# Patient Record
Sex: Female | Born: 1965 | Race: White | Hispanic: No | Marital: Married | Smoking: Former smoker
Health system: Southern US, Community
[De-identification: ages and names within clinical notes are randomized; demographics above are authoritative.]

## PROBLEM LIST (undated history)

## (undated) DIAGNOSIS — E0500 Thyrotoxicosis with diffuse goiter without thyrotoxic crisis or storm: Secondary | ICD-10-CM

## (undated) DIAGNOSIS — D649 Anemia, unspecified: Secondary | ICD-10-CM

## (undated) DIAGNOSIS — E039 Hypothyroidism, unspecified: Secondary | ICD-10-CM

## (undated) HISTORY — PX: OTHER SURGICAL HISTORY: SHX169

---

## 2002-07-07 ENCOUNTER — Emergency Department (HOSPITAL_COMMUNITY): Admission: EM | Admit: 2002-07-07 | Discharge: 2002-07-08 | Payer: Self-pay

## 2011-05-03 ENCOUNTER — Emergency Department (HOSPITAL_COMMUNITY)
Admission: EM | Admit: 2011-05-03 | Discharge: 2011-05-03 | Disposition: A | Discharge status: Home / Self Care | Payer: BC Managed Care – PPO | Attending: Emergency Medicine | Admitting: Emergency Medicine

## 2011-05-03 ENCOUNTER — Other Ambulatory Visit: Payer: Self-pay

## 2011-05-03 DIAGNOSIS — M25519 Pain in unspecified shoulder: Secondary | ICD-10-CM | POA: Insufficient documentation

## 2011-05-03 DIAGNOSIS — R51 Headache: Secondary | ICD-10-CM

## 2011-05-03 DIAGNOSIS — R001 Bradycardia, unspecified: Secondary | ICD-10-CM

## 2011-05-03 DIAGNOSIS — R0789 Other chest pain: Secondary | ICD-10-CM

## 2011-05-03 DIAGNOSIS — R071 Chest pain on breathing: Secondary | ICD-10-CM | POA: Insufficient documentation

## 2011-05-03 DIAGNOSIS — I498 Other specified cardiac arrhythmias: Secondary | ICD-10-CM | POA: Insufficient documentation

## 2011-05-03 LAB — CK TOTAL AND CKMB (NOT AT ARMC): Total CK: 27 U/L (ref 7–177)

## 2011-05-03 LAB — TROPONIN I: Troponin I: <0.3 ng/mL (ref <0.30)

## 2011-05-03 MED ORDER — ONDANSETRON HCL 4 MG/2ML IJ SOLN
4.0000 mg | Freq: Once | INTRAMUSCULAR | Status: AC
  Administered 2011-05-03: 4 mg via INTRAVENOUS
  Filled 2011-05-03: qty 2

## 2011-05-03 MED ORDER — DIPHENHYDRAMINE HCL 25 MG PO CAPS
25.0000 mg | ORAL_CAPSULE | Freq: Once | ORAL | Status: AC
  Administered 2011-05-03: 25 mg via ORAL
  Filled 2011-05-03: qty 1

## 2011-05-03 MED ORDER — SODIUM CHLORIDE 0.9 % IV SOLN
Freq: Once | INTRAVENOUS | Status: AC
  Administered 2011-05-03: 16:00:00 via INTRAVENOUS

## 2011-05-03 MED ORDER — HYDROMORPHONE HCL 2 MG/ML IJ SOLN
1.0000 mg | Freq: Once | INTRAMUSCULAR | Status: AC
  Administered 2011-05-03: 2 mg via INTRAVENOUS
  Filled 2011-05-03: qty 1

## 2011-05-03 NOTE — ED Provider Notes (Signed)
History     Chief Complaint  Patient presents with  . Headache  . Chest Pain  pt also has a frontal headache.  Onset after taking 2 days of prednisone taper.  Saw her PCP 2 days ago and was also prescribed diclofenac for "tendonitis" in her L shoulder.  Chest pain is worse with palpation, deep inspiration and movement. Patient is a 45 y.o. female presenting with headaches and chest pain. The history is provided by the patient. No language interpreter was used.  Headache  This is a new problem. The current episode started 2 days ago. The problem occurs constantly. The problem has not changed since onset.The headache is associated with nothing. The pain is located in the left unilateral region. The quality of the pain is described as dull. The pain is at a severity of 7/10. The pain radiates to the left arm.  Chest Pain     History reviewed. No pertinent past medical history.  Past Surgical History  Procedure Date  . Cesarean section     History reviewed. No pertinent family history.  History  Substance Use Topics  . Smoking status: Never Smoker   . Smokeless tobacco: Not on file  . Alcohol Use: No    OB History    Grav Para Term Preterm Abortions TAB SAB Ect Mult Living                  Review of Systems  Cardiovascular: Positive for chest pain.  Neurological: Positive for headaches.    Physical Exam  BP 186/75  Pulse 67  Temp(Src) 97.8 F (36.6 C) (Oral)  Resp 18  Ht 5\' 2"  (1.575 m)  Wt 136 lb (61.689 kg)  BMI 24.87 kg/m2  SpO2 100%  LMP 04/26/2011  Physical Exam  Constitutional: She is oriented to person, place, and time. She appears well-nourished. No distress.  HENT:  Head: Normocephalic and atraumatic.  Eyes: EOM are normal. Pupils are equal, round, and reactive to light. Right eye exhibits no discharge. Left eye exhibits no discharge.  Cardiovascular: Normal rate, regular rhythm, normal heart sounds and intact distal pulses.  Exam reveals no friction  rub.   No murmur heard.      Sinus bradycardia  Pulmonary/Chest: Effort normal and breath sounds normal. No respiratory distress. She has no wheezes. She has no rales. She exhibits no tenderness.       Pain L of sternum with deep inspiration, movement and palpation.  Neurological: She is alert and oriented to person, place, and time. She has normal reflexes. No cranial nerve deficit. Coordination normal.  Skin: She is not diaphoretic.    ED Course  Procedures  MDM   ED ECG REPORT   Date: 05/03/2011 1456  EKG Time Rate 54  Rhythm: sinus bradycardia   Axis:nl  Intervals:none  ST&T Change  none  Narrative Interpretation:   sinus bradycardia       Worthy Rancher, PA 05/03/11 1606  Worthy Rancher, PA 05/03/11 1626  Worthy Rancher, [place] 05/03/11 1845

## 2011-05-03 NOTE — ED Notes (Signed)
Pt presents with headache, chest pain, numbness in left side of face and arm. Pt recently stopped taking medication prescribed by PMD. No facial droop or weakness noted at this time.

## 2011-05-10 NOTE — ED Provider Notes (Signed)
Medical screening examination/treatment/procedure(s) were performed by non-physician practitioner and as supervising physician I was immediately available for consultation/collaboration. Jahziel Sinn, MD, FACEP   Aariya Ferrick L Charbel Los, MD 05/10/11 1712 

## 2011-12-05 ENCOUNTER — Other Ambulatory Visit (HOSPITAL_COMMUNITY): Payer: Self-pay | Admitting: Endocrinology

## 2011-12-05 DIAGNOSIS — E0500 Thyrotoxicosis with diffuse goiter without thyrotoxic crisis or storm: Secondary | ICD-10-CM

## 2011-12-12 ENCOUNTER — Ambulatory Visit (HOSPITAL_COMMUNITY)
Admission: RE | Admit: 2011-12-12 | Discharge: 2011-12-12 | Disposition: A | Discharge status: Home / Self Care | Payer: BC Managed Care – PPO | Source: Ambulatory Visit | Attending: Endocrinology | Admitting: Endocrinology

## 2011-12-12 DIAGNOSIS — E0500 Thyrotoxicosis with diffuse goiter without thyrotoxic crisis or storm: Secondary | ICD-10-CM

## 2011-12-12 DIAGNOSIS — E0590 Thyrotoxicosis, unspecified without thyrotoxic crisis or storm: Secondary | ICD-10-CM | POA: Insufficient documentation

## 2011-12-12 MED ORDER — SODIUM IODIDE I 131 CAPSULE
20.4000 | Freq: Once | INTRAVENOUS | Status: AC | PRN
  Administered 2011-12-12: 20.4 via ORAL

## 2013-06-28 ENCOUNTER — Emergency Department (HOSPITAL_COMMUNITY): Payer: Worker's Compensation

## 2013-06-28 ENCOUNTER — Encounter (HOSPITAL_COMMUNITY): Payer: Self-pay | Admitting: *Deleted

## 2013-06-28 ENCOUNTER — Emergency Department (HOSPITAL_COMMUNITY)
Admission: EM | Admit: 2013-06-28 | Discharge: 2013-06-28 | Disposition: A | Discharge status: Home / Self Care | Payer: Worker's Compensation | Attending: Emergency Medicine | Admitting: Emergency Medicine

## 2013-06-28 DIAGNOSIS — X58XXXA Exposure to other specified factors, initial encounter: Secondary | ICD-10-CM | POA: Insufficient documentation

## 2013-06-28 DIAGNOSIS — S9780XA Crushing injury of unspecified foot, initial encounter: Secondary | ICD-10-CM | POA: Insufficient documentation

## 2013-06-28 DIAGNOSIS — Z23 Encounter for immunization: Secondary | ICD-10-CM | POA: Insufficient documentation

## 2013-06-28 DIAGNOSIS — S91109A Unspecified open wound of unspecified toe(s) without damage to nail, initial encounter: Secondary | ICD-10-CM | POA: Insufficient documentation

## 2013-06-28 DIAGNOSIS — Y9289 Other specified places as the place of occurrence of the external cause: Secondary | ICD-10-CM | POA: Insufficient documentation

## 2013-06-28 DIAGNOSIS — Z79899 Other long term (current) drug therapy: Secondary | ICD-10-CM | POA: Insufficient documentation

## 2013-06-28 DIAGNOSIS — Y990 Civilian activity done for income or pay: Secondary | ICD-10-CM | POA: Insufficient documentation

## 2013-06-28 DIAGNOSIS — S91209A Unspecified open wound of unspecified toe(s) with damage to nail, initial encounter: Secondary | ICD-10-CM

## 2013-06-28 DIAGNOSIS — E0500 Thyrotoxicosis with diffuse goiter without thyrotoxic crisis or storm: Secondary | ICD-10-CM | POA: Insufficient documentation

## 2013-06-28 DIAGNOSIS — Y939 Activity, unspecified: Secondary | ICD-10-CM | POA: Insufficient documentation

## 2013-06-28 DIAGNOSIS — D649 Anemia, unspecified: Secondary | ICD-10-CM | POA: Insufficient documentation

## 2013-06-28 DIAGNOSIS — S9781XA Crushing injury of right foot, initial encounter: Secondary | ICD-10-CM

## 2013-06-28 HISTORY — DX: Thyrotoxicosis with diffuse goiter without thyrotoxic crisis or storm: E05.00

## 2013-06-28 HISTORY — DX: Anemia, unspecified: D64.9

## 2013-06-28 MED ORDER — HYDROCODONE-ACETAMINOPHEN 5-325 MG PO TABS: 1.0000 | ORAL_TABLET | ORAL | Status: DC | PRN

## 2013-06-28 MED ORDER — IBUPROFEN 600 MG PO TABS: 600.0000 mg | ORAL_TABLET | Freq: Four times a day (QID) | ORAL | Status: DC | PRN

## 2013-06-28 MED ORDER — TETANUS-DIPHTH-ACELL PERTUSSIS 5-2.5-18.5 LF-MCG/0.5 IM SUSP
0.5000 mL | Freq: Once | INTRAMUSCULAR | Status: AC
  Administered 2013-06-28: 0.5 mL via INTRAMUSCULAR
  Filled 2013-06-28: qty 0.5

## 2013-06-28 NOTE — ED Notes (Signed)
Pt had her right foot ran over by a pallet jack while at work this am, pt c/o pain to last three toes on right foot and top of right foot, good cap refill noted.

## 2013-07-03 NOTE — ED Provider Notes (Signed)
CSN: [id number]     Arrival date & time 06/28/13  0919 History   First MD Initiated Contact with Patient 06/28/13 1014     Chief Complaint  Patient presents with  . Foot Injury   (Consider location/radiation/quality/duration/timing/severity/associated sxs/prior Treatment) HPI Comments: Kristy Abbott is a 47 y.o. Female presenting with right foot injury and pain after a pallet jack ran over her foot at work this morning just prior to arrival.  The jack ran over the very tip of her foot, causing pain to her 3rd, 4th and 5th toes.  She also has injury to the nail plate of her 3rd toe.  She can wiggle her toes with worsening pain.  She denies other injury.  She has had no treatments prior to arrival.      The history is provided by the patient.    Past Medical History  Diagnosis Date  . Graves disease   . Anemia    Past Surgical History  Procedure Laterality Date  . Cesarean section     No family history on file. History  Substance Use Topics  . Smoking status: Never Smoker   . Smokeless tobacco: Not on file  . Alcohol Use: No   OB History   Grav Para Term Preterm Abortions TAB SAB Ect Mult Living                 Review of Systems  Constitutional: Negative for fever.  Musculoskeletal: Positive for arthralgias. Negative for myalgias and joint swelling.  Neurological: Negative for weakness and numbness.    Allergies  Review of patient's allergies indicates no known allergies.  Home Medications   Current Outpatient Rx  Name  Route  Sig  Dispense  Refill  . ferrous sulfate 325 (65 FE) MG tablet   Oral   Take 325 mg by mouth daily with breakfast.         . levothyroxine (SYNTHROID, LEVOTHROID) 100 MCG tablet   Oral   Take 100 mcg by mouth daily before breakfast.         . HYDROcodone-acetaminophen (NORCO/VICODIN) 5-325 MG per tablet   Oral   Take 1 tablet by mouth every 4 (four) hours as needed for pain.   15 tablet   0   . ibuprofen (ADVIL,MOTRIN) 600 MG  tablet   Oral   Take 1 tablet (600 mg total) by mouth every 6 (six) hours as needed for pain.   20 tablet   0    BP 151/81  Pulse 72  Temp(Src) 99.2 F (37.3 C) (Oral)  Resp 20  Ht 5\' 2"  (1.575 m)  Wt 145 lb (65.772 kg)  BMI 26.51 kg/m2  SpO2 98%  LMP 06/28/2013 Physical Exam  Constitutional: She appears well-developed and well-nourished.  HENT:  Head: Atraumatic.  Neck: Normal range of motion.  Cardiovascular:  Pulses:      Dorsalis pedis pulses are 2+ on the right side, and 2+ on the left side.  Pulses equal bilaterally  Musculoskeletal: She exhibits tenderness. She exhibits no edema.       Feet:  Neurological: She is alert. She has normal strength. She displays normal reflexes. No sensory deficit.  Equal strength  Skin: Skin is warm and dry.  3rd right toe nail plate completely avulsed,  Hanging by a small flap of cuticle.  Hemostatic. No laceration to nail bed. Less than 3 sec cap refill.  Psychiatric: She has a normal mood and affect.    ED Course  Procedures (  including critical care time)  Nail plate was removed without difficulty.   Nail bed was covered with xeroform after cleansing with wound spray,  Saline.  Dressing applied.   Labs Review Labs Reviewed - No data to display Imaging Review No results found.  MDM   1. Crush injury to foot, right, initial encounter   2. Nail avulsion of toe, initial encounter    Post op shoe,  Bulky dressing applied.  Hydrocodone, ibuprofen, ice, elevation encouraged for next few days.  Pt's tetanus was also updated.  Prn f/u with pcp recommended.  Patients labs and/or radiological studies were viewed and considered during the medical decision making and disposition process.     Burgess Amor, PA-C 07/03/13 [phone]

## 2013-07-04 NOTE — ED Provider Notes (Signed)
Medical screening examination/treatment/procedure(s) were performed by non-physician practitioner and as supervising physician I was immediately available for consultation/collaboration.   Benny Lennert, MD 07/04/13 1325

## 2014-01-19 ENCOUNTER — Emergency Department (HOSPITAL_COMMUNITY)
Admission: EM | Admit: 2014-01-19 | Discharge: 2014-01-19 | Disposition: A | Discharge status: Home / Self Care | Payer: BC Managed Care – PPO | Attending: Emergency Medicine | Admitting: Emergency Medicine

## 2014-01-19 ENCOUNTER — Emergency Department (HOSPITAL_COMMUNITY): Payer: BC Managed Care – PPO

## 2014-01-19 ENCOUNTER — Encounter (HOSPITAL_COMMUNITY): Payer: Self-pay | Admitting: Emergency Medicine

## 2014-01-19 DIAGNOSIS — R0602 Shortness of breath: Secondary | ICD-10-CM | POA: Insufficient documentation

## 2014-01-19 DIAGNOSIS — Z862 Personal history of diseases of the blood and blood-forming organs and certain disorders involving the immune mechanism: Secondary | ICD-10-CM | POA: Insufficient documentation

## 2014-01-19 DIAGNOSIS — Z792 Long term (current) use of antibiotics: Secondary | ICD-10-CM | POA: Insufficient documentation

## 2014-01-19 DIAGNOSIS — Z8619 Personal history of other infectious and parasitic diseases: Secondary | ICD-10-CM | POA: Insufficient documentation

## 2014-01-19 DIAGNOSIS — R51 Headache: Secondary | ICD-10-CM | POA: Insufficient documentation

## 2014-01-19 DIAGNOSIS — F419 Anxiety disorder, unspecified: Secondary | ICD-10-CM

## 2014-01-19 DIAGNOSIS — F411 Generalized anxiety disorder: Secondary | ICD-10-CM | POA: Insufficient documentation

## 2014-01-19 LAB — CBC WITH DIFFERENTIAL/PLATELET
BASOS PCT: 0 % (ref 0–1)
Basophils Absolute: 0 10*3/uL (ref 0.0–0.1)
EOS ABS: 0.1 10*3/uL (ref 0.0–0.7)
Eosinophils Relative: 1 % (ref 0–5)
HEMATOCRIT: 32.6 % — AB (ref 36.0–46.0)
HEMOGLOBIN: 10.3 g/dL — AB (ref 12.0–15.0)
LYMPHS ABS: 2.2 10*3/uL (ref 0.7–4.0)
Lymphocytes Relative: 32 % (ref 12–46)
MCH: 25.1 pg — AB (ref 26.0–34.0)
MCHC: 31.6 g/dL (ref 30.0–36.0)
MCV: 79.5 fL (ref 78.0–100.0)
MONO ABS: 0.4 10*3/uL (ref 0.1–1.0)
MONOS PCT: 5 % (ref 3–12)
Neutro Abs: 4.2 10*3/uL (ref 1.7–7.7)
Neutrophils Relative %: 62 % (ref 43–77)
Platelets: 315 10*3/uL (ref 150–400)
RBC: 4.1 MIL/uL (ref 3.87–5.11)
RDW: 15 % (ref 11.5–15.5)
WBC: 6.8 10*3/uL (ref 4.0–10.5)

## 2014-01-19 LAB — COMPREHENSIVE METABOLIC PANEL
ALBUMIN: 4.8 g/dL (ref 3.5–5.2)
ALK PHOS: 69 U/L (ref 39–117)
ALT: 10 U/L (ref 0–35)
AST: 16 U/L (ref 0–37)
BILIRUBIN TOTAL: 0.4 mg/dL (ref 0.3–1.2)
BUN: 9 mg/dL (ref 6–23)
CO2: 24 mEq/L (ref 19–32)
CREATININE: 0.56 mg/dL (ref 0.50–1.10)
Calcium: 10.1 mg/dL (ref 8.4–10.5)
Chloride: 104 mEq/L (ref 96–112)
GFR calc non Af Amer: >90 mL/min (ref >90)
GLUCOSE: 95 mg/dL (ref 70–99)
Potassium: 4.4 mEq/L (ref 3.7–5.3)
Sodium: 141 mEq/L (ref 137–147)
TOTAL PROTEIN: 9 g/dL — AB (ref 6.0–8.3)

## 2014-01-19 LAB — TROPONIN I

## 2014-01-19 MED ORDER — LORAZEPAM 0.5 MG PO TABS: 1.0000 mg | ORAL_TABLET | Freq: Three times a day (TID) | ORAL | Status: DC | PRN

## 2014-01-19 MED ORDER — LORAZEPAM 2 MG/ML IJ SOLN
0.5000 mg | Freq: Once | INTRAMUSCULAR | Status: AC
  Administered 2014-01-19: 0.5 mg via INTRAVENOUS
  Filled 2014-01-19: qty 1

## 2014-01-19 MED ORDER — SODIUM CHLORIDE 0.9 % IV BOLUS (SEPSIS)
1000.0000 mL | Freq: Once | INTRAVENOUS | Status: AC
  Administered 2014-01-19: 1000 mL via INTRAVENOUS

## 2014-01-19 NOTE — ED Notes (Signed)
Stills c/o mild dizziness

## 2014-01-19 NOTE — ED Notes (Signed)
Pt c/o ha/dizziness and sob since 10 am.

## 2014-01-19 NOTE — ED Provider Notes (Signed)
CSN: [id number]     Arrival date & time 01/19/14  1746 History  This chart was scribed for Benny Lennert, MD by Ardelia Mems, ED Scribe. This patient was seen in room [facility] and the patient's care was started at 7:11 PM.   Chief Complaint  Patient presents with  . Dizziness    Patient is a 48 y.o. female presenting with dizziness. The history is provided by the patient. No language interpreter was used.  Dizziness Quality:  Lightheadedness Severity:  Moderate Onset quality:  Gradual Duration:  9 hours Timing:  Intermittent Progression:  Waxing and waning Chronicity:  New Relieved by:  None tried Worsened by:  Nothing tried Ineffective treatments:  None tried Associated symptoms: headaches and shortness of breath   Associated symptoms: no chest pain and no diarrhea   Risk factors: anemia     HPI Comments: Kristy Abbott is a 48 y.o. Female with a history of anemia and Grave's disease who presents to the Emergency Department complaining of intermittent dizziness onset at 10 AM this morning. She reports associated lightheadedness, headache and SOB onset this morning. She denies abdominal pain or any other pain or symptoms.   PCP- Dr. [name]   Past Medical History  Diagnosis Date  . Graves disease   . Anemia    Past Surgical History  Procedure Laterality Date  . Cesarean section     No family history on file. History  Substance Use Topics  . Smoking status: Never Smoker   . Smokeless tobacco: Not on file  . Alcohol Use: No   OB History   Grav Para Term Preterm Abortions TAB SAB Ect Mult Living                 Review of Systems  Constitutional: Negative for appetite change and fatigue.  HENT: Negative for congestion, ear discharge and sinus pressure.   Eyes: Negative for discharge.  Respiratory: Positive for shortness of breath. Negative for cough.   Cardiovascular: Negative for chest pain.  Gastrointestinal: Negative for abdominal pain and diarrhea.   Genitourinary: Negative for frequency and hematuria.  Musculoskeletal: Negative for back pain.  Skin: Negative for rash.  Neurological: Positive for dizziness, light-headedness and headaches. Negative for seizures.  Psychiatric/Behavioral: Negative for hallucinations.    Allergies  Review of patient's allergies indicates no known allergies.  Home Medications   Current Outpatient Rx  Name  Route  Sig  Dispense  Refill  . levothyroxine (SYNTHROID, LEVOTHROID) 75 MCG tablet   Oral   Take 75 mcg by mouth daily before breakfast.          Triage Vitals: BP 149/63  Pulse 72  Temp(Src) 98.5 F (36.9 C)  Resp 24  Ht 5\' 2"  (1.575 m)  Wt 135 lb (61.236 kg)  BMI 24.69 kg/m2  SpO2 100%  LMP 01/01/2014  Physical Exam  Constitutional: She is oriented to person, place, and time. She appears well-developed.  HENT:  Head: Normocephalic.  Eyes: Conjunctivae and EOM are normal. No scleral icterus.  Neck: Neck supple. No thyromegaly present.  Cardiovascular: Normal rate and regular rhythm.  Exam reveals no gallop and no friction rub.   No murmur heard. Pulmonary/Chest: No stridor. She has no wheezes. She has no rales. She exhibits no tenderness.  Abdominal: She exhibits no distension. There is no tenderness. There is no rebound.  Musculoskeletal: Normal range of motion. She exhibits no edema.  Lymphadenopathy:    She has no cervical adenopathy.  Neurological: She  is oriented to person, place, and time. She exhibits normal muscle tone. Coordination normal.  Skin: No rash noted. No erythema.  Psychiatric:  Slightly anxious.    ED Course  Procedures (including critical care time)  DIAGNOSTIC STUDIES: Oxygen Saturation is 100% on RA, normal by my interpretation.    COORDINATION OF CARE: 7:14 PM- Discussed plan to obtain diagnostic lab work and radiology. Will also order IV fluids and Ativan. Pt advised of plan for treatment and pt agrees.  Medications  sodium chloride 0.9 %  bolus 1,000 mL (1,000 mLs Intravenous New Bag/Given 01/19/14 1925)  LORazepam (ATIVAN) injection 0.5 mg (0.5 mg Intravenous Given 01/19/14 1925)   Labs Review Labs Reviewed  CBC WITH DIFFERENTIAL - Abnormal; Notable for the following:    Hemoglobin 10.3 (*)    HCT 32.6 (*)    MCH 25.1 (*)    All other components within normal limits  COMPREHENSIVE METABOLIC PANEL - Abnormal; Notable for the following:    Total Protein 9.0 (*)    All other components within normal limits  TROPONIN I   Imaging Review Dg Chest 2 View  01/19/2014   CLINICAL DATA:  Chest pain and weakness  EXAM: CHEST  2 VIEW  COMPARISON:  None.  FINDINGS: The lungs are clear and negative for focal airspace consolidation, pulmonary edema or suspicious pulmonary nodule. No pleural effusion or pneumothorax. Cardiac and mediastinal contours are within normal limits. No acute fracture or lytic or blastic osseous lesions. The visualized upper abdominal bowel gas pattern is unremarkable.  IMPRESSION: No active cardiopulmonary disease.   Electronically Signed   By: Malachy MoanHeath  McCullough M.D.   On: 01/19/2014 20:03     EKG Interpretation   Date/Time:  Thursday January 19 2014 18:02:03 EDT Ventricular Rate:  52 PR Interval:  146 QRS Duration: 84 QT Interval:  414 QTC Calculation: 385 R Axis:   74 Text Interpretation:  6 Sinus bradycardia Otherwise normal ECG No previous  ECGs available Confirmed by Crosby Oriordan  MD, Tyanna Hach [phone]) on 01/19/2014 9:03:27  PM      MDM   Final diagnoses:  None      Benny LennertJoseph L Nariah Morgano, MD 01/19/14 2103

## 2014-01-19 NOTE — Discharge Instructions (Signed)
Follow up with your md next week for recheck °

## 2018-07-15 DIAGNOSIS — E663 Overweight: Secondary | ICD-10-CM | POA: Diagnosis not present

## 2018-08-31 DIAGNOSIS — Z6827 Body mass index (BMI) 27.0-27.9, adult: Secondary | ICD-10-CM | POA: Diagnosis not present

## 2018-08-31 DIAGNOSIS — Z0000 Encounter for general adult medical examination without abnormal findings: Secondary | ICD-10-CM | POA: Diagnosis not present

## 2018-08-31 DIAGNOSIS — E039 Hypothyroidism, unspecified: Secondary | ICD-10-CM | POA: Diagnosis not present

## 2018-08-31 DIAGNOSIS — J069 Acute upper respiratory infection, unspecified: Secondary | ICD-10-CM | POA: Diagnosis not present

## 2018-08-31 DIAGNOSIS — D649 Anemia, unspecified: Secondary | ICD-10-CM | POA: Diagnosis not present

## 2018-08-31 DIAGNOSIS — Z79899 Other long term (current) drug therapy: Secondary | ICD-10-CM | POA: Diagnosis not present

## 2018-08-31 DIAGNOSIS — Z299 Encounter for prophylactic measures, unspecified: Secondary | ICD-10-CM | POA: Diagnosis not present

## 2018-09-20 ENCOUNTER — Ambulatory Visit (INDEPENDENT_AMBULATORY_CARE_PROVIDER_SITE_OTHER): Payer: Self-pay

## 2018-09-20 DIAGNOSIS — Z1211 Encounter for screening for malignant neoplasm of colon: Secondary | ICD-10-CM

## 2018-09-20 MED ORDER — NA SULFATE-K SULFATE-MG SULF 17.5-3.13-1.6 GM/177ML PO SOLN: 1.0000 | ORAL | 0 refills | Status: DC

## 2018-09-20 NOTE — Progress Notes (Signed)
Gastroenterology Pre-Procedure Review  Request Date:09/20/18 Requesting Physician: Salley SlaughterAngela Boone NP- Dr.Shah- no previous tcs  PATIENT REVIEW QUESTIONS: The patient responded to the following health history questions as indicated:    Pt is taking iron every other day.   1. Diabetes Melitis: no 2. Joint replacements in the past 12 months: no 3. Major health problems in the past 3 months: no 4. Has an artificial valve or MVP: no 5. Has a defibrillator: no 6. Has been advised in past to take antibiotics in advance of a procedure like teeth cleaning: no 7. Family history of colon cancer: no  8. Alcohol Use: no 9. History of sleep apnea: no  10. History of coronary artery or other vascular stents placed within the last 12 months: no 11. History of any prior anesthesia complications: no    MEDICATIONS & ALLERGIES:    Patient reports the following regarding taking any blood thinners:   Plavix? no Aspirin? no Coumadin? no Brilinta? no Xarelto? no Eliquis? no Pradaxa? no Savaysa? no Effient? no  Patient confirms/reports the following medications:  Current Outpatient Medications  Medication Sig Dispense Refill  . Ferrous Sulfate (SLOW FE PO) Take by mouth every other day.    . levothyroxine (SYNTHROID, LEVOTHROID) 75 MCG tablet Take 88 mcg by mouth daily before breakfast.     . omeprazole (PRILOSEC) 20 MG capsule TK ONE C PO  BID  3   No current facility-administered medications for this visit.     Patient confirms/reports the following allergies:  No Known Allergies  No orders of the defined types were placed in this encounter.   AUTHORIZATION INFORMATION Primary Insurance: [employer],  ID #: [id number] Pre-Cert / Berkley HarveyAuth required:  Pre-Cert / Auth #:    SCHEDULE INFORMATION: Procedure has been scheduled as follows:  Date: 11/10/18, Time: 1:45 Location: [facility]  This Gastroenterology Pre-Precedure Review Form is being routed to the following provider(s): Wynne DustEric Gill NP

## 2018-09-20 NOTE — Patient Instructions (Signed)
Kristy Abbott  September 28, 1966 MRN: [id number]     Procedure Date: 11/10/18 Time to register: 12:30pm Place to register: [facility] Procedure Time: 1:30pm Scheduled provider: Barney Drain, MD    PREPARATION FOR COLONOSCOPY WITH SUPREP BOWEL PREP KIT  Note: Suprep Bowel Prep Kit is a split-dose (2day) regimen. Consumption of BOTH 6-ounce bottles is required for a complete prep.  Please notify us immediately if you are diabetic, take iron supplements, or if you are on Coumadin or any other blood thinners.  Please hold the following medications: I will mail you a letter with this information                                                                                                                                                   1 DAY BEFORE PROCEDURE:  DATE: 11/09/18   DAY: Tuesday  clear liquids the entire day - NO SOLID FOOD.   At 6:00pm: Complete steps 1 through 4 below, using ONE (1) 6-ounce bottle, before going to bed. Step 1:  Pour ONE (1) 6-ounce bottle of SUPREP liquid into the mixing container.  Step 2:  Add cool drinking water to the 16 ounce line on the container and mix.  Note: Dilute the solution concentrate as directed prior to use. Step 3:  DRINK ALL the liquid in the container. Step 4:  You MUST drink an additional two (2) or more 16 ounce containers of water over the next one (1) hour.   Continue clear liquids.  DAY OF PROCEDURE:   DATE: 11/10/18  DAY: Wednesday If you take medications for your heart, blood pressure, or breathing, you may take these medications.   5 hours before your procedure at :8:30am Step 1:  Pour ONE (1) 6-ounce bottle of SUPREP liquid into the mixing container.  Step 2:  Add cool drinking water to the 16 ounce line on the container and mix.  Note: Dilute the solution concentrate as directed prior to use. Step 3:  DRINK ALL the liquid in the container. Step 4:  You MUST drink an additional two (2) or more 16 ounce containers  of water over the next one (1) hour. You MUST complete the final glass of water at least 3 hours before your colonoscopy.   Nothing by mouth past:10:30am  You may take your morning medications with sip of water unless we have instructed otherwise.    Please see below for Dietary Information.  CLEAR LIQUIDS INCLUDE:  Water Jello (NOT red in color)   Ice Popsicles (NOT red in color)   Tea (sugar ok, no milk/cream) Powdered fruit flavored drinks  Coffee (sugar ok, no milk/cream) Gatorade/ Lemonade/ Kool-Aid  (NOT red in color)   Juice: apple, white grape, white cranberry Soft drinks  Clear bullion, consomme, broth (fat free beef/chicken/vegetable)  Carbonated beverages (any kind)  Strained chicken noodle soup Hard Candy   Remember: Clear liquids are liquids that will allow you to see your fingers on the other side of a clear glass. Be sure liquids are NOT red in color, and not cloudy, but CLEAR.  DO NOT EAT OR DRINK ANY OF THE FOLLOWING:  Dairy products of any kind   Cranberry juice Tomato juice / V8 juice   Grapefruit juice Orange juice     Red grape juice  Do not eat any solid foods, including such foods as: cereal, oatmeal, yogurt, fruits, vegetables, creamed soups, eggs, bread, crackers, pureed foods in a blender, etc.   HELPFUL HINTS FOR DRINKING PREP SOLUTION:   Make sure prep is extremely cold. Mix and refrigerate the the morning of the prep. You may also put in the freezer.   You may try mixing some Crystal Light or Country Time Lemonade if you prefer. Mix in small amounts; add more if necessary.  Try drinking through a straw  Rinse mouth with water or a mouthwash between glasses, to remove after-taste.  Try sipping on a cold beverage /ice/ popsicles between glasses of prep.  Place a piece of sugar-free hard candy in mouth between glasses.  If you become nauseated, try consuming smaller amounts, or stretch out the time between glasses. Stop for 30-60 minutes, then  slowly start back drinking.     OTHER INSTRUCTIONS  You will need a responsible adult at least 50 years of age to accompany you and drive you home. This person must remain in the waiting room during your procedure. The hospital will cancel your procedure if you do not have a responsible adult with you.   1. Wear loose fitting clothing that is easily removed. 2. Leave jewelry and other valuables at home.  3. Remove all body piercing jewelry and leave at home. 4. Total time from sign-in until discharge is approximately 2-3 hours. 5. You should go home directly after your procedure and rest. You can resume normal activities the day after your procedure. 6. The day of your procedure you should not:  Drive  Make legal decisions  Operate machinery  Drink alcohol  Return to work   You may call the office (Dept: [phone]) before 5:00pm, or page the doctor on call [phone]) after 5:00pm, for further instructions, if necessary.   Insurance Information YOU WILL NEED TO CHECK WITH YOUR INSURANCE COMPANY FOR THE BENEFITS OF COVERAGE YOU HAVE FOR THIS PROCEDURE.  UNFORTUNATELY, NOT ALL INSURANCE COMPANIES HAVE BENEFITS TO COVER ALL OR PART OF THESE TYPES OF PROCEDURES.  IT IS YOUR RESPONSIBILITY TO CHECK YOUR BENEFITS, HOWEVER, WE WILL BE GLAD TO ASSIST YOU WITH ANY CODES YOUR INSURANCE COMPANY MAY NEED.    PLEASE NOTE THAT MOST INSURANCE COMPANIES WILL NOT COVER A SCREENING COLONOSCOPY FOR PEOPLE UNDER THE AGE OF 50  IF YOU HAVE BCBS INSURANCE, YOU MAY HAVE BENEFITS FOR A SCREENING COLONOSCOPY BUT IF POLYPS ARE FOUND THE DIAGNOSIS WILL CHANGE AND THEN YOU MAY HAVE A DEDUCTIBLE THAT WILL NEED TO BE MET. SO PLEASE MAKE SURE YOU CHECK YOUR BENEFITS FOR A SCREENING COLONOSCOPY AS WELL AS A DIAGNOSTIC COLONOSCOPY.

## 2018-09-21 NOTE — Progress Notes (Signed)
Ok to schedule. Hold iron for 7 days prior to TCS

## 2018-09-22 NOTE — Progress Notes (Signed)
Letter mailed to the pt to hold iron.

## 2018-09-28 DIAGNOSIS — Z1231 Encounter for screening mammogram for malignant neoplasm of breast: Secondary | ICD-10-CM | POA: Diagnosis not present

## 2018-10-19 DIAGNOSIS — E663 Overweight: Secondary | ICD-10-CM | POA: Diagnosis not present

## 2018-11-09 ENCOUNTER — Telehealth: Payer: Self-pay | Admitting: Gastroenterology

## 2018-11-09 NOTE — Telephone Encounter (Signed)
I spoke with Marylu Lund. We are working with [facility] to gets pts tcs approved.

## 2018-11-09 NOTE — Telephone Encounter (Signed)
Please call Marylu Lund at [facility] about patient and her procedure tomorrow. [phone]

## 2018-11-09 NOTE — Telephone Encounter (Signed)
[facility] and speaking with Dorothea GlassmanCarrie, Karen, [place] and finally Toney ReilDaisy and speaking with Marylu LundJanet at [facility] several times, the patients tcs has been approved. Approval number per Toney Reilaisy- [id number]- [id number]. I called and informed the pt.

## 2018-11-10 ENCOUNTER — Encounter (HOSPITAL_COMMUNITY): Payer: Self-pay | Admitting: *Deleted

## 2018-11-10 ENCOUNTER — Encounter (HOSPITAL_COMMUNITY)
Admission: RE | Disposition: A | Discharge status: Home / Self Care | Payer: Self-pay | Source: Home / Self Care | Attending: Gastroenterology

## 2018-11-10 ENCOUNTER — Other Ambulatory Visit: Payer: Self-pay

## 2018-11-10 ENCOUNTER — Ambulatory Visit (HOSPITAL_COMMUNITY)
Admission: RE | Admit: 2018-11-10 | Discharge: 2018-11-10 | Disposition: A | Discharge status: Home / Self Care | Payer: 59 | Attending: Gastroenterology | Admitting: Gastroenterology

## 2018-11-10 DIAGNOSIS — Z791 Long term (current) use of non-steroidal anti-inflammatories (NSAID): Secondary | ICD-10-CM | POA: Diagnosis not present

## 2018-11-10 DIAGNOSIS — K648 Other hemorrhoids: Secondary | ICD-10-CM | POA: Insufficient documentation

## 2018-11-10 DIAGNOSIS — D649 Anemia, unspecified: Secondary | ICD-10-CM | POA: Insufficient documentation

## 2018-11-10 DIAGNOSIS — Z1211 Encounter for screening for malignant neoplasm of colon: Secondary | ICD-10-CM | POA: Diagnosis not present

## 2018-11-10 DIAGNOSIS — K644 Residual hemorrhoidal skin tags: Secondary | ICD-10-CM | POA: Diagnosis not present

## 2018-11-10 DIAGNOSIS — Z79890 Hormone replacement therapy (postmenopausal): Secondary | ICD-10-CM | POA: Diagnosis not present

## 2018-11-10 DIAGNOSIS — Q438 Other specified congenital malformations of intestine: Secondary | ICD-10-CM | POA: Diagnosis not present

## 2018-11-10 DIAGNOSIS — Z79899 Other long term (current) drug therapy: Secondary | ICD-10-CM | POA: Diagnosis not present

## 2018-11-10 DIAGNOSIS — E039 Hypothyroidism, unspecified: Secondary | ICD-10-CM | POA: Insufficient documentation

## 2018-11-10 HISTORY — PX: COLONOSCOPY: SHX5424

## 2018-11-10 HISTORY — DX: Hypothyroidism, unspecified: E03.9

## 2018-11-10 SURGERY — COLONOSCOPY
Anesthesia: Moderate Sedation

## 2018-11-10 MED ORDER — STERILE WATER FOR IRRIGATION IR SOLN
Status: DC | PRN
  Administered 2018-11-10: 1.5 mL

## 2018-11-10 MED ORDER — MEPERIDINE HCL 100 MG/ML IJ SOLN
INTRAMUSCULAR | Status: DC | PRN
  Administered 2018-11-10 (×3): 25 mg via INTRAVENOUS

## 2018-11-10 MED ORDER — MIDAZOLAM HCL 5 MG/5ML IJ SOLN
INTRAMUSCULAR | Status: DC | PRN
  Administered 2018-11-10 (×3): 2 mg via INTRAVENOUS

## 2018-11-10 MED ORDER — SODIUM CHLORIDE 0.9 % IV SOLN
INTRAVENOUS | Status: DC
  Administered 2018-11-10: 13:00:00 via INTRAVENOUS

## 2018-11-10 MED ORDER — MIDAZOLAM HCL 5 MG/5ML IJ SOLN
INTRAMUSCULAR | Status: AC
  Filled 2018-11-10: qty 10

## 2018-11-10 MED ORDER — MEPERIDINE HCL 100 MG/ML IJ SOLN
INTRAMUSCULAR | Status: AC
  Filled 2018-11-10: qty 2

## 2018-11-10 NOTE — H&P (Signed)
Primary Care Physician:  Monico Blitz, MD Primary Gastroenterologist:  Dr. Oneida Alar  Pre-Procedure History & Physical: HPI:  Kristy Abbott is a 53 y.o. female here for  [place].   Past Medical History:  Diagnosis Date  . Anemia   . Graves disease   . Hypothyroidism     Past Surgical History:  Procedure Laterality Date  . CESAREAN SECTION    . Cyst removed from neck      Prior to Admission medications   Medication Sig Start Date End Date Taking? Authorizing Provider  levothyroxine (SYNTHROID, LEVOTHROID) 88 MCG tablet Take 88 mcg by mouth daily before breakfast.   Yes [provider]  Na Sulfate-K Sulfate-Mg Sulf (SUPREP BOWEL PREP KIT) 17.5-3.13-1.6 GM/177ML SOLN Take 1 kit by mouth as directed. 09/20/18  Yes Carlis Stable, NP  naproxen sodium (ALEVE) 220 MG tablet Take 440 mg by mouth daily as needed (pain).   Yes [provider]  omeprazole (PRILOSEC) 20 MG capsule Take 20 mg by mouth daily.  08/31/18  Yes [provider]  Ferrous Sulfate (IRON SLOW RELEASE) 142 (45 Fe) MG TBCR Take 45 mg by mouth every other day.    [provider]    Allergies as of 09/20/2018  . (No Known Allergies)    Family History  Problem Relation Age of Onset  . Colon cancer Neg Hx     Social History   Socioeconomic History  . Marital status: Married    Spouse name: Not on file  . Number of children: Not on file  . Years of education: Not on file  . Highest education level: Not on file  Occupational History  . Not on file  Social Needs  . Financial resource strain: Not on file  . Food insecurity:    Worry: Not on file    Inability: Not on file  . Transportation needs:    Medical: Not on file    Non-medical: Not on file  Tobacco Use  . Smoking status: Never Smoker  . Smokeless tobacco: Never Used  Substance and Sexual Activity  . Alcohol use: No  . Drug use: No  . Sexual activity: Yes  Lifestyle  . Physical activity:    Days  per week: Not on file    Minutes per session: Not on file  . Stress: Not on file  Relationships  . Social connections:    Talks on phone: Not on file    Gets together: Not on file    Attends religious service: Not on file    Active member of club or organization: Not on file    Attends meetings of clubs or organizations: Not on file    Relationship status: Not on file  . Intimate partner violence:    Fear of current or ex partner: Not on file    Emotionally abused: Not on file    Physically abused: Not on file    Forced sexual activity: Not on file  Other Topics Concern  . Not on file  Social History Narrative  . Not on file    Review of Systems: See HPI, otherwise negative ROS   Physical Exam: BP 136/83   Pulse 64   Temp 98.7 F (37.1 C) (Oral)   Resp 12   Ht 5' 4"  (1.626 m)   Wt 70.3 kg   LMP 11/04/2018 (Exact Date)   SpO2 100%   BMI 26.61 kg/m  General:   Alert,  pleasant and cooperative  in NAD Head:  Normocephalic and atraumatic. Neck:  Supple; Lungs:  Clear throughout to auscultation.    Heart:  Regular rate and rhythm. Abdomen:  Soft, nontender and nondistended. Normal bowel sounds, without guarding, and without rebound.   Neurologic:  Alert and  oriented x4;  grossly normal neurologically.  Impression/Plan:    SCREENING  Plan:  1. TCS TODAY DISCUSSED PROCEDURE, BENEFITS, & RISKS: < 1% chance of medication reaction, bleeding, perforation, or rupture of spleen/liver.

## 2018-11-10 NOTE — Discharge Instructions (Signed)
You have SMAL internal hemorrhoids. YOU DID NOT HAVE ANY POLYPS.   DRINK WATER TO KEEP YOUR URINE LIGHT YELLOW.  FOLLOW A HIGH FIBER DIET. AVOID ITEMS THAT CAUSE BLOATING. SEE INFO BELOW.  USE PREPARATION H FOUR TIMES  A DAY IF NEEDED TO RELIEVE RECTAL PAIN/PRESSURE/BLEEDING.  Next colonoscopy in 10 years.  Colonoscopy Care After Read the instructions outlined below and refer to this sheet in the next week. These discharge instructions provide you with general information on caring for yourself after you leave the hospital. While your treatment has been planned according to the most current medical practices available, unavoidable complications occasionally occur. If you have any problems or questions after discharge, call DR. Asia Dusenbury, [phone].  ACTIVITY  You may resume your regular activity, but move at a slower pace for the next 24 hours.   Take frequent rest periods for the next 24 hours.   Walking will help get rid of the air and reduce the bloated feeling in your belly (abdomen).   No driving for 24 hours (because of the medicine (anesthesia) used during the test).   You may shower.   Do not sign any important legal documents or operate any machinery for 24 hours (because of the anesthesia used during the test).    NUTRITION  Drink plenty of fluids.   You may resume your normal diet as instructed by your doctor.   Begin with a light meal and progress to your normal diet. Heavy or fried foods are harder to digest and may make you feel sick to your stomach (nauseated).   Avoid alcoholic beverages for 24 hours or as instructed.    MEDICATIONS  You may resume your normal medications.   WHAT YOU CAN EXPECT TODAY  Some feelings of bloating in the abdomen.   Passage of more gas than usual.   Spotting of blood in your stool or on the toilet paper  .  IF YOU HAD POLYPS REMOVED DURING THE COLONOSCOPY:  Eat a soft diet IF YOU HAVE NAUSEA, BLOATING, ABDOMINAL  PAIN, OR VOMITING.    FINDING OUT THE RESULTS OF YOUR TEST Not all test results are available during your visit. DR. Darrick Penna WILL CALL YOU WITHIN 14 DAYS OF YOUR PROCEDUE WITH YOUR RESULTS. Do not assume everything is normal if you have not heard from DR. Jovanny Stephanie, CALL HER OFFICE AT [phone].  SEEK IMMEDIATE MEDICAL ATTENTION AND CALL THE OFFICE: [phone] IF:  You have more than a spotting of blood in your stool.   Your belly is swollen (abdominal distention).   You are nauseated or vomiting.   You have a temperature over 101F.   You have abdominal pain or discomfort that is severe or gets worse throughout the day.  High-Fiber Diet A high-fiber diet changes your normal diet to include more whole grains, legumes, fruits, and vegetables. Changes in the diet involve replacing refined carbohydrates with unrefined foods. The calorie level of the diet is essentially unchanged. The Dietary Reference Intake (recommended amount) for adult males is 38 grams per day. For adult females, it is 25 grams per day. Pregnant and lactating women should consume 28 grams of fiber per day. Fiber is the intact part of a plant that is not broken down during digestion. Functional fiber is fiber that has been isolated from the plant to provide a beneficial effect in the body.  PURPOSE  Increase stool bulk.   Ease and regulate bowel movements.   Lower cholesterol.   REDUCE RISK OF COLON  CANCER  INDICATIONS THAT YOU NEED MORE FIBER  Constipation and hemorrhoids.   Uncomplicated diverticulosis (intestine condition) and irritable bowel syndrome.   Weight management.   As a protective measure against hardening of the arteries (atherosclerosis), diabetes, and cancer.   GUIDELINES FOR INCREASING FIBER IN THE DIET  Start adding fiber to the diet slowly. A gradual increase of about 5 more grams (2 slices of whole-wheat bread, 2 servings of most fruits or vegetables, or 1 bowl of high-fiber cereal) per  day is best. Too rapid an increase in fiber may result in constipation, flatulence, and bloating.   Drink enough water and fluids to keep your urine clear or pale yellow. Water, juice, or caffeine-free drinks are recommended. Not drinking enough fluid may cause constipation.   Eat a variety of high-fiber foods rather than one type of fiber.   Try to increase your intake of fiber through using high-fiber foods rather than fiber pills or supplements that contain small amounts of fiber.   The goal is to change the types of food eaten. Do not supplement your present diet with high-fiber foods, but replace foods in your present diet.    INCLUDE A VARIETY OF FIBER SOURCES  Replace refined and processed grains with whole grains, canned fruits with fresh fruits, and incorporate other fiber sources. White rice, white breads, and most bakery goods contain little or no fiber.   Brown whole-grain rice, buckwheat oats, and many fruits and vegetables are all good sources of fiber. These include: broccoli, Brussels sprouts, cabbage, cauliflower, beets, sweet potatoes, white potatoes (skin on), carrots, tomatoes, eggplant, squash, berries, fresh fruits, and dried fruits.   Cereals appear to be the richest source of fiber. Cereal fiber is found in whole grains and bran. Bran is the fiber-rich outer coat of cereal grain, which is largely removed in refining. In whole-grain cereals, the bran remains. In breakfast cereals, the largest amount of fiber is found in those with "bran" in their names. The fiber content is sometimes indicated on the label.   You may need to include additional fruits and vegetables each day.   In baking, for 1 cup white flour, you may use the following substitutions:   1 cup whole-wheat flour minus 2 tablespoons.   1/2 cup white flour plus 1/2 cup whole-wheat flour.   Hemorrhoids Hemorrhoids are dilated (enlarged) veins around the rectum. Sometimes clots will form in the veins. This  makes them swollen and painful. These are called thrombosed hemorrhoids. Causes of hemorrhoids include:  Constipation.   Straining to have a bowel movement.   HEAVY LIFTING  HOME CARE INSTRUCTIONS  Eat a well balanced diet and drink 6 to 8 glasses of water every day to avoid constipation. You may also use a bulk laxative.   Avoid straining to have bowel movements.   Keep anal area dry and clean.   Do not use a donut shaped pillow or sit on the toilet for long periods. This increases blood pooling and pain.   Move your bowels when your body has the urge; this will require less straining and will decrease pain and pressure.

## 2018-11-10 NOTE — Op Note (Signed)
[facility] Patient Name: Kristy Abbott Procedure Date: 11/10/2018 1:31 PM MRN: [id number] Date of Birth: 1966/01/06 Attending MD: Jonette Eva MD, MD CSN: [id number] Age: 53 Admit Type: Outpatient Procedure:                Colonoscopy, SCREENING Indications:              Screening for colorectal malignant neoplasm Providers:                Jonette Eva MD, MD, Judee Clara, RN, Nena Polio,                            RN Referring MD:             Carmelina Peal Sherryll Burger MD, MD Medicines:                Meperidine 75 mg IV, Midazolam 6 mg IV Complications:            No immediate complications. Estimated Blood Loss:     Estimated blood loss: none. Procedure:                Pre-Anesthesia Assessment:                           - Prior to the procedure, a History and Physical                            was performed, and patient medications and                            allergies were reviewed. The patient's tolerance of                            previous anesthesia was also reviewed. The risks                            and benefits of the procedure and the sedation                            options and risks were discussed with the patient.                            All questions were answered, and informed consent                            was obtained. Prior Anticoagulants: The patient has                            taken no previous anticoagulant or antiplatelet                            agents. ASA Grade Assessment: II - A patient with                            mild systemic disease. After reviewing the risks  and benefits, the patient was deemed in                            satisfactory condition to undergo the procedure.                            After obtaining informed consent, the colonoscope                            was passed under direct vision. Throughout the                            procedure, the patient's blood pressure, pulse, and                           oxygen saturations were monitored continuously. The                            [id number]-[id number] ([id number]) scope was introduced through                            the anus and advanced to the the cecum, identified                            by appendiceal orifice and ileocecal valve. The                            colonoscopy was somewhat difficult due to a                            tortuous colon. Successful completion of the                            procedure was aided by straightening and shortening                            the scope to obtain bowel loop reduction and                            COLOWRAP. The patient tolerated the procedure well.                            The quality of the bowel preparation was excellent.                            The ileocecal valve, appendiceal orifice, and                            rectum were photographed. Scope In: 2:04:29 PM Scope Out: 2:16:32 PM Scope Withdrawal Time: 0 hours 9 minutes 6 seconds  Total Procedure Duration: 0 hours 12 minutes 3 seconds  Findings:      The recto-sigmoid colon and sigmoid colon were mildly redundant.      The exam was otherwise without abnormality.  External and internal hemorrhoids were found. Impression:               - Redundant colon.                           - The examination was otherwise normal.                           - External and internal hemorrhoids. Moderate Sedation:      Moderate (conscious) sedation was administered by the endoscopy nurse       and supervised by the endoscopist. The following parameters were       monitored: oxygen saturation, heart rate, blood pressure, and response       to care. Total physician intraservice time was 28 minutes. Recommendation:           - Patient has a contact number available for                            emergencies. The signs and symptoms of potential                            delayed complications were discussed with the                             patient. Return to normal activities tomorrow.                            Written discharge instructions were provided to the                            patient.                           - High fiber diet.                           - Continue present medications.                           - Repeat colonoscopy in 10 years for surveillance. Procedure Code(s):        --- Professional ---                           [phone], Colonoscopy, flexible; diagnostic, including                            collection of specimen(s) by brushing or washing,                            when performed (separate procedure)                           99153, Moderate sedation; each additional 15                            minutes intraservice time  G0500, Moderate sedation services provided by the                            same physician or other qualified health care                            professional performing a gastrointestinal                            endoscopic service that sedation supports,                            requiring the presence of an independent trained                            observer to assist in the monitoring of the                            patient's level of consciousness and physiological                            status; initial 15 minutes of intra-service time;                            patient age 40 years or older (additional time may                            be reported with 83094, as appropriate) Diagnosis Code(s):        --- Professional ---                           Z12.11, Encounter for screening for malignant                            neoplasm of colon                           K64.8, Other hemorrhoids                           Q43.8, Other specified congenital malformations of                            intestine CPT copyright 2018 American Medical Association. All rights reserved. The codes documented in this  report are preliminary and upon coder review may  be revised to meet current compliance requirements. Jonette Eva, MD Jonette Eva MD, MD 11/10/2018 2:32:58 PM This report has been signed electronically. Number of Addenda: 0

## 2018-11-12 ENCOUNTER — Encounter (HOSPITAL_COMMUNITY): Payer: Self-pay | Admitting: Gastroenterology

## 2018-12-27 DIAGNOSIS — E039 Hypothyroidism, unspecified: Secondary | ICD-10-CM | POA: Diagnosis not present

## 2018-12-27 DIAGNOSIS — R05 Cough: Secondary | ICD-10-CM | POA: Diagnosis not present

## 2018-12-27 DIAGNOSIS — J069 Acute upper respiratory infection, unspecified: Secondary | ICD-10-CM | POA: Diagnosis not present

## 2018-12-27 DIAGNOSIS — Z299 Encounter for prophylactic measures, unspecified: Secondary | ICD-10-CM | POA: Diagnosis not present

## 2019-01-18 DIAGNOSIS — E663 Overweight: Secondary | ICD-10-CM | POA: Diagnosis not present

## 2019-02-14 DIAGNOSIS — Z299 Encounter for prophylactic measures, unspecified: Secondary | ICD-10-CM | POA: Diagnosis not present

## 2019-02-14 DIAGNOSIS — E039 Hypothyroidism, unspecified: Secondary | ICD-10-CM | POA: Diagnosis not present

## 2019-02-14 DIAGNOSIS — K5900 Constipation, unspecified: Secondary | ICD-10-CM | POA: Diagnosis not present

## 2019-02-14 DIAGNOSIS — Z6827 Body mass index (BMI) 27.0-27.9, adult: Secondary | ICD-10-CM | POA: Diagnosis not present

## 2019-03-01 DIAGNOSIS — R109 Unspecified abdominal pain: Secondary | ICD-10-CM | POA: Diagnosis not present

## 2019-03-01 DIAGNOSIS — E039 Hypothyroidism, unspecified: Secondary | ICD-10-CM | POA: Diagnosis not present

## 2019-03-01 DIAGNOSIS — Z6825 Body mass index (BMI) 25.0-25.9, adult: Secondary | ICD-10-CM | POA: Diagnosis not present

## 2019-03-01 DIAGNOSIS — Z299 Encounter for prophylactic measures, unspecified: Secondary | ICD-10-CM | POA: Diagnosis not present

## 2019-03-01 DIAGNOSIS — R634 Abnormal weight loss: Secondary | ICD-10-CM | POA: Diagnosis not present

## 2019-03-10 DIAGNOSIS — R109 Unspecified abdominal pain: Secondary | ICD-10-CM | POA: Diagnosis not present

## 2019-03-10 DIAGNOSIS — K8020 Calculus of gallbladder without cholecystitis without obstruction: Secondary | ICD-10-CM | POA: Diagnosis not present

## 2019-03-10 DIAGNOSIS — R634 Abnormal weight loss: Secondary | ICD-10-CM | POA: Diagnosis not present

## 2019-04-07 HISTORY — PX: TOTAL ABDOMINAL HYSTERECTOMY: SHX209

## 2019-04-14 ENCOUNTER — Other Ambulatory Visit (HOSPITAL_COMMUNITY): Payer: Self-pay | Admitting: Emergency Medicine

## 2019-04-14 ENCOUNTER — Ambulatory Visit (HOSPITAL_COMMUNITY)
Admission: RE | Admit: 2019-04-14 | Discharge: 2019-04-14 | Disposition: A | Discharge status: Home / Self Care | Payer: 59 | Source: Ambulatory Visit | Attending: Emergency Medicine | Admitting: Emergency Medicine

## 2019-04-14 ENCOUNTER — Other Ambulatory Visit: Payer: Self-pay | Admitting: Emergency Medicine

## 2019-04-14 ENCOUNTER — Other Ambulatory Visit: Payer: Self-pay

## 2019-04-14 DIAGNOSIS — R079 Chest pain, unspecified: Secondary | ICD-10-CM | POA: Insufficient documentation

## 2019-04-14 MED ORDER — IOHEXOL 350 MG/ML SOLN
100.0000 mL | Freq: Once | INTRAVENOUS | Status: AC | PRN
  Administered 2019-04-14: 21:00:00 100 mL via INTRAVENOUS

## 2019-04-27 ENCOUNTER — Encounter: Payer: Self-pay | Admitting: *Deleted

## 2019-04-28 ENCOUNTER — Encounter: Payer: Self-pay | Admitting: *Deleted

## 2019-04-28 ENCOUNTER — Ambulatory Visit: Payer: 59 | Admitting: Cardiovascular Disease

## 2019-04-28 ENCOUNTER — Encounter: Payer: Self-pay | Admitting: Cardiovascular Disease

## 2019-04-28 VITALS — BP 137/90 | HR 64 | Ht 64.0 in | Wt 136.2 lb

## 2019-04-28 DIAGNOSIS — R030 Elevated blood-pressure reading, without diagnosis of hypertension: Secondary | ICD-10-CM

## 2019-04-28 DIAGNOSIS — R079 Chest pain, unspecified: Secondary | ICD-10-CM

## 2019-04-28 DIAGNOSIS — K219 Gastro-esophageal reflux disease without esophagitis: Secondary | ICD-10-CM

## 2019-04-28 NOTE — Progress Notes (Signed)
CARDIOLOGY CONSULT NOTE  Patient ID: Kristy MaidensCarla D Abbott MRN: [id number] DOB/AGE: [birth year] 51 y.o.  Admit date: (Not on file) Primary Physician: Kirstie PeriShah, Ashish, MD Referring Physician: Kirstie PeriShah, Ashish, MD  Reason for Consultation: Chest pain  HPI: Kristy Abbott is a 53 y.o. female who is being seen today for the evaluation of chest pain at the request of Kirstie PeriShah, Ashish, MD.   I reviewed notes from her PCP.  She recently underwent a hysterectomy.  She went for a postoperative visit with her gynecologist 1 week later and told him that she had been having some chest discomfort and pressure.  [facility].  At that time she was found to have postoperative anemia with a hemoglobin as low as 6.7.  Troponins were normal.  Additional labs showed sodium 137, potassium 3.5, BUN 12, creatinine 0.42.  Chest x-ray showed no acute abnormalities.  I personally reviewed the ECG which demonstrated sinus rhythm with no ischemic abnormalities.  I reviewed the chest CT performed on 04/14/2019 which demonstrated small to moderate volume of postoperative hemorrhage within the patient's pelvis.  There was cholelithiasis.  There were pulmonary nodules.  There is no evidence of pulmonary embolism or aortic dissection.  I reviewed labs dated 04/26/2019: Hemoglobin 11.6, platelets 361.   She continues to experience retrosternal chest pains which she describes as a pressure-like sensation.  It radiates to the back.  It is occasionally associated with shortness of breath.  It can occur both with and without exertion.  She also complains of numbness in both hands and cold feet.  Chest pain can occur up to 3-4 times per day.  She has a history of anxiety and panic attacks but says the symptoms are entirely different.  She denies lightheadedness, dizziness, orthopnea, paroxysmal nocturnal dyspnea, syncope, and leg swelling.  Family history: Her mother underwent  three-vessel CABG at the age of 51.    No Known Allergies  Current Outpatient Medications  Medication Sig Dispense Refill  . Ferrous Sulfate (IRON SLOW RELEASE) 142 (45 Fe) MG TBCR Take 45 mg by mouth every other day.    . levothyroxine (SYNTHROID, LEVOTHROID) 88 MCG tablet Take 88 mcg by mouth daily before breakfast.    . naproxen sodium (ALEVE) 220 MG tablet Take 440 mg by mouth daily as needed (pain).    Marland Kitchen. omeprazole (PRILOSEC) 20 MG capsule Take 20 mg by mouth daily.   3   No current facility-administered medications for this visit.     Past Medical History:  Diagnosis Date  . Anemia   . Graves disease   . Hypothyroidism     Past Surgical History:  Procedure Laterality Date  . CESAREAN SECTION    . COLONOSCOPY N/A 11/10/2018   Procedure: COLONOSCOPY;  Surgeon: West BaliFields, Sandi L, MD;  Location: [facility];  Service: Endoscopy;  Laterality: N/A;  1:30  . Cyst removed from neck    . TOTAL ABDOMINAL HYSTERECTOMY  04/07/2019   Dr. Ernestina PennaNigel Buist    Social History   Socioeconomic History  . Marital status: Married    Spouse name: Not on file  . Number of children: Not on file  . Years of education: Not on file  . Highest education level: Not on file  Occupational History  . Not on file  Social Needs  . Financial resource strain: Not on file  . Food insecurity    Worry: Not on file  Inability: Not on file  . Transportation needs    Medical: Not on file    Non-medical: Not on file  Tobacco Use  . Smoking status: Former Smoker    Packs/day: 1.00    Types: Cigarettes    Start date: 06/07/1981    Quit date: 10/20/1998    Years since quitting: 20.5  . Smokeless tobacco: Never Used  Substance and Sexual Activity  . Alcohol use: No  . Drug use: No  . Sexual activity: Yes  Lifestyle  . Physical activity    Days per week: Not on file    Minutes per session: Not on file  . Stress: Not on file  Relationships  . Social Musicianconnections    Talks on phone: Not on file     Gets together: Not on file    Attends religious service: Not on file    Active member of club or organization: Not on file    Attends meetings of clubs or organizations: Not on file    Relationship status: Not on file  . Intimate partner violence    Fear of current or ex partner: Not on file    Emotionally abused: Not on file    Physically abused: Not on file    Forced sexual activity: Not on file  Other Topics Concern  . 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      Current Meds  Medication Sig  . Ferrous Sulfate (IRON SLOW RELEASE) 142 (45 Fe) MG TBCR Take 45 mg by mouth every other day.  . levothyroxine (SYNTHROID, LEVOTHROID) 88 MCG tablet Take 88 mcg by mouth daily before breakfast.  . naproxen sodium (ALEVE) 220 MG tablet Take 440 mg by mouth daily as needed (pain).  Marland Kitchen. omeprazole (PRILOSEC) 20 MG capsule Take 20 mg by mouth daily.       Review of systems complete and found to be negative unless listed above in HPI    Physical exam Blood pressure 132/90, pulse 64, height 5\' 4"  (1.626 m), weight 136 lb 3.2 oz (61.8 kg), last menstrual period 04/14/2019, SpO2 100 %. General: NAD Neck: No JVD, no thyromegaly or thyroid nodule.  Lungs: Clear to auscultation bilaterally with normal respiratory effort. CV: Nondisplaced PMI. Regular rate and rhythm, normal S1/S2, no S3/S4, no murmur.  No peripheral edema.  No carotid bruit.    Abdomen: Soft, nontender, no distention.  Skin: Intact without lesions or rashes.  Neurologic: Alert and oriented x 3.  Psych: Normal affect. Extremities: No clubbing or cyanosis.  HEENT: Normal.   ECG: Most recent ECG reviewed.   Labs: Lab Results  Component Value Date/Time   K 4.4 01/19/2014 07:21 PM   BUN 9 01/19/2014 07:21 PM   CREATININE 0.56 01/19/2014 07:21 PM   ALT 10 01/19/2014 07:21 PM   HGB 10.3 (L) 01/19/2014 07:21 PM     Lipids: No results  found for: LDLCALC, LDLDIRECT, CHOL, TRIG, HDL      ASSESSMENT AND PLAN:   1.  Chest pain: Symptoms represent mixed features, some of which are typical and some of which are atypical. I will proceed with a nuclear myocardial perfusion imaging study to evaluate for ischemic heart disease (Lexiscan Myoview).  2.  Elevated diastolic blood pressure: This will need further monitoring.  She does not carry a diagnosis of hypertension.  3.  GERD: On omeprazole 20 mg daily.    Disposition:  Follow up in 3 months  Signed: Kate Sable, M.D., F.A.C.C.  04/28/2019, 1:57 PM

## 2019-04-28 NOTE — Patient Instructions (Signed)
Medication Instructions:  Continue all current medications.  Labwork: none  Testing/Procedures:  Your physician has requested that you have a lexiscan myoview. For further information please visit [email or web address]. Please follow instruction sheet, as given.  Office will contact with results via phone or letter.    Follow-Up: 2 months   Any Other Special Instructions Will Be Listed Below (If Applicable).  If you need a refill on your cardiac medications before your next appointment, please call your pharmacy.  

## 2019-05-05 ENCOUNTER — Encounter (HOSPITAL_COMMUNITY): Payer: 59

## 2019-05-09 ENCOUNTER — Telehealth: Payer: Self-pay | Admitting: Cardiovascular Disease

## 2019-05-09 NOTE — Telephone Encounter (Signed)
Pt's ins denied paying for pt's lexiscan, called to inform pt. Left message

## 2019-05-10 ENCOUNTER — Encounter (HOSPITAL_COMMUNITY): Payer: 59

## 2019-05-10 ENCOUNTER — Ambulatory Visit (HOSPITAL_COMMUNITY): Payer: 59

## 2019-05-11 NOTE — Telephone Encounter (Signed)
I she able to run on a treadmill?   Kristy Abts MD

## 2019-05-11 NOTE — Telephone Encounter (Signed)
Called pt. No answer, no voicemail

## 2019-05-12 NOTE — Telephone Encounter (Signed)
Patient called [facility] wanting to discuss with Dr. Bronson Ing other options of a stress test due to her insurance denied the stress test.    [phone])

## 2019-05-12 NOTE — Telephone Encounter (Signed)
Returned pt call. Pt szates she is not able to walk/run on treadmill.

## 2019-05-13 NOTE — Telephone Encounter (Signed)
I will have to defer this to Dr Raliegh Ip who will be back Monday. I am not sure if he would challenge the denial or consider an alternative test.   Zandra Abts MD

## 2019-05-16 NOTE — Telephone Encounter (Signed)
Try a GXT.

## 2019-05-16 NOTE — Telephone Encounter (Signed)
Or rather than a GXT, a stress echo. If a stress echo is not approved, obtain a GXT.

## 2019-05-18 NOTE — Telephone Encounter (Signed)
My understanding is patient stated she cannot walk on a treadmill

## 2019-05-18 NOTE — Telephone Encounter (Signed)
Then the only other option would be coronary CT angiography.

## 2019-07-05 ENCOUNTER — Ambulatory Visit (INDEPENDENT_AMBULATORY_CARE_PROVIDER_SITE_OTHER): Payer: 59 | Admitting: Student

## 2019-07-05 ENCOUNTER — Encounter: Payer: Self-pay | Admitting: Student

## 2019-07-05 ENCOUNTER — Other Ambulatory Visit: Payer: Self-pay

## 2019-07-05 VITALS — BP 130/82 | HR 56 | Temp 97.1°F | Ht 64.0 in | Wt 136.0 lb

## 2019-07-05 DIAGNOSIS — E039 Hypothyroidism, unspecified: Secondary | ICD-10-CM | POA: Diagnosis not present

## 2019-07-05 DIAGNOSIS — R072 Precordial pain: Secondary | ICD-10-CM

## 2019-07-05 NOTE — Patient Instructions (Signed)
Medication Instructions:  Continue current medication regimen  Labwork: None  Testing/Procedures: Stress Echocardiogram  Your physician has requested that you have a stress echocardiogram. For further information please visit [email or web address]. Please follow instruction sheet as given.   Follow-Up: In 3 months unless stress echocardiogram abnormal.   Any Other Special Instructions Will Be Listed Below (If Applicable).     If you need a refill on your cardiac medications before your next appointment, please call your pharmacy.

## 2019-07-05 NOTE — Progress Notes (Signed)
Cardiology Office Note    Date:  07/05/2019   ID:  Kristy Abbott, DOB 12/01/1965, MRN [id number]  PCP:  Monico Blitz, MD  Cardiologist: Kate Sable, MD    Chief Complaint  Patient presents with  . Follow-up    2 month visit    History of Present Illness:    Kristy Abbott is a 53 y.o. female with past medical history of hypothyroidism and family history of CAD who presents to the office today for 90-month follow-up.  She was last examined by Dr. Bronson Ing in 04/2019 as a new patient referral for chest pain.  She had recently underwent a hysterectomy and reported chest discomfort at the time of her follow-up visit with her gynecologist. Was evaluated at [facility] and was anemic with Hgb at 6.7 and troponin values were negative. Received 2 units pRBC's with improvement in her symptoms and Chest CT showed a small to moderate volume of postoperative hemorrhage. She was still having episodes of retrosternal chest discomfort at the time of her visit which could occur 3-4 times per day and at rest or with activity. Given her mixed symptoms, a Lexiscan Myoview was recommended for initial assessment.  In talking with the patient today, she reports still having intermittent episodes of chest discomfort and she describes a shooting pain which starts in her left pectoral region and radiates into her back. Denies any associated nausea, vomiting, or diaphoresis when this occurs. Symptoms can occur at rest or with activity. Occurred yesterday while watching TV. Episodes last for 10 to 15 minutes then spontaneously resolve. She works at [employer] and has been able to perform her routine duties and carry boxes without symptoms but does note occasional episodes when walking. Denies any recent orthopnea, PND, or lower extremity edema. She does experience occasional paresthesias along her lower extremities bilaterally and reports her feet feel cold at night.  She has a known family history of  CAD with her mother requiring a coronary stent in her 44's and CABG in her 20's. The patient is a former smoker but quit 10+ years ago. Denies any known history of HTN, HLD, or Type 2 DM.    Past Medical History:  Diagnosis Date  . Anemia   . Graves disease   . Hypothyroidism     Past Surgical History:  Procedure Laterality Date  . CESAREAN SECTION    . COLONOSCOPY N/A 11/10/2018   Procedure: COLONOSCOPY;  Surgeon: Kristy Binder, MD;  [facility];  Service: Endoscopy;  Laterality: N/A;  1:30  . Cyst removed from neck    . TOTAL ABDOMINAL HYSTERECTOMY  04/07/2019   Dr. Gari Abbott    Current Medications: Outpatient Medications Prior to Visit  Medication Sig Dispense Refill  . Ferrous Sulfate (IRON SLOW RELEASE) 142 (45 Fe) MG TBCR Take 45 mg by mouth every other day.    . levothyroxine (SYNTHROID, LEVOTHROID) 88 MCG tablet Take 88 mcg by mouth daily before breakfast.    . naproxen sodium (ALEVE) 220 MG tablet Take 440 mg by mouth daily as needed (pain).    Marland Kitchen omeprazole (PRILOSEC) 40 MG capsule Take 40 mg by mouth daily.    Marland Kitchen omeprazole (PRILOSEC) 20 MG capsule Take 20 mg by mouth daily.   3   No facility-administered medications prior to visit.      Allergies:   Patient has no known allergies.   Social History   Socioeconomic History  . Marital status: Married    Spouse  name: Not on file  . Number of children: Not on file  . Years of education: Not on file  . Highest education level: Not on file  Occupational History  . Not on file  Social Needs  . Financial resource strain: Not on file  . Food insecurity    Worry: Not on file    Inability: Not on file  . Transportation needs    Medical: Not on file    Non-medical: Not on file  Tobacco Use  . Smoking status: Former Smoker    Packs/day: 1.00    Types: Cigarettes    Start date: 06/07/1981    Quit date: 10/20/1998    Years since quitting: 20.7  . Smokeless tobacco: Never Used  Substance and Sexual  Activity  . Alcohol use: No  . Drug use: No  . Sexual activity: Yes  Lifestyle  . Physical activity    Days per week: Not on file    Minutes per session: Not on file  . Stress: Not on file  Relationships  . Social Musician on phone: Not on file    Gets together: Not on file    Attends religious service: Not on file    Active member of club or organization: Not on file    Attends meetings of clubs or organizations: Not on file    Relationship status: Not on file  Other Topics Concern  . 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     Family History:  The patient's family history includes CAD in her mother; Diabetes in her mother.   Review of Systems:   Please see the history of present illness.     General:  No chills, fever, night sweats or weight changes.  Cardiovascular:  No dyspnea on exertion, edema, orthopnea, palpitations, paroxysmal nocturnal dyspnea. Positive for chest pain.  Dermatological: No rash, lesions/masses Respiratory: No cough, dyspnea Urologic: No hematuria, dysuria Abdominal:   No nausea, vomiting, diarrhea, bright red blood per rectum, melena, or hematemesis Neurologic:  No visual changes, wkns, changes in mental status. All other systems reviewed and are otherwise negative except as noted above.   Physical Exam:    VS:  BP 130/82   Pulse (!) 56   Temp (!) 97.1 F (36.2 C)   Ht 5\' 4"  (1.626 m)   Wt 136 lb (61.7 kg)   LMP 04/14/2019   SpO2 99%   BMI 23.34 kg/m    General: Well developed, well nourished,female appearing in no acute distress. Head: Normocephalic, atraumatic, sclera non-icteric, no xanthomas, nares are without discharge.  Neck: No carotid bruits. JVD not elevated.  Lungs: Respirations regular and unlabored, without wheezes or rales.  Heart: Regular rate and rhythm. No S3 or S4.  No murmur, no rubs, or gallops appreciated. Abdomen:  Soft, non-tender, non-distended with normoactive bowel sounds. No hepatomegaly. No rebound/guarding. No obvious abdominal masses. Msk:  Strength and tone appear normal for age. No joint deformities or effusions. Extremities: No clubbing or cyanosis. No lower extremity edema.  Distal pedal pulses are 2+ bilaterally. Neuro: Alert and oriented X 3. Moves all extremities spontaneously. No focal deficits noted. Psych:  Responds to questions appropriately with a normal affect. Skin: No rashes or lesions noted  Wt Readings from Last 3 Encounters:  07/05/19 136 lb (61.7 kg)  04/28/19 136 lb 3.2 oz (61.8 kg)  11/10/18 155 lb (70.3  kg)     Studies/Labs Reviewed:   EKG:  EKG is not ordered today.    Recent Labs: No results found for requested labs within last 8760 hours.   Lipid Panel No results found for: CHOL, TRIG, HDL, CHOLHDL, VLDL, LDLCALC, LDLDIRECT  Additional studies/ records that were reviewed today include:   CTA: 03/2019 IMPRESSION: 1. Status post hysterectomy. There is a small to moderate volume of postoperative hemorrhage within the patient's pelvis and along the bilateral pericolic gutters. There is an 8 x 2.7 cm probable hematoma within the patient's high midline pelvis. There is no CT evidence for active extravasation on this exam. 2. Cholelithiasis without CT evidence of acute cholecystitis. 3. Pulmonary nodules as detailed above measuring up to approximately 6 mm. Follow-up is recommended. Non-contrast chest CT at 3-6 months is recommended. If the nodules are stable at time of repeat CT, then future CT at 18-24 months (from today's scan) is considered optional for low-risk patients, but is recommended for high-risk patients. This recommendation follows the consensus statement: Guidelines for Management of Incidental Pulmonary Nodules Detected on CT Images: From the Fleischner Society 2017; Radiology 2017; 284:228-243. 4. No pulmonary embolus. 5. No aortic dissection.   Assessment:    1. Precordial chest pain   2. Hypothyroidism, unspecified type      Plan:   In order of problems listed above:  1. Precordial Chest Pain - She describes episodes of chest discomfort which can occur at rest or with activity and lasts for 10 to 15 minutes and spontaneously resolves. Has occurred a few times this week while watching television. She has been able to perform exertional activities at work without symptoms. - Cardiac risk factors include family history of CAD and prior tobacco use. No known history of HTN, HLD, or Type 2 DM. Recent EKG showed no acute findings.  - It was previously recommended she have a Lexiscan Myoview but this was denied by her insurance. Reviewed options with the patient and she does believe she can walk on a treadmill.  Will attempt to obtain a stress echocardiogram.  If she is unable to achieve target heart rate (baseline HR in the 50's during today's visit), would plan to pursue a Coronary CT which was reviewed with the patient today.  2. Hypothyroidism - Followed by PCP. TSH 3.690 in 04/2019. Continue Synthroid at current dosing.    Medication Adjustments/Labs and Tests Ordered: Current medicines are reviewed at length with the patient today.  Concerns regarding medicines are outlined above.  Medication changes, Labs and Tests ordered today are listed in the Patient Instructions below. Patient Instructions  Medication Instructions:  Continue current medication regimen  Labwork: None  Testing/Procedures: Stress Echocardiogram  Your physician has requested that you have a stress echocardiogram. For further information please visit [email or web address]. Please follow instruction sheet as given.  Follow-Up: In 3 months unless stress echocardiogram abnormal.   Any Other Special Instructions Will Be Listed Below (If Applicable).  If you need a refill on your cardiac medications before your next appointment, please call your  pharmacy.    Signed, Ellsworth LennoxBrittany M , PA-C  07/05/2019 5:01 PM    [facility] [address] [place], [place] [address] Phone: [phone] Fax: [phone]

## 2019-07-15 ENCOUNTER — Other Ambulatory Visit (HOSPITAL_COMMUNITY)
Admission: RE | Admit: 2019-07-15 | Discharge: 2019-07-15 | Disposition: A | Discharge status: Home / Self Care | Payer: 59 | Source: Ambulatory Visit | Attending: Student | Admitting: Student

## 2019-07-15 DIAGNOSIS — Z20828 Contact with and (suspected) exposure to other viral communicable diseases: Secondary | ICD-10-CM | POA: Insufficient documentation

## 2019-07-15 DIAGNOSIS — Z01812 Encounter for preprocedural laboratory examination: Secondary | ICD-10-CM | POA: Diagnosis present

## 2019-07-15 LAB — SARS CORONAVIRUS 2 (TAT 6-24 HRS): SARS Coronavirus 2: NEGATIVE

## 2019-07-19 ENCOUNTER — Telehealth: Payer: Self-pay | Admitting: *Deleted

## 2019-07-19 ENCOUNTER — Inpatient Hospital Stay (HOSPITAL_COMMUNITY): Admission: RE | Admit: 2019-07-19 | Payer: 59 | Source: Ambulatory Visit

## 2019-07-19 NOTE — Telephone Encounter (Signed)
-----   Message from Erma Heritage, [place] sent at 07/19/2019  7:47 AM EDT ----- Please let the patient know her COVID test was negative.  She was scheduled to undergo a stress echocardiogram but this was postponed given insurance authorization.  Will follow-up with the patient once her insurance approves the study.

## 2019-07-19 NOTE — Telephone Encounter (Signed)
Called patient with test results. No answer. Left message to call back.  

## 2019-07-26 ENCOUNTER — Telehealth: Payer: Self-pay | Admitting: Cardiovascular Disease

## 2019-07-26 NOTE — Telephone Encounter (Signed)
Spoke with pt. COVID test scheduled patient notified

## 2019-07-26 NOTE — Telephone Encounter (Signed)
Pt received auth for her Stress Echo, she needs to have her COVID test

## 2019-08-03 ENCOUNTER — Encounter (HOSPITAL_COMMUNITY): Payer: 59

## 2019-08-05 ENCOUNTER — Other Ambulatory Visit: Payer: Self-pay

## 2019-08-05 ENCOUNTER — Other Ambulatory Visit (HOSPITAL_COMMUNITY)
Admission: RE | Admit: 2019-08-05 | Discharge: 2019-08-05 | Disposition: A | Discharge status: Home / Self Care | Payer: 59 | Source: Ambulatory Visit | Attending: Student | Admitting: Student

## 2019-08-05 DIAGNOSIS — Z20828 Contact with and (suspected) exposure to other viral communicable diseases: Secondary | ICD-10-CM | POA: Insufficient documentation

## 2019-08-05 DIAGNOSIS — Z01812 Encounter for preprocedural laboratory examination: Secondary | ICD-10-CM | POA: Insufficient documentation

## 2019-08-05 LAB — SARS CORONAVIRUS 2 (TAT 6-24 HRS): SARS Coronavirus 2: NEGATIVE

## 2019-08-08 ENCOUNTER — Other Ambulatory Visit: Payer: Self-pay

## 2019-08-08 ENCOUNTER — Ambulatory Visit (HOSPITAL_COMMUNITY)
Admission: RE | Admit: 2019-08-08 | Discharge: 2019-08-08 | Disposition: A | Discharge status: Home / Self Care | Payer: 59 | Source: Ambulatory Visit | Attending: Student | Admitting: Student

## 2019-08-08 DIAGNOSIS — R072 Precordial pain: Secondary | ICD-10-CM | POA: Diagnosis not present

## 2019-08-08 NOTE — Progress Notes (Signed)
*  PRELIMINARY RESULTS* Echocardiogram Echocardiogram Stress Test has been performed.  Kristy Abbott 08/08/2019, 10:40 AM

## 2019-10-06 ENCOUNTER — Telehealth: Payer: Self-pay | Admitting: Cardiovascular Disease

## 2019-10-06 NOTE — Telephone Encounter (Signed)

## 2019-10-07 ENCOUNTER — Encounter: Payer: Self-pay | Admitting: Cardiovascular Disease

## 2019-10-07 ENCOUNTER — Telehealth (INDEPENDENT_AMBULATORY_CARE_PROVIDER_SITE_OTHER): Payer: 59 | Admitting: Cardiovascular Disease

## 2019-10-07 VITALS — BP 126/81 | HR 51 | Ht 62.0 in | Wt 132.0 lb

## 2019-10-07 DIAGNOSIS — R072 Precordial pain: Secondary | ICD-10-CM | POA: Diagnosis not present

## 2019-10-07 DIAGNOSIS — R001 Bradycardia, unspecified: Secondary | ICD-10-CM

## 2019-10-07 NOTE — Patient Instructions (Signed)
Medication Instructions:  Continue all current medications.  Labwork: none  Testing/Procedures: none  Follow-Up: As needed.    Any Other Special Instructions Will Be Listed Below (If Applicable).  If you need a refill on your cardiac medications before your next appointment, please call your pharmacy.  

## 2019-10-07 NOTE — Progress Notes (Signed)
Virtual Visit via Telephone Note   This visit type was conducted due to national recommendations for restrictions regarding the COVID-19 Pandemic (e.g. social distancing) in an effort to limit this patient's exposure and mitigate transmission in our community.  Due to her co-morbid illnesses, this patient is at least at moderate risk for complications without adequate follow up.  This format is felt to be most appropriate for this patient at this time.  The patient did not have access to video technology/had technical difficulties with video requiring transitioning to audio format only (telephone).  All issues noted in this document were discussed and addressed.  No physical exam could be performed with this format.  Please refer to the patient's chart for her  consent to telehealth for [facility].   Date:  10/07/2019   ID:  Kristy Abbott, DOB Nov 01, 1965, MRN [id number]  Patient Location: Home Provider Location: Office  PCP:  [name], MD  Cardiologist:  Prentice Docker, MD  Electrophysiologist:  None   Evaluation Performed:  Follow-Up Visit  Chief Complaint: Chest pain  History of Present Illness:    Kristy Abbott is a 53 y.o. female with chest pain.  She underwent a normal stress echocardiogram on 08/08/2019.  Baseline LV systolic function showed EF 55 to 60%.  She seldom has episodes of chest pain.  She denies leg swelling, orthopnea, palpitations, paroxysmal nocturnal dyspnea.  Hands and feet stay cold.  She denies claudication pain.  Past Medical History:  Diagnosis Date  . Anemia   . Graves disease   . Hypothyroidism    Past Surgical History:  Procedure Laterality Date  . CESAREAN SECTION    . COLONOSCOPY N/A 11/10/2018   Procedure: COLONOSCOPY;  Surgeon: West Bali, MD;  [facility];  Service: Endoscopy;  Laterality: N/A;  1:30  . Cyst removed from neck    . TOTAL ABDOMINAL HYSTERECTOMY  04/07/2019   Dr. Ernestina Penna     Current  Meds  Medication Sig  . Ferrous Sulfate (IRON SLOW RELEASE) 142 (45 Fe) MG TBCR Take 45 mg by mouth every other day.  . levothyroxine (SYNTHROID, LEVOTHROID) 88 MCG tablet Take 88 mcg by mouth daily before breakfast.  . naproxen sodium (ALEVE) 220 MG tablet Take 440 mg by mouth daily as needed (pain).  Marland Kitchen omeprazole (PRILOSEC) 40 MG capsule Take 40 mg by mouth daily.     Allergies:   Patient has no known allergies.   Social History   Tobacco Use  . Smoking status: Former Smoker    Packs/day: 1.00    Types: Cigarettes    Start date: 06/07/1981    Quit date: 10/20/1998    Years since quitting: 20.9  . Smokeless tobacco: Never Used  Substance Use Topics  . Alcohol use: No  . Drug use: No     Family Hx: The patient's family history includes CAD in her mother; Diabetes in her mother. There is no history of Colon cancer or Colon polyps.  ROS:   Please see the history of present illness.     All other systems reviewed and are negative.   Prior CV studies:   The following studies were reviewed today:  Stress echocardiogram 08/08/2019:   1. Left ventricular ejection fraction, by visual estimation, is 55 to 60%. The left ventricle has normal function. There is no left ventricular hypertrophy.  2. Negative stress echo for ischemia.  3. Post-stress: left ventricular systolic function was <65%.  4. Pre-stress: left ventricular  systolic function was 75-91%.  5. The Stress test was discontinued at stage 3 due to target heart rate achieved.  Labs/Other Tests and Data Reviewed:    EKG:  No ECG reviewed.  Recent Labs: No results found for requested labs within last 8760 hours.   Recent Lipid Panel No results found for: CHOL, TRIG, HDL, CHOLHDL, LDLCALC, LDLDIRECT  Wt Readings from Last 3 Encounters:  10/07/19 132 lb (59.9 kg)  07/05/19 136 lb (61.7 kg)  04/28/19 136 lb 3.2 oz (61.8 kg)     Objective:    Vital Signs:  BP 126/81   Pulse (!) 51   Ht 5\' 2"  (1.575 m)   Wt 132  lb (59.9 kg)   LMP 04/14/2019   BMI 24.14 kg/m    VITAL SIGNS:  reviewed  ASSESSMENT & PLAN:    1.  Chest pain: She underwent a normal stress echocardiogram on 08/08/2019.  No further cardiac testing is indicated at this time.  2.  Bradycardia: She has resting baseline bradycardia and is asymptomatic from the standpoint.    COVID-19 Education: The signs and symptoms of COVID-19 were discussed with the patient and how to seek care for testing (follow up with PCP or arrange E-visit).  The importance of social distancing was discussed today.  Time:   Today, I have spent 5 minutes with the patient with telehealth technology discussing the above problems.     Medication Adjustments/Labs and Tests Ordered: Current medicines are reviewed at length with the patient today.  Concerns regarding medicines are outlined above.   Tests Ordered: No orders of the defined types were placed in this encounter.   Medication Changes: No orders of the defined types were placed in this encounter.   Follow Up:  Virtual Visit  prn  Signed, Kate Sable, MD  10/07/2019 10:04 AM    [place]

## 2019-11-05 ENCOUNTER — Other Ambulatory Visit: Payer: Self-pay

## 2019-11-05 ENCOUNTER — Ambulatory Visit
Admission: EM | Admit: 2019-11-05 | Discharge: 2019-11-05 | Disposition: A | Discharge status: Home / Self Care | Payer: 59 | Attending: Emergency Medicine | Admitting: Emergency Medicine

## 2019-11-05 DIAGNOSIS — L239 Allergic contact dermatitis, unspecified cause: Secondary | ICD-10-CM

## 2019-11-05 MED ORDER — DEXAMETHASONE SODIUM PHOSPHATE 10 MG/ML IJ SOLN: 10.0000 mg | Freq: Once | INTRAMUSCULAR | Status: DC

## 2019-11-05 MED ORDER — CETIRIZINE HCL 10 MG PO TABS: 10.0000 mg | ORAL_TABLET | Freq: Every day | ORAL | 0 refills | Status: AC

## 2019-11-05 MED ORDER — PREDNISONE 10 MG PO TABS: 20.0000 mg | ORAL_TABLET | Freq: Every day | ORAL | 0 refills | Status: AC

## 2019-11-05 MED ORDER — DEXAMETHASONE SODIUM PHOSPHATE 10 MG/ML IJ SOLN
10.0000 mg | Freq: Once | INTRAMUSCULAR | Status: AC
  Administered 2019-11-05: 10 mg via INTRAMUSCULAR

## 2019-11-05 NOTE — Discharge Instructions (Addendum)
Prescribed short-term dose of prednisone Take as prescribed and to completion Limit hot shower and baths, or bathe with warm water.   Moisturize skin daily Follow up with PCP if symptoms persists Return or go to the ER if you have any new or worsening symptoms

## 2019-11-05 NOTE — ED Triage Notes (Signed)
Pt received covid vaccine on jan 5th and has large red area on left arm

## 2019-11-05 NOTE — ED Provider Notes (Signed)
[facility]    CSN: [id number] Arrival date & time: 11/05/19  1003      History   Chief Complaint Chief Complaint  Patient presents with   Rash    HPI Kristy Abbott is a 54 y.o. female.    Vonceil Lull.  54 years old female presented to the urgent care with a complain of rash that began on 11 days ago. Report the symptoms developed after receiving COVID-19 Moderna vaccine.  She denies changes in soaps, detergents, or anyone with similar symptoms.  She localizes the rash to her left upper arm.  She describes it as painful red spreading.  She has tried OTC med without  relief.  Nothing made her symptoms worse.  Marland Kitchen       Past Medical History:  Diagnosis Date   Anemia    Graves disease    Hypothyroidism     Patient Active Problem List   Diagnosis Date Noted   Special screening for malignant neoplasms, colon     Past Surgical History:  Procedure Laterality Date   CESAREAN SECTION     COLONOSCOPY N/A 11/10/2018   Procedure: COLONOSCOPY;  Surgeon: Danie Binder, MD;  [facility];  Service: Endoscopy;  Laterality: N/A;  1:30   Cyst removed from neck     TOTAL ABDOMINAL HYSTERECTOMY  04/07/2019   Dr. Gari Crown    OB History   No obstetric history on file.      Home Medications    Prior to Admission medications   Medication Sig Start Date End Date Taking? Authorizing Provider  Ferrous Sulfate (IRON SLOW RELEASE) 142 (45 Fe) MG TBCR Take 45 mg by mouth every other day.    [provider]  levothyroxine (SYNTHROID, LEVOTHROID) 88 MCG tablet Take 88 mcg by mouth daily before breakfast.    [provider]  naproxen sodium (ALEVE) 220 MG tablet Take 440 mg by mouth daily as needed (pain).    [provider]  omeprazole (PRILOSEC) 40 MG capsule Take 40 mg by mouth daily.    [provider]  predniSONE (DELTASONE) 10 MG tablet Take 2 tablets (20 mg total) by mouth daily for 5 days. 11/05/19 11/10/19   Emerson Monte, FNP    Family History Family History  Problem Relation Age of Onset   CAD Mother    Diabetes Mother    Colon cancer Neg Hx    Colon polyps Neg Hx     Social History Social History   Tobacco Use   Smoking status: Former Smoker    Packs/day: 1.00    Types: Cigarettes    Start date: 06/07/1981    Quit date: 10/20/1998    Years since quitting: 21.0   Smokeless tobacco: Never Used  Substance Use Topics   Alcohol use: No   Drug use: No     Allergies   Patient has no known allergies.   Review of Systems Review of Systems  Constitutional: Negative.   Respiratory: Negative.   Cardiovascular: Negative.   Skin: Positive for color change and rash.  All other systems reviewed and are negative.    Physical Exam Triage Vital Signs ED Triage Vitals  Enc Vitals Group     BP 11/05/19 1014 (!) 159/108     Pulse Rate 11/05/19 1014 71     Resp 11/05/19 1014 16     Temp 11/05/19 1014 98.5 F (36.9 C)     Temp Source 11/05/19 1014 Oral  SpO2 11/05/19 1014 99 %     Weight --      Height --      Head Circumference --      Peak Flow --      Pain Score 11/05/19 1019 6     Pain Loc --      Pain Edu? --      Excl. in GC? --    No data found.  Updated Vital Signs BP (!) 159/108 (BP Location: Right Arm)    Pulse 71    Temp 98.5 F (36.9 C) (Oral)    Resp 16    LMP 04/14/2019    SpO2 99%   Visual Acuity Right Eye Distance:   Left Eye Distance:   Bilateral Distance:    Right Eye Near:   Left Eye Near:    Bilateral Near:     Physical Exam Vitals and nursing note reviewed.  Constitutional:      General: She is not in acute distress.    Appearance: Normal appearance. She is normal weight. She is not ill-appearing or toxic-appearing.  Cardiovascular:     Rate and Rhythm: Normal rate and regular rhythm.     Pulses: Normal pulses.     Heart sounds: Normal heart sounds. No murmur.  Pulmonary:     Effort: Pulmonary effort is normal. No  respiratory distress.     Breath sounds: Normal breath sounds.  Skin:    General: Skin is warm.     Coloration: Skin is not jaundiced or pale.     Findings: Rash present. Rash is macular.  Neurological:     Mental Status: She is alert.      UC Treatments / Results  Labs (all labs ordered are listed, but only abnormal results are displayed) Labs Reviewed - No data to display  EKG   Radiology No results found.  Procedures Procedures (including critical care time)  Medications Ordered in UC Medications  dexamethasone (DECADRON) injection 10 mg (has no administration in time range)    Initial Impression / Assessment and Plan / UC Course  I have reviewed the triage vital signs and the nursing notes.  Pertinent labs & imaging results that were available during my care of the patient were reviewed by me and considered in my medical decision making (see chart for details).   Will treat patient for allergic dermatitis Decadron 10 mg injection was given in office Prednisone was prescribed Zyrtec was prescribed Advised patient to report symptoms to CDC To go to ED for worsening of symptoms Patient verbalized understanding of the plan of care  Final Clinical Impressions(s) / UC Diagnoses   Final diagnoses:  Allergic dermatitis     Discharge Instructions     Prescribed short-term dose of prednisone Take as prescribed and to completion Limit hot shower and baths, or bathe with warm water.   Moisturize skin daily Follow up with PCP if symptoms persists Return or go to the ER if you have any new or worsening symptoms     ED Prescriptions    Medication Sig Dispense Auth. Provider   predniSONE (DELTASONE) 10 MG tablet Take 2 tablets (20 mg total) by mouth daily for 5 days. 10 tablet Cyril Railey, Zachery Dakins, FNP     PDMP not reviewed this encounter.   Durward Parcel, FNP 11/05/19 1052

## 2021-01-23 ENCOUNTER — Encounter (HOSPITAL_COMMUNITY): Payer: Self-pay

## 2021-01-23 ENCOUNTER — Emergency Department (HOSPITAL_COMMUNITY): Payer: 59

## 2021-01-23 ENCOUNTER — Other Ambulatory Visit: Payer: Self-pay

## 2021-01-23 ENCOUNTER — Emergency Department (HOSPITAL_COMMUNITY)
Admission: EM | Admit: 2021-01-23 | Discharge: 2021-01-23 | Disposition: A | Discharge status: Home / Self Care | Payer: 59 | Attending: Emergency Medicine | Admitting: Emergency Medicine

## 2021-01-23 DIAGNOSIS — R42 Dizziness and giddiness: Secondary | ICD-10-CM

## 2021-01-23 DIAGNOSIS — R55 Syncope and collapse: Secondary | ICD-10-CM | POA: Insufficient documentation

## 2021-01-23 DIAGNOSIS — Z79899 Other long term (current) drug therapy: Secondary | ICD-10-CM | POA: Diagnosis not present

## 2021-01-23 DIAGNOSIS — Z20822 Contact with and (suspected) exposure to covid-19: Secondary | ICD-10-CM | POA: Insufficient documentation

## 2021-01-23 DIAGNOSIS — E039 Hypothyroidism, unspecified: Secondary | ICD-10-CM | POA: Insufficient documentation

## 2021-01-23 DIAGNOSIS — D649 Anemia, unspecified: Secondary | ICD-10-CM | POA: Insufficient documentation

## 2021-01-23 DIAGNOSIS — E0500 Thyrotoxicosis with diffuse goiter without thyrotoxic crisis or storm: Secondary | ICD-10-CM | POA: Diagnosis not present

## 2021-01-23 DIAGNOSIS — R519 Headache, unspecified: Secondary | ICD-10-CM | POA: Diagnosis not present

## 2021-01-23 LAB — RESP PANEL BY RT-PCR (FLU A&B, COVID) ARPGX2
Influenza A by PCR: NEGATIVE
Influenza B by PCR: NEGATIVE
SARS Coronavirus 2 by RT PCR: NEGATIVE

## 2021-01-23 LAB — LIPID PANEL
Cholesterol: 234 mg/dL — ABNORMAL HIGH (ref 0–200)
HDL: 60 mg/dL (ref >40)
LDL Cholesterol: 153 mg/dL — ABNORMAL HIGH (ref 0–99)
Total CHOL/HDL Ratio: 3.9 RATIO
Triglycerides: 105 mg/dL (ref <150)
VLDL: 21 mg/dL (ref 0–40)

## 2021-01-23 LAB — URINALYSIS, ROUTINE W REFLEX MICROSCOPIC
Bacteria, UA: NONE SEEN
Bilirubin Urine: NEGATIVE
Glucose, UA: NEGATIVE mg/dL
Ketones, ur: NEGATIVE mg/dL
Leukocytes,Ua: NEGATIVE
Nitrite: NEGATIVE
Protein, ur: NEGATIVE mg/dL
Specific Gravity, Urine: 1.017 (ref 1.005–1.030)
pH: 8 (ref 5.0–8.0)

## 2021-01-23 LAB — DIFFERENTIAL
Abs Immature Granulocytes: 0.01 10*3/uL (ref 0.00–0.07)
Basophils Absolute: 0 10*3/uL (ref 0.0–0.1)
Basophils Relative: 0 %
Eosinophils Absolute: 0 10*3/uL (ref 0.0–0.5)
Eosinophils Relative: 1 %
Immature Granulocytes: 0 %
Lymphocytes Relative: 43 %
Lymphs Abs: 2.2 10*3/uL (ref 0.7–4.0)
Monocytes Absolute: 0.2 10*3/uL (ref 0.1–1.0)
Monocytes Relative: 5 %
Neutro Abs: 2.5 10*3/uL (ref 1.7–7.7)
Neutrophils Relative %: 51 %

## 2021-01-23 LAB — PROTIME-INR
INR: 0.9 (ref 0.8–1.2)
Prothrombin Time: 12.1 seconds (ref 11.4–15.2)

## 2021-01-23 LAB — COMPREHENSIVE METABOLIC PANEL
ALT: 16 U/L (ref 0–44)
AST: 22 U/L (ref 15–41)
Albumin: 4.7 g/dL (ref 3.5–5.0)
Alkaline Phosphatase: 45 U/L (ref 38–126)
Anion gap: 12 (ref 5–15)
BUN: 12 mg/dL (ref 6–20)
CO2: 23 mmol/L (ref 22–32)
Calcium: 9.5 mg/dL (ref 8.9–10.3)
Chloride: 101 mmol/L (ref 98–111)
Creatinine, Ser: 0.63 mg/dL (ref 0.44–1.00)
GFR, Estimated: >60 mL/min (ref >60)
Glucose, Bld: 137 mg/dL — ABNORMAL HIGH (ref 70–99)
Potassium: 4.5 mmol/L (ref 3.5–5.1)
Sodium: 136 mmol/L (ref 135–145)
Total Bilirubin: 0.9 mg/dL (ref 0.3–1.2)
Total Protein: 8 g/dL (ref 6.5–8.1)

## 2021-01-23 LAB — RAPID URINE DRUG SCREEN, HOSP PERFORMED
Amphetamines: NOT DETECTED
Barbiturates: NOT DETECTED
Benzodiazepines: NOT DETECTED
Cocaine: NOT DETECTED
Opiates: NOT DETECTED
Tetrahydrocannabinol: NOT DETECTED

## 2021-01-23 LAB — CBC
HCT: 41.3 % (ref 36.0–46.0)
Hemoglobin: 13.8 g/dL (ref 12.0–15.0)
MCH: 31.7 pg (ref 26.0–34.0)
MCHC: 33.4 g/dL (ref 30.0–36.0)
MCV: 94.7 fL (ref 80.0–100.0)
Platelets: 297 10*3/uL (ref 150–400)
RBC: 4.36 MIL/uL (ref 3.87–5.11)
RDW: 12.4 % (ref 11.5–15.5)
WBC: 5 10*3/uL (ref 4.0–10.5)
nRBC: 0 % (ref 0.0–0.2)

## 2021-01-23 LAB — ETHANOL: Alcohol, Ethyl (B): <10 mg/dL (ref <10)

## 2021-01-23 LAB — POC URINE PREG, ED: Preg Test, Ur: NEGATIVE

## 2021-01-23 LAB — CBG MONITORING, ED: Glucose-Capillary: 140 mg/dL — ABNORMAL HIGH (ref 70–99)

## 2021-01-23 LAB — HEMOGLOBIN A1C
Hgb A1c MFr Bld: 5.2 % (ref 4.8–5.6)
Mean Plasma Glucose: 102.54 mg/dL

## 2021-01-23 LAB — APTT: aPTT: 27 seconds (ref 24–36)

## 2021-01-23 MED ORDER — MECLIZINE HCL 25 MG PO TABS: 25.0000 mg | ORAL_TABLET | Freq: Three times a day (TID) | ORAL | 0 refills | Status: AC | PRN

## 2021-01-23 MED ORDER — ASPIRIN 325 MG PO TABS
325.0000 mg | ORAL_TABLET | ORAL | Status: AC
  Administered 2021-01-23: 325 mg via ORAL
  Filled 2021-01-23: qty 1

## 2021-01-23 MED ORDER — IOHEXOL 350 MG/ML SOLN
75.0000 mL | Freq: Once | INTRAVENOUS | Status: AC | PRN
  Administered 2021-01-23: 75 mL via INTRAVENOUS

## 2021-01-23 MED ORDER — ONDANSETRON HCL 4 MG/2ML IJ SOLN
INTRAMUSCULAR | Status: AC
  Filled 2021-01-23: qty 2

## 2021-01-23 MED ORDER — SODIUM CHLORIDE 0.9 % IV BOLUS
1000.0000 mL | Freq: Once | INTRAVENOUS | Status: AC
  Administered 2021-01-23: 1000 mL via INTRAVENOUS

## 2021-01-23 MED ORDER — ASPIRIN 300 MG RE SUPP: 300.0000 mg | RECTAL | Status: AC

## 2021-01-23 MED ORDER — LORAZEPAM 2 MG/ML IJ SOLN
0.5000 mg | Freq: Once | INTRAMUSCULAR | Status: AC
  Administered 2021-01-23: 0.5 mg via INTRAVENOUS
  Filled 2021-01-23: qty 1

## 2021-01-23 MED ORDER — ONDANSETRON HCL 4 MG/2ML IJ SOLN
4.0000 mg | Freq: Once | INTRAMUSCULAR | Status: AC
  Administered 2021-01-23: 4 mg via INTRAVENOUS

## 2021-01-23 MED ORDER — MORPHINE SULFATE (PF) 4 MG/ML IV SOLN
4.0000 mg | Freq: Once | INTRAVENOUS | Status: AC
Start: 2021-01-23 — End: 2021-01-23
  Administered 2021-01-23: 4 mg via INTRAVENOUS
  Filled 2021-01-23: qty 1

## 2021-01-23 NOTE — Discharge Instructions (Signed)
You will need to follow-up with the neurologist Dr. Gerilyn Pilgrim.  Please call the office to schedule an appointment for follow-up.    You were given a medication called meclizine to help with your dizziness.  Please take with caution and do not drive, operate machinery or drink alcohol while taking this medication as it can make you sleepy.    Please return to the emergency department for any new or worsening symptoms in the meantime.

## 2021-01-23 NOTE — Progress Notes (Signed)
Pt images reviewed. MRI no infarct. CTA head and neck showed no LVO, but left VA origin severe stenosis, however, the VA distal filling post the stenosis showed no decreased flow. Basilar artery and right VA are patent. Overall, pt symptoms more likely complicated migraine. Recommend to further management of her HA. If her symptoms getting better and able to ambulate in ER independently, she can be discharged from neuro standpoint and follow up closely with Dr. Gerilyn Pilgrim as outpt. However, if she still has dizziness and not able to safely ambulate, would recommend overnight observation and have PT/OT evaluation. Discussed with ED PA. Please feel free to call neurology for any further questions. Thanks for the consult.  Marvel Plan, MD PhD Stroke Neurology 01/23/2021 4:40 PM

## 2021-01-23 NOTE — ED Provider Notes (Signed)
[facility] EMERGENCY DEPARTMENT Provider Note   CSN: [id number] Arrival date & time: 01/23/21  1322  An emergency department physician performed an initial assessment on this suspected stroke patient at 1403.  History Chief Complaint  Patient presents with  . Dizziness    Kristy Abbott is a 55 y.o. female.  HPI   55 year old female history of anemia, Graves' disease, hypothyroidism, who presents the emergency department today for evaluation of sudden onset dizziness that occurred while she was working prior to arrival.  Last known well was about 30 minutes prior to arrival.  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  States she had doubled onto things so that she could keep her balance.  States she still feels like the room is spinning.  She has a headache associated with this as well.  She denies any chest pain, shortness of breath, nausea, vomiting or recent illnesses.  She is never had similar symptoms before.  She denies any visual changes, unilateral numbness/weakness, difficulty with word finding or slurred speech.  Past Medical History:  Diagnosis Date  . Anemia   . Graves disease   . Hypothyroidism     Patient Active Problem List   Diagnosis Date Noted  . Special screening for malignant neoplasms, colon     Past Surgical History:  Procedure Laterality Date  . CESAREAN SECTION    . COLONOSCOPY N/A 11/10/2018   Procedure: COLONOSCOPY;  Surgeon: West BaliFields, Sandi L, MD;  Location: [facility];  Service: Endoscopy;  Laterality: N/A;  1:30  . Cyst removed from neck    . TOTAL ABDOMINAL HYSTERECTOMY  04/07/2019   Dr. Ernestina PennaNigel Buist     OB History   No obstetric history on file.     Family History  Problem Relation Age of Onset  . CAD Mother   . Diabetes Mother   . Colon cancer Neg Hx   . Colon polyps Neg Hx     Social History   Tobacco Use  . Smoking status: Former Smoker    Packs/day: 1.00     Types: Cigarettes    Start date: 06/07/1981    Quit date: 10/20/1998    Years since quitting: 22.2  . Smokeless tobacco: Never Used  Vaping Use  . Vaping Use: Never used  Substance Use Topics  . Alcohol use: No  . Drug use: No    Home Medications Prior to Admission medications   Medication Sig Start Date End Date Taking? Authorizing Provider  meclizine (ANTIVERT) 25 MG tablet Take 1 tablet (25 mg total) by mouth 3 (three) times daily as needed for dizziness. 01/23/21  Yes Erol Flanagin S, PA-C  cetirizine (ZYRTEC ALLERGY) 10 MG tablet Take 1 tablet (10 mg total) by mouth daily. 11/05/19   Avegno, Zachery DakinsKomlanvi S, FNP  Ferrous Sulfate (IRON SLOW RELEASE) 142 (45 Fe) MG TBCR Take 45 mg by mouth every other day.    [provider]  levothyroxine (SYNTHROID, LEVOTHROID) 88 MCG tablet Take 88 mcg by mouth daily before breakfast.    [provider]  naproxen sodium (ALEVE) 220 MG tablet Take 440 mg by mouth daily as needed (pain).    [provider]  omeprazole (PRILOSEC) 40 MG capsule Take 40 mg by mouth daily.    [provider]    Allergies    Patient has no known allergies.  Review of Systems   Review of Systems  Constitutional:  Negative for chills and fever.  HENT: Negative for ear pain and sore throat.   Eyes: Negative for visual disturbance.  Respiratory: Negative for cough and shortness of breath.   Cardiovascular: Negative for chest pain.  Gastrointestinal: Negative for abdominal pain, constipation, diarrhea, nausea and vomiting.  Genitourinary: Negative for dysuria and hematuria.  Musculoskeletal: Negative for back pain and neck pain.  Skin: Negative for rash.  Neurological: Positive for dizziness, light-headedness and headaches. Negative for seizures, syncope, weakness and numbness.  All other systems reviewed and are negative.   Physical Exam Updated Vital Signs BP (!) 154/80   Pulse (!) 54   Temp 98.3 F (36.8 C) (Oral)   Resp (!) 23    Ht 5\' 2"  (1.575 m)   Wt 63.5 kg   LMP 04/14/2019   SpO2 100%   BMI 25.61 kg/m   Physical Exam Vitals and nursing note reviewed.  Constitutional:      General: She is not in acute distress.    Appearance: She is well-developed.  HENT:     Head: Normocephalic and atraumatic.  Eyes:     Extraocular Movements: Extraocular movements intact.     Conjunctiva/sclera: Conjunctivae normal.     Pupils: Pupils are equal, round, and reactive to light.     Comments: bilat nystagmus  Cardiovascular:     Rate and Rhythm: Normal rate and regular rhythm.     Heart sounds: Normal heart sounds. No murmur heard.   Pulmonary:     Effort: Pulmonary effort is normal. No respiratory distress.     Breath sounds: Normal breath sounds. No wheezing, rhonchi or rales.  Abdominal:     Palpations: Abdomen is soft.     Tenderness: There is no abdominal tenderness.  Musculoskeletal:     Cervical back: Neck supple.  Skin:    General: Skin is warm and dry.  Neurological:     Mental Status: She is alert.     Comments: Mental Status:  Alert, thought content appropriate, able to give a coherent history. Speech fluent without evidence of aphasia. Able to follow 2 step commands without difficulty.  Cranial Nerves:  II: pupils equal, round, reactive to light III,IV, VI: ptosis not present, extra-ocular motions intact bilaterally  V,VII: smile symmetric, facial light touch sensation equal VIII: hearing grossly normal to voice  X: uvula elevates symmetrically  XI: bilateral shoulder shrug symmetric and strong XII: midline tongue extension without fassiculations Motor:  Normal tone. 5/5 strength of BUE and BLE major muscle groups including strong and equal grip strength and dorsiflexion/plantar flexion Sensory: light touch normal in all extremities. Cerebellar: normal finger-to-nose with bilateral upper extremities, normal heel to shin Gait: gait is slow but appears steady      ED Results / Procedures /  Treatments   Labs (all labs ordered are listed, but only abnormal results are displayed) Labs Reviewed  COMPREHENSIVE METABOLIC PANEL - Abnormal; Notable for the following components:      Result Value   Glucose, Bld 137 (*)    All other components within normal limits  URINALYSIS, ROUTINE W REFLEX MICROSCOPIC - Abnormal; Notable for the following components:   Color, Urine COLORLESS (*)    Hgb urine dipstick SMALL (*)    All other components within normal limits  LIPID PANEL - Abnormal; Notable for the following components:   Cholesterol 234 (*)    LDL Cholesterol 153 (*)    All other components within normal limits  CBG MONITORING, ED - Abnormal; Notable for  the following components:   Glucose-Capillary 140 (*)    All other components within normal limits  RESP PANEL BY RT-PCR (FLU A&B, COVID) ARPGX2  ETHANOL  PROTIME-INR  APTT  CBC  DIFFERENTIAL  RAPID URINE DRUG SCREEN, HOSP PERFORMED  HEMOGLOBIN A1C  I-STAT CHEM 8, ED  POC URINE PREG, ED    EKG None  Radiology CT ANGIO HEAD W OR WO CONTRAST  Result Date: 01/23/2021 CLINICAL DATA:  Stroke/TIA.  Headache and dizziness. EXAM: CT ANGIOGRAPHY HEAD AND NECK TECHNIQUE: Multidetector CT imaging of the head and neck was performed using the standard protocol during bolus administration of intravenous contrast. Multiplanar CT image reconstructions and MIPs were obtained to evaluate the vascular anatomy. Carotid stenosis measurements (when applicable) are obtained utilizing NASCET criteria, using the distal internal carotid diameter as the denominator. CONTRAST:  15mL OMNIPAQUE IOHEXOL 350 MG/ML SOLN COMPARISON:  Head CT and MRI January 23, 2021. FINDINGS: CTA NECK FINDINGS Aortic arch: Standard branching. Imaged portion shows no evidence of aneurysm or dissection. No significant stenosis of the major arch vessel origins. Right carotid system: Mild atherosclerotic changes of the right carotid bifurcation without hemodynamically significant  stenosis. Left carotid system: Minimal atherosclerotic changes the left carotid bifurcation without hemodynamically significant stenosis. Vertebral arteries: Codominant. Atherosclerotic changes at the origin of the left vertebral artery resulting in severe stenosis. Minimal atherosclerotic changes at the origin of the right vertebral artery without hemodynamically significant stenosis. Remainder of the cervical course of the bilateral vertebral arteries appear preserved. Skeleton: Negative. Other neck: No acute findings. Upper chest: No acute findings. Review of the MIP images confirms the above findings CTA HEAD FINDINGS Anterior circulation: No significant stenosis, proximal occlusion, aneurysm, or vascular malformation. Posterior circulation: Calcified plaque in the intracranial right vertebral artery. No significant stenosis, proximal occlusion, aneurysm, or vascular malformation. Venous sinuses: As permitted by contrast timing, patent. Review of the MIP images confirms the above findings IMPRESSION: 1. No large vessel occlusion or evidence of dissection. 2. 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 3. No hemodynamically significant stenosis in the head. Electronically Signed   By: Baldemar Lenis M.D.   On: 01/23/2021 16:09   CT ANGIO NECK W OR WO CONTRAST  Result Date: 01/23/2021 CLINICAL DATA:  Stroke/TIA.  Headache and dizziness. EXAM: CT ANGIOGRAPHY HEAD AND NECK TECHNIQUE: Multidetector CT imaging of the head and neck was performed using the standard protocol during bolus administration of intravenous contrast. Multiplanar CT image reconstructions and MIPs were obtained to evaluate the vascular anatomy. Carotid stenosis measurements (when applicable) are obtained utilizing NASCET criteria, using the distal internal carotid diameter as the denominator. CONTRAST:  102mL OMNIPAQUE IOHEXOL 350 MG/ML SOLN COMPARISON:  Head CT and MRI January 23, 2021. FINDINGS: CTA NECK FINDINGS  Aortic arch: Standard branching. Imaged portion shows no evidence of aneurysm or dissection. No significant stenosis of the major arch vessel origins. Right carotid system: Mild atherosclerotic changes of the right carotid bifurcation without hemodynamically significant stenosis. Left carotid system: Minimal atherosclerotic changes the left carotid bifurcation without hemodynamically significant stenosis. Vertebral arteries: Codominant. Atherosclerotic changes at the origin of the left vertebral artery resulting in severe stenosis. Minimal atherosclerotic changes at the origin of the right vertebral artery without hemodynamically significant stenosis. Remainder of the cervical course of the bilateral vertebral arteries appear preserved. Skeleton: Negative. Other neck: No acute findings. Upper chest: No acute findings. Review of the MIP images confirms the above findings CTA HEAD FINDINGS Anterior circulation: No significant stenosis,  proximal occlusion, aneurysm, or vascular malformation. Posterior circulation: Calcified plaque in the intracranial right vertebral artery. No significant stenosis, proximal occlusion, aneurysm, or vascular malformation. Venous sinuses: As permitted by contrast timing, patent. Review of the MIP images confirms the above findings IMPRESSION: 1. No large vessel occlusion or evidence of dissection. 2. 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 3. No hemodynamically significant stenosis in the head. Electronically Signed   By: Baldemar Lenis M.D.   On: 01/23/2021 16:09   MR BRAIN WO CONTRAST  Result Date: 01/23/2021 CLINICAL DATA:  Sudden onset of dizziness.  Stroke. EXAM: MRI HEAD WITHOUT CONTRAST TECHNIQUE: Multiplanar, multiecho pulse sequences of the brain and surrounding structures were obtained without intravenous contrast. COMPARISON:  CT head 01/23/2021 FINDINGS: Brain: No acute infarction, hemorrhage, hydrocephalus, extra-axial collection or mass  lesion. Normal white matter. Normal brainstem. Vascular: Normal arterial flow voids. Skull and upper cervical spine: Negative Sinuses/Orbits: Negative Other: None IMPRESSION: Normal MRI of the head. Electronically Signed   By: Marlan Palau M.D.   On: 01/23/2021 15:55   CT HEAD CODE STROKE WO CONTRAST  Result Date: 01/23/2021 CLINICAL DATA:  Code stroke.  Headache and dizziness EXAM: CT HEAD WITHOUT CONTRAST TECHNIQUE: Contiguous axial images were obtained from the base of the skull through the vertex without intravenous contrast. COMPARISON:  None. FINDINGS: Brain: No intracranial hemorrhage, mass effect, or edema. Gray-white differentiation is preserved. No extra-axial collection. Ventricles and sulci are normal in size and configuration. Vascular: No hyperdense vessel. Skull: Unremarkable. Sinuses/Orbits: Aerated. Other: Mastoid air cells are clear. ASPECTS (Alberta Stroke Program Early CT Score) - Ganglionic level infarction (caudate, lentiform nuclei, internal capsule, insula, M1-M3 cortex): 7 - Supraganglionic infarction (M4-M6 cortex): 3 Total score (0-10 with 10 being normal): 10 IMPRESSION: There is no acute intracranial hemorrhage or evidence of acute infarction. ASPECT score is 10. These results were called by telephone at the time of interpretation on 01/23/2021 at 2:19 pm to provider [facility] , who verbally acknowledged these results. Electronically Signed   By: Guadlupe Spanish M.D.   On: 01/23/2021 14:21    Procedures Procedures   Medications Ordered in ED Medications  LORazepam (ATIVAN) injection 0.5 mg (0.5 mg Intravenous Given 01/23/21 1407)  morphine 4 MG/ML injection 4 mg (4 mg Intravenous Given 01/23/21 1648)  sodium chloride 0.9 % bolus 1,000 mL (0 mLs Intravenous Stopped 01/23/21 1643)  ondansetron (ZOFRAN) injection 4 mg (4 mg Intravenous Given 01/23/21 1406)  aspirin tablet 325 mg (325 mg Oral Given 01/23/21 1648)    Or  aspirin suppository 300 mg ( Rectal See Alternative 01/23/21  1648)  iohexol (OMNIPAQUE) 350 MG/ML injection 75 mL (75 mLs Intravenous Contrast Given 01/23/21 1556)    ED Course  I have reviewed the triage vital signs and the nursing notes.  Pertinent labs & imaging results that were available during my care of the patient were reviewed by me and considered in my medical decision making (see chart for details).    MDM Rules/Calculators/A&P                         55 year old female presenting emergency department today for evaluation of sudden onset dizziness that occurred about 30 minutes prior to arrival.  Due to sudden onset of persistent dizziness and concern for possible posterior circulation stroke, code stroke was initiated after evaluation.  Neurology evaluated patient and ordered imaging studies and made recommendations.  Reviewed/interpreted labs CBC unremarkable CMP  with mildly elevated glucose Coags negative EtOH negative Hemoglobin A1c pending Lipid panel abnormal Covid negative UA without evidence of infection  CT head negative CTA of the head and neck shows 1. No large vessel occlusion or evidence of dissection. 2. 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 3. No hemodynamically significant stenosis in the head MRI brain - Normal MRI of the head  Work-up here is reassuring and does not show any evidence of a stroke.  I spoke with Dr. Roda Shutters with neurology who states that if the patient's symptoms have resolved then she can likely be discharged home with outpatient follow-up with neurology however if she is still significantly symptomatic and unable to walk independently then she will likely need to be admitted for PT OT eval.  Reassessed patient.  She states she is feeling better after medications.  Discussed option for admission for further management of symptoms versus discharge with outpatient neurology follow-up and Rx for meclizine.  She would prefer to be discharged at this time.  We discussed the results and plan  for follow-up.  She voices understanding of the plan and reasons to return.  All questions answered.  Patient stable for discharge.  Final Clinical Impression(s) / ED Diagnoses Final diagnoses:  Dizziness    Rx / DC Orders ED Discharge Orders         Ordered    meclizine (ANTIVERT) 25 MG tablet  3 times daily PRN        01/23/21 1706           Jamani Bearce S, PA-C 01/23/21 1707    Terrilee Files, MD 01/24/21 1213

## 2021-01-23 NOTE — ED Triage Notes (Signed)
Pt to er room number 7, pt states that about 30 minutes ago she had a sudden onset of dizziness, states that it feels like the room is spinning around her, denies unilateral weakness, states that she feels generally weak, states that she also has some sob, pt tearful.  Denies hx of vertigo. No slurred speech noted.

## 2021-01-23 NOTE — Progress Notes (Signed)
Code Stroke Time Documentation   1359 Call Time 1400 Beeper Time 1411 Exam Started  1416 Exam Finished 1416 Images sent to [facility] 1416 Exam completed in Epic  [address] [facility] radiology called

## 2021-01-23 NOTE — Consult Note (Signed)
[facility] Telemedicine Consult   Requesting Provider: Dr. Estell Harpin  Chief Complaint: dizziness  HPI: 55 year old female with history of Graves' disease now hypothyroidism on Synthroid presented to ED for acute onset dizziness.  Patient stated that she was working in pharmacy as a Associate Professor, and around 1 PM she developed acute onset lightheadedness, feeling of passing out and had to sit down.  Then she felt room spinning sensation, nauseous but no vomiting.  Gradually she felt better, she was able to go to eat, on the way back she felt again dizziness, imbalance walking, had to sit down.  In meantime she also had headache, bifrontal, throbbing headache as well as left hand and foot numbness.  She was sent to ER for evaluation.  In ER, BP 154/89, glucose 140.  She continues to have headache, felt dizziness and headache but better than it was.  Left hand and foot numbness resolved.  CT no acute abnormality.  She denies any arm leg weakness or speech changes.  However, she did complaining of blurry vision but not vision loss.  She denies any history of migraine, no such episode in the past.  She is not taking any antiplatelet or anticoagulation.  LKW: 1 PM tpa given?: No, nondiabling symptoms IR Thrombectomy? No Modified Rankin Scale: 0-Completely asymptomatic and back to baseline post- stroke   Exam: Vitals:   01/23/21 1417 01/23/21 1430  BP: (!) 177/95 (!) 162/95  Pulse: (!) 53 (!) 55  Resp: (!) 29 12  Temp:    SpO2: 91% 97%     Temp:  [98.3 F (36.8 C)] 98.3 F (36.8 C) (04/06 1337) Pulse Rate:  [53-78] 55 (04/06 1430) Resp:  [12-29] 12 (04/06 1430) BP: (154-177)/(89-97) 162/95 (04/06 1430) SpO2:  [91 %-100 %] 97 % (04/06 1430) Weight:  [63.5 kg] 63.5 kg (04/06 1338)  General - Well nourished, well developed, in acute distress with headache and dizziness.  Ophthalmologic - fundi not visualized due to noncooperation.  Cardiovascular - Regular rhythm and  rate.  Neuro - awake, alert, prefer eyes closed, orientated to age, place, time. No aphasia, fluent language, following all simple commands. Able to name and repeat and read. No gaze palsy, tracking bilaterally, visual field full, no nystagmus, no disconjugate eyes. No facial droop. Tongue midline. Bilateral UEs 5/5, no drift. Bilaterally LEs 5/5, no drift. Sensation symmetrical bilaterally except left LE mildly decreased light touch sensation, b/l finger-to-nose and heel-to-shin intact, gait not tested.  Marland Kitchen   NIH Stroke Scale  Level Of Consciousness 0=Alert; keenly responsive 1=Arouse to minor stimulation 2=Requires repeated stimulation to arouse or movements to pain 3=postures or unresponsive 0  LOC Questions to Month and Age 83=Answers both questions correctly 1=Answers one question correctly or dysarthria/intubated/trauma/language barrier 2=Answers neither question correctly or aphasia 0  LOC Commands      -Open/Close eyes     -Open/close grip     -Pantomime commands if communication barrier 0=Performs both tasks correctly 1=Performs one task correctly 2=Performs neighter task correctly 0  Best Gaze     -Only assess horizontal gaze 0=Normal 1=Partial gaze palsy 2=Forced deviation, or total gaze paresis 0  Visual 0=No visual loss 1=Partial hemianopia 2=Complete hemianopia 3=Bilateral hemianopia (blind including cortical blindness) 0  Facial Palsy     -Use grimace if obtunded 0=Normal symmetrical movement 1=Minor paralysis (asymmetry) 2=Partial paralysis (lower face) 3=Complete paralysis (upper and lower face) 0  Motor  0=No drift for 10/5 seconds 1=Drift, but does not hit bed 2=Some antigravity effort, hits  bed 3=No effort against gravity, limb falls 4=No movement 0=Amputation/joint fusion Right Arm 0     Leg 0    Left Arm 0     Leg 0  Limb Ataxia     - FNT/HTS 0=Absent or does not understand or paralyzed or amputation/joint fusion 1=Present in one limb 2=Present in  two limbs 0  Sensory 0=Normal 1=Mild to moderate sensory loss 2=Severe to total sensory loss or coma/unresponsive 1  Best Language 0=No aphasia, normal 1=Mild to moderate aphasia 2=Severe aphasia 3=Mute, global aphasia, or coma/unresponsive 0  Dysarthria 0=Normal 1=Mild to moderate 2=Severe, unintelligible or mute/anarthric 0=intubated/unable to test 0  Extinction/Neglect 0=No abnormality 1=visual/tactile/auditory/spatia/personal inattention/Extinction to bilateral simultaneous stimulation 2=Profound neglect/extinction more than 1 modality  0  Total   1      Imaging Reviewed:  CT HEAD CODE STROKE WO CONTRAST  Result Date: 01/23/2021 CLINICAL DATA:  Code stroke.  Headache and dizziness EXAM: CT HEAD WITHOUT CONTRAST TECHNIQUE: Contiguous axial images were obtained from the base of the skull through the vertex without intravenous contrast. COMPARISON:  None. FINDINGS: Brain: No intracranial hemorrhage, mass effect, or edema. Gray-white differentiation is preserved. No extra-axial collection. Ventricles and sulci are normal in size and configuration. Vascular: No hyperdense vessel. Skull: Unremarkable. Sinuses/Orbits: Aerated. Other: Mastoid air cells are clear. ASPECTS (Alberta Stroke Program Early CT Score) - Ganglionic level infarction (caudate, lentiform nuclei, internal capsule, insula, M1-M3 cortex): 7 - Supraganglionic infarction (M4-M6 cortex): 3 Total score (0-10 with 10 being normal): 10 IMPRESSION: There is no acute intracranial hemorrhage or evidence of acute infarction. ASPECT score is 10. These results were called by telephone at the time of interpretation on 01/23/2021 at 2:19 pm to provider [facility] , who verbally acknowledged these results. Electronically Signed   By: Guadlupe Spanish M.D.   On: 01/23/2021 14:21    Assessment:  55 year old female with history of Graves' disease now hypothyroidism on Synthroid presented to ED for acute onset dizziness, lightheadedness, room  spinning, HA, imbalance. Time onset 1pm, NIHSS = 1 for left LE mild light touch sensation decrease. CT no acute abnormality.  Pt not tPA candidate now given nondisabling symptoms and no significant focal deficit, likely non stroke. However, given her young age, will request stat MRI to rule out stroke. If stroke found and she still within the 4.5 hour tPA window, she can still be tPA candidate. Will also do CTA head and neck for vessel evaluation. Will give ASA 325 stat if passed swallow. Otherwise, ASA PR 300. Discussed with Dr. Estell Harpin.   Recommendations:  - Continue further work up  - Frequent neuro checks - Telemetry monitoring - MRI brain stat.  If stroke found and she still within the 4.5 hour TPA window, she can still be a TPA candidate. - CTA head and neck after MRI - Echocardiogram  - UDS, fasting lipid panel and HgbA1C - PT/OT/speech consult - Permissive hypertension (only treat if BP > 220/120 unless a lower blood pressure is clinically necessary) for 24-48 hours post stroke onset - Stat aspirin 325 mg if passed swallow, otherwise ASA PR 300 mg - Further evaluation as per inpatient neurology recommendations  - Discussed with Dr. Estell Harpin ED physician - We will follow   Consult Participants: me, tele RN Kimmie, local RN Location of the provider: [facility] Location of the patient: [facility] ED  This consult was provided via telemedicine with 2-way video and audio communication. The patient/family was informed that care would be provided in this way and agreed  to receive care in this manner.   This patient is receiving care for possible acute neurological changes. There was 50 minutes of care by this provider at the time of service, including time for direct evaluation via telemedicine, review of medical records, imaging studies and discussion of findings with providers, the patient and/or family.  Marvel Plan, MD PhD Stroke Neurology 01/23/2021 2:40 PM

## 2021-03-27 ENCOUNTER — Other Ambulatory Visit (HOSPITAL_COMMUNITY): Payer: Self-pay | Admitting: Neurology

## 2021-03-27 DIAGNOSIS — G959 Disease of spinal cord, unspecified: Secondary | ICD-10-CM

## 2021-04-10 ENCOUNTER — Ambulatory Visit (HOSPITAL_COMMUNITY)
Admission: RE | Admit: 2021-04-10 | Discharge: 2021-04-10 | Disposition: A | Discharge status: Home / Self Care | Payer: 59 | Source: Ambulatory Visit | Attending: Neurology | Admitting: Neurology

## 2021-04-10 DIAGNOSIS — G959 Disease of spinal cord, unspecified: Secondary | ICD-10-CM | POA: Insufficient documentation

## 2022-01-26 ENCOUNTER — Emergency Department (HOSPITAL_COMMUNITY): Payer: 59

## 2022-01-26 ENCOUNTER — Encounter (HOSPITAL_COMMUNITY): Payer: Self-pay

## 2022-01-26 ENCOUNTER — Inpatient Hospital Stay (HOSPITAL_COMMUNITY)
Admission: EM | Admit: 2022-01-26 | Discharge: 2022-01-30 | DRG: 064 | Disposition: A | Discharge status: Home / Self Care | Payer: 59 | Attending: Internal Medicine | Admitting: Internal Medicine

## 2022-01-26 ENCOUNTER — Other Ambulatory Visit: Payer: Self-pay

## 2022-01-26 DIAGNOSIS — Z20822 Contact with and (suspected) exposure to covid-19: Secondary | ICD-10-CM | POA: Diagnosis present

## 2022-01-26 DIAGNOSIS — I63212 Cerebral infarction due to unspecified occlusion or stenosis of left vertebral arteries: Secondary | ICD-10-CM | POA: Diagnosis present

## 2022-01-26 DIAGNOSIS — I63219 Cerebral infarction due to unspecified occlusion or stenosis of unspecified vertebral arteries: Secondary | ICD-10-CM | POA: Diagnosis not present

## 2022-01-26 DIAGNOSIS — Z79890 Hormone replacement therapy (postmenopausal): Secondary | ICD-10-CM

## 2022-01-26 DIAGNOSIS — I7774 Dissection of vertebral artery: Secondary | ICD-10-CM | POA: Diagnosis present

## 2022-01-26 DIAGNOSIS — R110 Nausea: Secondary | ICD-10-CM

## 2022-01-26 DIAGNOSIS — R29703 NIHSS score 3: Secondary | ICD-10-CM | POA: Diagnosis not present

## 2022-01-26 DIAGNOSIS — K219 Gastro-esophageal reflux disease without esophagitis: Secondary | ICD-10-CM | POA: Diagnosis not present

## 2022-01-26 DIAGNOSIS — R29702 NIHSS score 2: Secondary | ICD-10-CM | POA: Diagnosis not present

## 2022-01-26 DIAGNOSIS — I6389 Other cerebral infarction: Secondary | ICD-10-CM | POA: Diagnosis not present

## 2022-01-26 DIAGNOSIS — E0500 Thyrotoxicosis with diffuse goiter without thyrotoxic crisis or storm: Secondary | ICD-10-CM | POA: Diagnosis present

## 2022-01-26 DIAGNOSIS — E785 Hyperlipidemia, unspecified: Secondary | ICD-10-CM | POA: Diagnosis present

## 2022-01-26 DIAGNOSIS — Z87891 Personal history of nicotine dependence: Secondary | ICD-10-CM

## 2022-01-26 DIAGNOSIS — Z8673 Personal history of transient ischemic attack (TIA), and cerebral infarction without residual deficits: Secondary | ICD-10-CM | POA: Diagnosis not present

## 2022-01-26 DIAGNOSIS — R42 Dizziness and giddiness: Secondary | ICD-10-CM | POA: Diagnosis present

## 2022-01-26 DIAGNOSIS — D509 Iron deficiency anemia, unspecified: Secondary | ICD-10-CM | POA: Diagnosis present

## 2022-01-26 DIAGNOSIS — Z90710 Acquired absence of both cervix and uterus: Secondary | ICD-10-CM | POA: Diagnosis not present

## 2022-01-26 DIAGNOSIS — R531 Weakness: Secondary | ICD-10-CM | POA: Diagnosis not present

## 2022-01-26 DIAGNOSIS — Z79899 Other long term (current) drug therapy: Secondary | ICD-10-CM | POA: Diagnosis not present

## 2022-01-26 DIAGNOSIS — E039 Hypothyroidism, unspecified: Secondary | ICD-10-CM | POA: Diagnosis not present

## 2022-01-26 DIAGNOSIS — R2990 Unspecified symptoms and signs involving the nervous system: Secondary | ICD-10-CM | POA: Diagnosis not present

## 2022-01-26 DIAGNOSIS — R519 Headache, unspecified: Secondary | ICD-10-CM | POA: Diagnosis present

## 2022-01-26 DIAGNOSIS — I6509 Occlusion and stenosis of unspecified vertebral artery: Secondary | ICD-10-CM | POA: Diagnosis present

## 2022-01-26 DIAGNOSIS — M542 Cervicalgia: Secondary | ICD-10-CM | POA: Diagnosis present

## 2022-01-26 DIAGNOSIS — I639 Cerebral infarction, unspecified: Secondary | ICD-10-CM | POA: Diagnosis present

## 2022-01-26 LAB — URINALYSIS, ROUTINE W REFLEX MICROSCOPIC
Bilirubin Urine: NEGATIVE
Glucose, UA: NEGATIVE mg/dL
Ketones, ur: NEGATIVE mg/dL
Leukocytes,Ua: NEGATIVE
Nitrite: NEGATIVE
Protein, ur: NEGATIVE mg/dL
Specific Gravity, Urine: 1.004 — ABNORMAL LOW (ref 1.005–1.030)
pH: 7 (ref 5.0–8.0)

## 2022-01-26 LAB — CBC
HCT: 41.8 % (ref 36.0–46.0)
Hemoglobin: 13.9 g/dL (ref 12.0–15.0)
MCH: 30.3 pg (ref 26.0–34.0)
MCHC: 33.3 g/dL (ref 30.0–36.0)
MCV: 91.1 fL (ref 80.0–100.0)
Platelets: 224 10*3/uL (ref 150–400)
RBC: 4.59 MIL/uL (ref 3.87–5.11)
RDW: 12.3 % (ref 11.5–15.5)
WBC: 5.9 10*3/uL (ref 4.0–10.5)
nRBC: 0 % (ref 0.0–0.2)

## 2022-01-26 LAB — BASIC METABOLIC PANEL
Anion gap: 8 (ref 5–15)
BUN: 16 mg/dL (ref 6–20)
CO2: 24 mmol/L (ref 22–32)
Calcium: 9.7 mg/dL (ref 8.9–10.3)
Chloride: 107 mmol/L (ref 98–111)
Creatinine, Ser: 0.67 mg/dL (ref 0.44–1.00)
GFR, Estimated: >60 mL/min (ref >60)
Glucose, Bld: 113 mg/dL — ABNORMAL HIGH (ref 70–99)
Potassium: 3.8 mmol/L (ref 3.5–5.1)
Sodium: 139 mmol/L (ref 135–145)

## 2022-01-26 LAB — DIFFERENTIAL
Abs Immature Granulocytes: 0.02 10*3/uL (ref 0.00–0.07)
Basophils Absolute: 0 10*3/uL (ref 0.0–0.1)
Basophils Relative: 0 %
Eosinophils Absolute: 0.1 10*3/uL (ref 0.0–0.5)
Eosinophils Relative: 1 %
Immature Granulocytes: 0 %
Lymphocytes Relative: 38 %
Lymphs Abs: 1.7 10*3/uL (ref 0.7–4.0)
Monocytes Absolute: 0.3 10*3/uL (ref 0.1–1.0)
Monocytes Relative: 7 %
Neutro Abs: 2.4 10*3/uL (ref 1.7–7.7)
Neutrophils Relative %: 54 %

## 2022-01-26 LAB — PROTIME-INR
INR: 1 (ref 0.8–1.2)
Prothrombin Time: 13.2 seconds (ref 11.4–15.2)

## 2022-01-26 LAB — APTT: aPTT: 31 seconds (ref 24–36)

## 2022-01-26 LAB — RESP PANEL BY RT-PCR (FLU A&B, COVID) ARPGX2
Influenza A by PCR: NEGATIVE
Influenza B by PCR: NEGATIVE
SARS Coronavirus 2 by RT PCR: NEGATIVE

## 2022-01-26 LAB — ETHANOL: Alcohol, Ethyl (B): <10 mg/dL (ref <10)

## 2022-01-26 MED ORDER — MORPHINE SULFATE (PF) 4 MG/ML IV SOLN
4.0000 mg | Freq: Once | INTRAVENOUS | Status: AC
  Administered 2022-01-26: 4 mg via INTRAVENOUS
  Filled 2022-01-26: qty 1

## 2022-01-26 MED ORDER — ACETAMINOPHEN 325 MG PO TABS
650.0000 mg | ORAL_TABLET | Freq: Four times a day (QID) | ORAL | Status: DC | PRN
  Administered 2022-01-27 – 2022-01-30 (×4): 650 mg via ORAL
  Filled 2022-01-26 (×4): qty 2

## 2022-01-26 MED ORDER — DIPHENHYDRAMINE HCL 50 MG/ML IJ SOLN
12.5000 mg | Freq: Once | INTRAMUSCULAR | Status: AC
Start: 2022-01-26 — End: 2022-01-26
  Administered 2022-01-26: 12.5 mg via INTRAVENOUS
  Filled 2022-01-26: qty 1

## 2022-01-26 MED ORDER — HEPARIN (PORCINE) 25000 UT/250ML-% IV SOLN
1000.0000 [IU]/h | INTRAVENOUS | Status: DC
  Administered 2022-01-26: 850 [IU]/h via INTRAVENOUS
  Administered 2022-01-27: 1000 [IU]/h via INTRAVENOUS
  Filled 2022-01-26 (×2): qty 250

## 2022-01-26 MED ORDER — ONDANSETRON HCL 4 MG/2ML IJ SOLN
4.0000 mg | Freq: Once | INTRAMUSCULAR | Status: AC
  Administered 2022-01-26: 4 mg via INTRAVENOUS
  Filled 2022-01-26: qty 2

## 2022-01-26 MED ORDER — ACETAMINOPHEN 650 MG RE SUPP: 650.0000 mg | Freq: Four times a day (QID) | RECTAL | Status: DC | PRN

## 2022-01-26 MED ORDER — IOHEXOL 350 MG/ML SOLN
75.0000 mL | Freq: Once | INTRAVENOUS | Status: AC | PRN
Start: 2022-01-26 — End: 2022-01-26
  Administered 2022-01-26: 75 mL via INTRAVENOUS

## 2022-01-26 NOTE — H&P (Signed)
?History and Physical  ? ? ?Patient: Kristy Abbott [id number] DOB: 1966-06-27 ?DOA: 01/26/2022 ?DOS: the patient was seen and examined on 01/27/2022 ?PCP: Monico Blitz, MD  ?Patient coming from: Home ? ?Chief Complaint:  ?Chief Complaint  ?Patient presents with  ? Dizziness  ? ?HPI: Kristy Abbott is a 56 y.o. female with medical history significant for hypothyroidism, GERD, iron deficiency anemia who presents to the emergency department due to dizziness which she sustained earlier today at home.  Patient states that she bent over to put her dog on the floor, on standing up when she became dizzy, though she did not fall.  She continued to have dizziness which became associated with nausea without vomiting and she also complained of headache at the back of the head as well as blurry vision and tingling sensation in the same area.   ?Patient was noted to be seen in the ED on 01/19/2021 due to dizziness, lightheadedness and headaches, she was treated with IV Ativan and morphine, IV hydration was provided and aspirin 325 mg x 1 was given.  Code stroke was initiated at that time in the ED, teleneurology was consulted, CTA of head and neck showed 1.  No large vessel occlusion or evidence of dissection. 2. 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 3. No hemodynamically significant stenosis in the head MRI brain - Normal MRI of the head. ?Neurology (Dr. Erlinda Hong) was consulted at that time and states that patient's symptoms have resolved, she can be discharged home with outpatient follow-up with neurology, however patient will need to be admitted for PT/OT eval if she continues to be symptomatic and unable to walk independently.  Patient was discharged home with meclizine from the ED. ? ?ED Course:  ?In the emergency department, she was bradycardic, BP was 174/98.  Work-up in the ED showed normal CBC and BMP, urine drug screen was negative, alcohol level was less than 10, urinalysis was unimpressive for UTI.   Influenza A, B, SARS coronavirus 2 was negative. ?CT angiography head and neck showed Long segment narrowing and multifocal complete loss of opacification of the left vertebral artery, new since 01/23/2021, consistent with dissection. ?She was treated with IV morphine, Benadryl, Zofran ?Neurologist (Dr. Rory Percy) was consulted and recommended transferring patient to [facility] and also recommended MRI of the brain without contrast as well as heparin drip ? ?Review of Systems: ?Review of systems as noted in the HPI. All other systems reviewed and are negative. ? ? ?Past Medical History:  ?Diagnosis Date  ? Anemia   ? Graves disease   ? Hypothyroidism   ? ?Past Surgical History:  ?Procedure Laterality Date  ? CESAREAN SECTION    ? COLONOSCOPY N/A 11/10/2018  ? Procedure: COLONOSCOPY;  Surgeon: Danie Binder, MD;  Location: [facility];  Service: Endoscopy;  Laterality: N/A;  1:30  ? Cyst removed from neck    ? TOTAL ABDOMINAL HYSTERECTOMY  04/07/2019  ? Dr. Gari Crown  ? ? ?Social History:  reports that she quit smoking about 23 years ago. Her smoking use included cigarettes. She started smoking about 40 years ago. She smoked an average of 1 pack per day. She has never used smokeless tobacco. She reports that she does not drink alcohol and does not use drugs. ? ? ?No Known Allergies ? ?Family History  ?Problem Relation Age of Onset  ? CAD Mother   ? Diabetes Mother   ? Colon cancer Neg Hx   ? Colon  polyps Neg Hx   ?  ? ?Prior to Admission medications   ?Medication Sig Start Date End Date Taking? Authorizing Provider  ?cetirizine (ZYRTEC ALLERGY) 10 MG tablet Take 1 tablet (10 mg total) by mouth daily. 11/05/19   Emerson Monte, FNP  ?Ferrous Sulfate (IRON SLOW RELEASE) 142 (45 Fe) MG TBCR Take 45 mg by mouth every other day.    [provider]  ?levothyroxine (SYNTHROID, LEVOTHROID) 88 MCG tablet Take 88 mcg by mouth daily before breakfast.    [provider]  ?meclizine (ANTIVERT) 25 MG tablet  Take 1 tablet (25 mg total) by mouth 3 (three) times daily as needed for dizziness. 01/23/21   Couture, Cortni S, PA-C  ?naproxen sodium (ALEVE) 220 MG tablet Take 440 mg by mouth daily as needed (pain).    [provider]  ?omeprazole (PRILOSEC) 40 MG capsule Take 40 mg by mouth daily.    [provider]  ? ? ?Physical Exam: ?BP 139/90   Pulse (!) 53   Temp 97.7 ?F (36.5 ?C) (Oral)   Resp 12   Ht 5\' 2"  (1.575 m)   Wt 69.9 kg   LMP 04/14/2019   SpO2 99%   BMI 28.17 kg/m?  ? ?General: 56 y.o. year-old female ill appearing, but in no acute distress.  Alert and oriented x3. ?HEENT: NCAT, EOMI ?Neck: Supple, trachea medial ?Cardiovascular: Bradycardia.  Regular rate and rhythm with no rubs or gallops.  No thyromegaly or JVD noted.  No lower extremity edema. 2/4 pulses in all 4 extremities. ?Respiratory: Clear to auscultation with no wheezes or rales. Good inspiratory effort. ?Abdomen: Soft, nontender nondistended with normal bowel sounds x4 quadrants. ?Muskuloskeletal: No cyanosis, clubbing or edema noted bilaterally ?Neuro: CN II-XII intact,  sensation, reflexes intact ?Skin: No ulcerative lesions noted or rashes ?Psychiatry: Judgement and insight appear normal. Mood is appropriate for condition and setting ?   ?   ?   ?Labs on Admission:  ?Basic Metabolic Panel: ?Recent Labs  ?Lab 01/26/22 ?[id number]  ?NA 139  ?K 3.8  ?CL 107  ?CO2 24  ?GLUCOSE 113*  ?BUN 16  ?CREATININE 0.67  ?CALCIUM 9.7  ? ?Liver Function Tests: ?No results for input(s): AST, ALT, ALKPHOS, BILITOT, PROT, ALBUMIN in the last 168 hours. ?No results for input(s): LIPASE, AMYLASE in the last 168 hours. ?No results for input(s): AMMONIA in the last 168 hours. ?CBC: ?Recent Labs  ?Lab 01/26/22 ?[id number] 01/26/22 ?2305  ?WBC 5.9  --   ?NEUTROABS  --  2.4  ?HGB 13.9  --   ?HCT 41.8  --   ?MCV 91.1  --   ?PLT 224  --   ? ?Cardiac Enzymes: ?No results for input(s): CKTOTAL, CKMB, CKMBINDEX, TROPONINI in the last 168 hours. ? ?BNP (last 3  results) ?No results for input(s): BNP in the last 8760 hours. ? ?ProBNP (last 3 results) ?No results for input(s): PROBNP in the last 8760 hours. ? ?CBG: ?No results for input(s): GLUCAP in the last 168 hours. ? ?Radiological Exams on Admission: ?CT Angio Head W or Wo Contrast ? ?Result Date: 01/26/2022 ?CLINICAL DATA:  Dizziness and nausea EXAM: CT ANGIOGRAPHY HEAD AND NECK TECHNIQUE: Multidetector CT imaging of the head and neck was performed using the standard protocol during bolus administration of intravenous contrast. Multiplanar CT image reconstructions and MIPs were obtained to evaluate the vascular anatomy. Carotid stenosis measurements (when applicable) are obtained utilizing NASCET criteria, using the distal internal carotid diameter as the denominator. RADIATION DOSE REDUCTION:  This exam was performed according to the departmental dose-optimization program which includes automated exposure control, adjustment of the mA and/or kV according to patient size and/or use of iterative reconstruction technique. CONTRAST:  55mL OMNIPAQUE IOHEXOL 350 MG/ML SOLN COMPARISON:  01/23/2021 FINDINGS: CTA NECK FINDINGS SKELETON: There is no bony spinal canal stenosis. No lytic or blastic lesion. OTHER NECK: Normal pharynx, larynx and major salivary glands. No cervical lymphadenopathy. Unremarkable thyroid gland. UPPER CHEST: No pneumothorax or pleural effusion. No nodules or masses. AORTIC ARCH: There is no calcific atherosclerosis of the aortic arch. There is no aneurysm, dissection or hemodynamically significant stenosis of the visualized portion of the aorta. Conventional 3 vessel aortic branching pattern. The visualized proximal subclavian arteries are widely patent. RIGHT CAROTID SYSTEM: Normal without aneurysm, dissection or stenosis. LEFT CAROTID SYSTEM: Normal without aneurysm, dissection or stenosis. VERTEBRAL ARTERIES: Codominant configuration. 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 Right vertebral artery is normal. CTA HEAD FINDINGS POSTERIOR CIRCULATION: --Vertebral arteries: Limited opacification o

## 2022-01-26 NOTE — ED Provider Notes (Signed)
?[facility] EMERGENCY DEPARTMENT ?Provider Note ? ? ?CSN: [id number] ?Arrival date & time: 01/26/22  1502 ? ?  ? ?History ? ?Chief Complaint  ?Patient presents with  ? Dizziness  ? ? ?Kristy MaidensCarla D Barbier is a 56 y.o. female. With past medical history of hypothyroidism who presents to the emergency department with dizziness.  ? ?States that around 1:30 PM she was carrying her dog when she went to put him down on the floor.  She states that she bent over, stood up and became extremely dizzy.  She states that she did not fall but had to catch herself on the windowsill.  She states that since doing this she has had constant dizziness associated with nausea without vomiting.  She also has headache.  She endorses somewhat blurred vision as well as "tingling sensation" to the back of her head.  She states that she had an episode of dizziness at work about 6 months ago but did not persist like this.   ? ? ?Dizziness ?Associated symptoms: headaches and nausea   ?Associated symptoms: no vomiting and no weakness   ? ?  ? ?Home Medications ?Prior to Admission medications   ?Medication Sig Start Date End Date Taking? Authorizing Provider  ?cetirizine (ZYRTEC ALLERGY) 10 MG tablet Take 1 tablet (10 mg total) by mouth daily. 11/05/19   Durward ParcelAvegno, Komlanvi S, FNP  ?Ferrous Sulfate (IRON SLOW RELEASE) 142 (45 Fe) MG TBCR Take 45 mg by mouth every other day.    [provider]  ?levothyroxine (SYNTHROID, LEVOTHROID) 88 MCG tablet Take 88 mcg by mouth daily before breakfast.    [provider]  ?meclizine (ANTIVERT) 25 MG tablet Take 1 tablet (25 mg total) by mouth 3 (three) times daily as needed for dizziness. 01/23/21   Couture, Cortni S, PA-C  ?naproxen sodium (ALEVE) 220 MG tablet Take 440 mg by mouth daily as needed (pain).    [provider]  ?omeprazole (PRILOSEC) 40 MG capsule Take 40 mg by mouth daily.    [provider]  ?   ? ?Allergies    ?Patient has no known allergies.   ? ?Review of Systems    ?Review of Systems  ?Constitutional:  Negative for fever.  ?Gastrointestinal:  Positive for nausea. Negative for vomiting.  ?Neurological:  Positive for dizziness, light-headedness and headaches. Negative for tremors, seizures, syncope, facial asymmetry, speech difficulty and weakness.  ?All other systems reviewed and are negative. ? ?Physical Exam ?Updated Vital Signs ?BP (!) 187/91   Pulse (!) 56   Temp 97.6 ?F (36.4 ?C) (Oral)   Resp 15   Ht 5\' 2"  (1.575 m)   Wt 69.9 kg   LMP 04/14/2019   SpO2 100%   BMI 28.17 kg/m?  ?Physical Exam ?Vitals and nursing note reviewed.  ?Constitutional:   ?   General: She is not in acute distress. ?   Appearance: Normal appearance. She is normal weight. She is ill-appearing. She is not toxic-appearing.  ?HENT:  ?   Head: Normocephalic and atraumatic.  ?   Nose: Nose normal.  ?   Mouth/Throat:  ?   Mouth: Mucous membranes are moist.  ?   Pharynx: Oropharynx is clear.  ?Eyes:  ?   General: No visual field deficit or scleral icterus. ?   Extraocular Movements: Extraocular movements intact.  ?   Conjunctiva/sclera: Conjunctivae normal.  ?   Pupils: Pupils are equal, round, and reactive to light.  ?Cardiovascular:  ?   Rate and Rhythm: Regular rhythm.  Bradycardia present.  ?   Pulses: Normal pulses.  ?   Heart sounds: No murmur heard. ?Pulmonary:  ?   Effort: Pulmonary effort is normal. No respiratory distress.  ?   Breath sounds: Normal breath sounds.  ?Abdominal:  ?   General: Bowel sounds are normal.  ?   Palpations: Abdomen is soft.  ?   Tenderness: There is no abdominal tenderness.  ?Musculoskeletal:     ?   General: Normal range of motion.  ?   Cervical back: Neck supple. No rigidity or tenderness.  ?Skin: ?   General: Skin is warm and dry.  ?   Capillary Refill: Capillary refill takes less than 2 seconds.  ?Neurological:  ?   General: No focal deficit present.  ?   Mental Status: She is alert and oriented to person, place, and time.  ?   GCS: GCS eye subscore is 4. GCS  verbal subscore is 5. GCS motor subscore is 6.  ?   Cranial Nerves: Cranial nerves 2-12 are intact. No cranial nerve deficit, dysarthria or facial asymmetry.  ?   Sensory: Sensation is intact. No sensory deficit.  ?   Motor: Motor function is intact. No tremor or pronator drift.  ?   Coordination: Coordination is intact.  ?   Gait: Gait is intact.  ?   Comments: Globally weak ?Slowed gait ?Horizontal, fatigable nystagmus.  No vertical or bidirectional nystagmus.  ? ? ?ED Results / Procedures / Treatments   ?Labs ?(all labs ordered are listed, but only abnormal results are displayed) ?Labs Reviewed  ?BASIC METABOLIC PANEL - Abnormal; Notable for the following components:  ?    Result Value  ? Glucose, Bld 113 (*)   ? All other components within normal limits  ?URINALYSIS, ROUTINE W REFLEX MICROSCOPIC - Abnormal; Notable for the following components:  ? Color, Urine STRAW (*)   ? Specific Gravity, Urine 1.004 (*)   ? Hgb urine dipstick SMALL (*)   ? Bacteria, UA RARE (*)   ? All other components within normal limits  ?RESP PANEL BY RT-PCR (FLU A&B, COVID) [id number]  ?CBC  ?ETHANOL  ?PROTIME-INR  ?APTT  ?DIFFERENTIAL  ?RAPID URINE DRUG SCREEN, HOSP PERFORMED  ?I-STAT CHEM 8, ED  ? ?EKG ?None ? ?Radiology ?CT Angio Head W or Wo Contrast ? ?Result Date: 01/26/2022 ?CLINICAL DATA:  Dizziness and nausea EXAM: CT ANGIOGRAPHY HEAD AND NECK TECHNIQUE: Multidetector CT imaging of the head and neck was performed using the standard protocol during bolus administration of intravenous contrast. Multiplanar CT image reconstructions and MIPs were obtained to evaluate the vascular anatomy. Carotid stenosis measurements (when applicable) are obtained utilizing NASCET criteria, using the distal internal carotid diameter as the denominator. RADIATION DOSE REDUCTION: This exam was performed according to the departmental dose-optimization program which includes automated exposure control, adjustment of the mA and/or kV according to patient  size and/or use of iterative reconstruction technique. CONTRAST:  81mL OMNIPAQUE IOHEXOL 350 MG/ML SOLN COMPARISON:  01/23/2021 FINDINGS: CTA NECK FINDINGS SKELETON: There is no bony spinal canal stenosis. No lytic or blastic lesion. OTHER NECK: Normal pharynx, larynx and major salivary glands. No cervical lymphadenopathy. Unremarkable thyroid gland. UPPER CHEST: No pneumothorax or pleural effusion. No nodules or masses. AORTIC ARCH: There is no calcific atherosclerosis of the aortic arch. There is no aneurysm, dissection or hemodynamically significant stenosis of the visualized portion of the aorta. Conventional 3 vessel aortic branching pattern. The visualized proximal subclavian arteries are widely patent. RIGHT CAROTID SYSTEM:  Normal without aneurysm, dissection or stenosis. LEFT CAROTID SYSTEM: Normal without aneurysm, dissection or stenosis. VERTEBRAL ARTERIES: Codominant configuration. Long segment narrowing and multifocal complete loss of opacification of the left vertebral artery which is new since 01/25/2021 and likely indicates dissection. Right vertebral artery is normal. CTA HEAD FINDINGS POSTERIOR CIRCULATION: --Vertebral arteries: Limited opacification of the left V4 segment. Right is normal. --Inferior cerebellar arteries: Both PICA are opacified. --Basilar artery: Normal. --Superior cerebellar arteries: Normal. --Posterior cerebral arteries (PCA): Normal. ANTERIOR CIRCULATION: --Intracranial internal carotid arteries: Normal. --Anterior cerebral arteries (ACA): Normal. Both A1 segments are present. Patent anterior communicating artery (a-comm). --Middle cerebral arteries (MCA): Normal. VENOUS SINUSES: As permitted by contrast timing, patent. ANATOMIC VARIANTS: None Review of the MIP images confirms the above findings. IMPRESSION: 1. Long segment narrowing and multifocal complete loss of opacification of the left vertebral artery, new since 01/23/2021, consistent with dissection. Consultation for  possible intervention is recommended. 2. Intracranial and cervical arteries otherwise normal. Critical Value/emergent results were called by telephone at the time of interpretation on 01/26/2022 at 9:32 pm to provider LAU

## 2022-01-26 NOTE — ED Triage Notes (Signed)
Patient complaining of dizziness and nausea that started 20 min PTA.  ?

## 2022-01-27 ENCOUNTER — Inpatient Hospital Stay (HOSPITAL_COMMUNITY): Payer: 59

## 2022-01-27 DIAGNOSIS — I639 Cerebral infarction, unspecified: Secondary | ICD-10-CM | POA: Diagnosis present

## 2022-01-27 DIAGNOSIS — R110 Nausea: Secondary | ICD-10-CM

## 2022-01-27 DIAGNOSIS — E039 Hypothyroidism, unspecified: Secondary | ICD-10-CM | POA: Diagnosis present

## 2022-01-27 DIAGNOSIS — D509 Iron deficiency anemia, unspecified: Secondary | ICD-10-CM

## 2022-01-27 DIAGNOSIS — R42 Dizziness and giddiness: Secondary | ICD-10-CM

## 2022-01-27 DIAGNOSIS — I7774 Dissection of vertebral artery: Secondary | ICD-10-CM | POA: Diagnosis not present

## 2022-01-27 DIAGNOSIS — K219 Gastro-esophageal reflux disease without esophagitis: Secondary | ICD-10-CM | POA: Diagnosis present

## 2022-01-27 DIAGNOSIS — R519 Headache, unspecified: Secondary | ICD-10-CM | POA: Diagnosis present

## 2022-01-27 LAB — RAPID URINE DRUG SCREEN, HOSP PERFORMED
Amphetamines: NOT DETECTED
Barbiturates: NOT DETECTED
Benzodiazepines: NOT DETECTED
Cocaine: NOT DETECTED
Opiates: NOT DETECTED
Tetrahydrocannabinol: NOT DETECTED

## 2022-01-27 LAB — COMPREHENSIVE METABOLIC PANEL
ALT: 22 U/L (ref 0–44)
AST: 22 U/L (ref 15–41)
Albumin: 4.2 g/dL (ref 3.5–5.0)
Alkaline Phosphatase: 57 U/L (ref 38–126)
Anion gap: 9 (ref 5–15)
BUN: 13 mg/dL (ref 6–20)
CO2: 23 mmol/L (ref 22–32)
Calcium: 9.2 mg/dL (ref 8.9–10.3)
Chloride: 109 mmol/L (ref 98–111)
Creatinine, Ser: 0.6 mg/dL (ref 0.44–1.00)
GFR, Estimated: >60 mL/min (ref >60)
Glucose, Bld: 88 mg/dL (ref 70–99)
Potassium: 3.7 mmol/L (ref 3.5–5.1)
Sodium: 141 mmol/L (ref 135–145)
Total Bilirubin: 0.9 mg/dL (ref 0.3–1.2)
Total Protein: 7.1 g/dL (ref 6.5–8.1)

## 2022-01-27 LAB — HEPARIN LEVEL (UNFRACTIONATED)
Heparin Unfractionated: 0.25 IU/mL — ABNORMAL LOW (ref 0.30–0.70)
Heparin Unfractionated: 0.27 IU/mL — ABNORMAL LOW (ref 0.30–0.70)
Heparin Unfractionated: 0.46 IU/mL (ref 0.30–0.70)

## 2022-01-27 LAB — LIPID PANEL
Cholesterol: 155 mg/dL (ref 0–200)
HDL: 56 mg/dL (ref >40)
LDL Cholesterol: 88 mg/dL (ref 0–99)
Total CHOL/HDL Ratio: 2.8 RATIO
Triglycerides: 55 mg/dL (ref <150)
VLDL: 11 mg/dL (ref 0–40)

## 2022-01-27 LAB — HEMOGLOBIN A1C
Hgb A1c MFr Bld: 4.9 % (ref 4.8–5.6)
Mean Plasma Glucose: 93.93 mg/dL

## 2022-01-27 LAB — CBC
HCT: 40.2 % (ref 36.0–46.0)
Hemoglobin: 13.9 g/dL (ref 12.0–15.0)
MCH: 32 pg (ref 26.0–34.0)
MCHC: 34.6 g/dL (ref 30.0–36.0)
MCV: 92.4 fL (ref 80.0–100.0)
Platelets: 182 10*3/uL (ref 150–400)
RBC: 4.35 MIL/uL (ref 3.87–5.11)
RDW: 12.6 % (ref 11.5–15.5)
WBC: 4.7 10*3/uL (ref 4.0–10.5)
nRBC: 0 % (ref 0.0–0.2)

## 2022-01-27 LAB — PHOSPHORUS: Phosphorus: 3.1 mg/dL (ref 2.5–4.6)

## 2022-01-27 LAB — MAGNESIUM: Magnesium: 2.1 mg/dL (ref 1.7–2.4)

## 2022-01-27 MED ORDER — LEVOTHYROXINE SODIUM 88 MCG PO TABS
88.0000 ug | ORAL_TABLET | Freq: Every day | ORAL | Status: DC
  Administered 2022-01-27 – 2022-01-30 (×4): 88 ug via ORAL
  Filled 2022-01-27 (×4): qty 1

## 2022-01-27 MED ORDER — METHOCARBAMOL 500 MG PO TABS
500.0000 mg | ORAL_TABLET | Freq: Three times a day (TID) | ORAL | Status: DC
  Administered 2022-01-27 – 2022-01-28 (×4): 500 mg via ORAL
  Filled 2022-01-27 (×4): qty 1

## 2022-01-27 MED ORDER — FERROUS SULFATE 325 (65 FE) MG PO TABS
325.0000 mg | ORAL_TABLET | ORAL | Status: DC
Start: 2022-01-27 — End: 2022-01-30
  Administered 2022-01-27 – 2022-01-29 (×2): 325 mg via ORAL
  Filled 2022-01-27 (×3): qty 1

## 2022-01-27 MED ORDER — LORAZEPAM 2 MG/ML IJ SOLN
INTRAMUSCULAR | Status: AC
  Administered 2022-01-27: 1 mg via INTRAVENOUS
  Filled 2022-01-27: qty 1

## 2022-01-27 MED ORDER — ROSUVASTATIN CALCIUM 20 MG PO TABS
20.0000 mg | ORAL_TABLET | Freq: Every day | ORAL | Status: DC
  Administered 2022-01-27 – 2022-01-30 (×4): 20 mg via ORAL
  Filled 2022-01-27 (×5): qty 1

## 2022-01-27 MED ORDER — ONDANSETRON HCL 4 MG/2ML IJ SOLN: 4.0000 mg | Freq: Four times a day (QID) | INTRAMUSCULAR | Status: DC | PRN

## 2022-01-27 MED ORDER — LORAZEPAM 2 MG/ML IJ SOLN: 1.0000 mg | Freq: Once | INTRAMUSCULAR | Status: AC

## 2022-01-27 MED ORDER — MECLIZINE HCL 25 MG PO TABS
25.0000 mg | ORAL_TABLET | Freq: Three times a day (TID) | ORAL | Status: DC | PRN
  Filled 2022-01-27: qty 1

## 2022-01-27 MED ORDER — PANTOPRAZOLE SODIUM 40 MG PO TBEC
40.0000 mg | DELAYED_RELEASE_TABLET | Freq: Every day | ORAL | Status: DC
Start: 2022-01-27 — End: 2022-01-30
  Administered 2022-01-27 – 2022-01-30 (×4): 40 mg via ORAL
  Filled 2022-01-27 (×4): qty 1

## 2022-01-27 NOTE — Progress Notes (Signed)
?  Progress Note ?Patient: Kristy Abbott DOB: Mar 30, 1966 DOA: 01/26/2022  ?DOS: the patient was seen and examined on 01/27/2022 ? ?Brief hospital course: ? IVER FEHRENBACH is a 56 y.o. female past history of anemia, Graves' disease, presented to the emergency room at [facility] complaining of dizziness that started when she was getting her dog and went to put the dog down on the floor. ? ?Assessment and Plan: ?Left vertebral artery dissection ?CT angiogram of head and neck showed left vertebral artery dissection ?Patient will be admitted to progressive unit  ?MRI of brain without contrast in the morning ?Continue IV heparin drip per neurology recommendation ?Continue fall precautions and neuro checks ?Continue PT/ OT eval and treat ?Appreciate urology assistance. ?Patient will require angiography.  Neuro interventional radiology consulted. ?  ?Headache ?Continue Tylenol and Robaxin. ? ?Nausea ?Continue IV Zofran as needed ? ?Hypothyroidism ?Continue Synthroid ? ?GERD ?Continue Protonix ? ?Iron deficiency anemia ?Continue ferrous sulfate ? ?Subjective: No nausea no vomiting no fever no chills.  Continues to have headache as well as left nap of the neck pain.  Tingling and numbness in the left side. ? ?Physical Exam: ?Vitals:  ? 01/27/22 0730 01/27/22 1121 01/27/22 1619 01/27/22 1946  ?BP: 118/67 116/78 122/85 96/64  ?Pulse: 62 79  (!) 47  ?Resp: 17 17 16 18   ?Temp: 97.8 ?F (36.6 ?C) 97.7 ?F (36.5 ?C) 97.6 ?F (36.4 ?C) 97.9 ?F (36.6 ?C)  ?TempSrc: Oral Oral Oral Oral  ?SpO2: 96% 96% 98% 97%  ?Weight:      ?Height:      ? ?General: Appear in mild distress; no visible Abnormal Neck Mass Or lumps, Conjunctiva normal ?Cardiovascular: S1 and S2 Present, no Murmur, ?Respiratory: good respiratory effort, Bilateral Air entry present and CTA, no Crackles, no wheezes ?Abdomen: Bowel Sound present, Non tender ?Extremities: no Pedal edema ?Neurology: alert and oriented to time, place, and person, left-sided  weakness and numbness in both upper and lower extremity ?Gait not checked due to patient safety concerns  ? ?Data Reviewed: ?I have Reviewed nursing notes, Vitals, and Lab results since pt's last encounter. Pertinent lab results CBC and CMP ?I have ordered test including CBC and BMP ?I have discussed pt's care plan and test results with neurology.  ? ?Family Communication: Husband at bedside ? ?Disposition: ?Status is: Inpatient ?Remains inpatient appropriate because: Requires further work-up, angiographic. ? ?Author: ? , MD ?01/27/2022 8:58 PM ? ?For on call review [email or web address]. ?

## 2022-01-27 NOTE — Consult Note (Signed)
Neurology Consultation ? ?Reason for Consult: dizziness, neck pain ?Referring Physician: L. Erskine Speed, PA, [facility] ?CC: Dizziness, neck pain ? ?History is obtained from: patient and chart ? ?HPI: Kristy Abbott is a 56 y.o. female past history of anemia, Graves' disease, presented to the emergency room at [facility] complaining of dizziness that started when she was getting her dog and went to put the dog down on the floor.  She said she bent over and became very dizzy.  Does not remember straining or cracking her neck.  She was so dizzy-with room spinning and lightheadedness that she had to catch herself on the windowsill.  Her symptoms have not improved.  Also had some nausea which was somewhat relieved with Zofran, but continues to feel dizzy which she describes as lightheadedness as well as spinning.  Also endorses some blurred vision and pain around the neck and the back more so on the left.  Also describes an episode similarly of dizziness 6 months ago but that was very transient. ?Does not report any chest pain shortness of breath. ?Reports some weakness and numbness on the left hemibody. ? ?LKW: 1:30 PM 01/26/2022.  Evaluated by neurology around 1:45 AM 01/27/2022 upon arrival to [facility]  Initial contact to neurology by the PA at [facility], ER at 2200 hrs. on 01/26/2022. ?tpa given?: no, at the time of neurology evaluation, outside the window ?Premorbid modified Rankin scale (mRS): 0 ? ? ?ROS: Full ROS was performed and is negative except as noted in the HPI.  ? ?Past Medical History:  ?Diagnosis Date  ? Anemia   ? Graves disease   ? Hypothyroidism   ? ?Family History  ?Problem Relation Age of Onset  ? CAD Mother   ? Diabetes Mother   ? Colon cancer Neg Hx   ? Colon polyps Neg Hx   ? ?Social History:  ? reports that she quit smoking about 23 years ago. Her smoking use included cigarettes. She started smoking about 40 years ago. She smoked an average of 1 pack per day. She has never used  smokeless tobacco. She reports that she does not drink alcohol and does not use drugs. ? ?Medications ? ?Current Facility-Administered Medications:  ?  acetaminophen (TYLENOL) tablet 650 mg, 650 mg, Oral, Q6H PRN **OR** acetaminophen (TYLENOL) suppository 650 mg, 650 mg, Rectal, Q6H PRN, Adefeso, Oladapo, DO ?  heparin ADULT infusion 100 units/mL (25000 units/274mL), 850 Units/hr, Intravenous, Continuous, Adefeso, Oladapo, DO, Last Rate: 8.5 mL/hr at 01/26/22 2230, 850 Units/hr at 01/26/22 2230 ?  ondansetron (ZOFRAN) injection 4 mg, 4 mg, Intravenous, Q6H PRN, Adefeso, Oladapo, DO ? ?Exam: ?Current vital signs: ?BP 139/90   Pulse (!) 53   Temp 97.7 ?F (36.5 ?C) (Oral)   Resp 12   Ht 5\' 2"  (1.575 m)   Wt 69.9 kg   LMP 04/14/2019   SpO2 99%   BMI 28.17 kg/m?  ?Vital signs in last 24 hours: ?Temp:  [97.6 ?F (36.4 ?C)-97.7 ?F (36.5 ?C)] 97.7 ?F (36.5 ?C) (04/10 0030) ?Pulse Rate:  [47-74] 53 (04/10 0030) ?Resp:  [12-23] 12 (04/10 0030) ?BP: (121-187)/(77-97) 139/90 (04/10 0030) ?SpO2:  [96 %-100 %] 99 % (04/10 0030) ?Weight:  [69.9 kg] 69.9 kg (04/09 1553) ?General: Awake alert in no distress ?HEENT: Normocephalic, atraumatic, multiple missing teeth. ?Lungs: Clear ?Cardiovascular: Regular rate rhythm ?Abdomen nondistended nontender ?Extremities warm well perfused ?Neurological exam ?Awake alert oriented x3 ?No dysarthria ?No aphasia ?Cranial nerves II through XII: Pupils equal  round react light, extra movements intact with couple beats of end gaze nystagmus bilaterally which is nonsustained, visual fields full, facial sensation diminished on the left, face symmetric, tongue and palate midline. ?Motor examination with no vertical drift but there is subtle left-sided weakness in the upper and lower extremity which is 4+/5 strength compared to the right upper and lower extremity which are 5/5 strength. ?Sensory exam with diminished light touch and temperature on the left ?Coordination with mild dysmetria on the  left ?NIH stroke scale ?1a Level of Conscious.: 0 ?1b LOC Questions: 0 ?1c LOC Commands: 0 ?2 Best Gaze: 0 ?3 Visual: 0 ?4 Facial Palsy: 0 ?5a Motor Arm - left: 0 ?5b Motor Arm - Right: 0 ?6a Motor Leg - Left: 0 ?6b Motor Leg - Right: 0 ?7 Limb Ataxia: 2 ?8 Sensory: 1 ?9 Best Language: 0 ?10 Dysarthria: 0 ?11 Extinct. and Inatten.: 0 ?TOTAL: 3 ? ?Labs ?I have reviewed labs in epic and the results pertinent to this consultation are: ? ?CBC ?   ?Component Value Date/Time  ? WBC 5.9 01/26/2022 1659  ? RBC 4.59 01/26/2022 1659  ? HGB 13.9 01/26/2022 1659  ? HCT 41.8 01/26/2022 1659  ? PLT 224 01/26/2022 1659  ? MCV 91.1 01/26/2022 1659  ? MCH 30.3 01/26/2022 1659  ? MCHC 33.3 01/26/2022 1659  ? RDW 12.3 01/26/2022 1659  ? LYMPHSABS 1.7 01/26/2022 2305  ? MONOABS 0.3 01/26/2022 2305  ? EOSABS 0.1 01/26/2022 2305  ? BASOSABS 0.0 01/26/2022 2305  ? ? ?CMP  ?   ?Component Value Date/Time  ? NA 139 01/26/2022 1659  ? K 3.8 01/26/2022 1659  ? CL 107 01/26/2022 1659  ? CO2 24 01/26/2022 1659  ? GLUCOSE 113 (H) 01/26/2022 1659  ? BUN 16 01/26/2022 1659  ? CREATININE 0.67 01/26/2022 1659  ? CALCIUM 9.7 01/26/2022 1659  ? PROT 8.0 01/23/2021 1358  ? ALBUMIN 4.7 01/23/2021 1358  ? AST 22 01/23/2021 1358  ? ALT 16 01/23/2021 1358  ? ALKPHOS 45 01/23/2021 1358  ? BILITOT 0.9 01/23/2021 1358  ? GFRNONAA >60 01/26/2022 1659  ? GFRAA >90 01/19/2014 1921  ? ?Lipid Panel  ?   ?Component Value Date/Time  ? CHOL 234 (H) 01/23/2021 1358  ? TRIG 105 01/23/2021 1358  ? HDL 60 01/23/2021 1358  ? CHOLHDL 3.9 01/23/2021 1358  ? VLDL 21 01/23/2021 1358  ? LDLCALC 153 (H) 01/23/2021 1358  ? ?Imaging ?I have reviewed the images obtained: ? ?CT angiography head and neck: Compared to April 2022, no new infarct.  Vessel imaging reveals long segment narrowing and multifocal complete loss of opacification of the left vertebral artery mostly in the neck and partly intradurally as well.  This was widely patent in 2022 ?Basilar artery is  patent ? ?Assessment:  ?56 year old with sudden onset of dizziness and left-sided neck pain along with on examination subtle left-sided incoordination and left neck pain-query left cerebellum/brainstem stroke ?CT angiography consistent with left vertebral artery dissection which is new from 2022. ?Started on heparin drip at the outside hospital after phone consultation with me. ? ?Impression: ?Left vertebral artery occlusion-likely dissection ?Evaluate for posterior circulation stroke especially left cerebellum/brainstem ? ?Recommendations: ?Continue heparin drip stroke protocol ?Telemetry ?Frequent neurochecks ?MRI brain without contrast ?2D echo ?A1c ?Lipid panel ?Since she is on anticoagulation, blood pressure goal systolic less than [id number]. ?PT ?OT ?Speech therapy ?N.p.o. until cleared by bedside stroke swallow screen. ?Discussed with Dr. Silva Bandy Rodriguez-no emergent need for intervention at this  time. ?Long-term preventive treatment-dual antiplatelet versus anticoagulation-decision after work-up completion. ?Stroke team will follow. ? ?-- ?Amie Portland, MD ?Neurologist ?[facility] ?Pager: [phone] ? ?

## 2022-01-27 NOTE — Plan of Care (Signed)
?  Problem: Education: ?Goal: Knowledge of General Education information will improve ?Description: Including pain rating scale, medication(s)/side effects and non-pharmacologic comfort measures ?Outcome: Progressing ?  ?Problem: Health Behavior/Discharge Planning: ?Goal: Ability to manage health-related needs will improve ?Outcome: Progressing ?  ?Problem: Clinical Measurements: ?Goal: Ability to maintain clinical measurements within normal limits will improve ?Outcome: Progressing ?Goal: Will remain free from infection ?Outcome: Progressing ?Goal: Diagnostic test results will improve ?Outcome: Progressing ?Goal: Respiratory complications will improve ?Outcome: Progressing ?Goal: Cardiovascular complication will be avoided ?Outcome: Progressing ?  ?Problem: Activity: ?Goal: Risk for activity intolerance will decrease ?Outcome: Progressing ?  ?Problem: Nutrition: ?Goal: Adequate nutrition will be maintained ?Outcome: Progressing ?  ?Problem: Pain Managment: ?Goal: General experience of comfort will improve ?Outcome: Progressing ?  ?Problem: Safety: ?Goal: Ability to remain free from injury will improve ?Outcome: Progressing ?  ?Problem: Health Behavior/Discharge Planning: ?Goal: Ability to manage health-related needs will improve ?Outcome: Progressing ?  ?Problem: Nutrition: ?Goal: Risk of aspiration will decrease ?Outcome: Progressing ?Goal: Dietary intake will improve ?Outcome: Progressing ?  ?

## 2022-01-27 NOTE — Evaluation (Signed)
Physical Therapy Evaluation ?Patient Details ?Name: Kristy Abbott ?MRN: [id number] ?DOB: March 24, 1966 ?Today's Date: 01/27/2022 ? ?History of Present Illness ? Pt is a 56 y.o. female admitted 4/9 with c/o dizziness, N&V. 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 PMH:  hypothyroidism, GERD, iron deficiency anemia ?  ?Clinical Impression ? Pt admitted with above diagnosis. PTA pt lived at home with husband; independent, driving, and working. Pt currently with functional limitations due to the deficits listed below (see PT Problem List). On eval, pt required min assist bed mobility, min assist transfers, and min/HHA ambulation 15' x 2. Mobility limited by dizziness and nausea. Increased time required to complete all mobility skills. She presents with shuffle gait pattern and slow cadence. Decreased strength and sensation noted LUE/LE. Pt will benefit from skilled PT to increase their independence and safety with mobility to allow discharge to the venue listed below.   ?   ?   ? ?Recommendations for follow up therapy are one component of a multi-disciplinary discharge planning process, led by the attending physician.  Recommendations may be updated based on patient status, additional functional criteria and insurance authorization. ? ?Follow Up Recommendations Acute inpatient rehab (3hours/day) ? ?  ?Assistance Recommended at Discharge PRN  ?Patient can return home with the following ? A little help with walking and/or transfers;Assistance with cooking/housework;Assist for transportation;A little help with bathing/dressing/bathroom;Help with stairs or ramp for entrance ? ?  ?Equipment Recommendations Other (comment) (TBD)  ?Recommendations for Other Services ? Rehab consult  ?  ?Functional Status Assessment Patient has had a recent decline in their functional status and demonstrates the ability to make significant improvements in function in a reasonable and predictable  amount of time.  ? ?  ?Precautions / Restrictions Precautions ?Precautions: Fall  ? ?  ? ?Mobility ? Bed Mobility ?Overal bed mobility: Needs Assistance ?Bed Mobility: Supine to Sit, Sit to Supine ?  ?  ?Supine to sit: Min assist ?Sit to supine: Min assist, HOB elevated ?  ?General bed mobility comments: increased time, assist to elevate trunk, assist with BLE back to bed ?  ? ?Transfers ?Overall transfer level: Needs assistance ?Equipment used: 1 person hand held assist ?Transfers: Sit to/from Stand ?Sit to Stand: Min assist ?  ?  ?  ?  ?  ?General transfer comment: increased time, assist to power up and stabilize balance ?  ? ?Ambulation/Gait ?Ambulation/Gait assistance: Min assist ?Gait Distance (Feet): 15 Feet (x 2 (to/from bathroom)) ?Assistive device: IV Pole, 1 person hand held assist ?Gait Pattern/deviations: Step-through pattern, Decreased stride length, Shuffle ?Gait velocity: very slow cadence due to dizziness ?Gait velocity interpretation: <1.8 ft/sec, indicate of risk for recurrent falls ?  ?General Gait Details: short, shuffle steps; hand-held assist L, pushing IV pole with R ? ?Stairs ?  ?  ?  ?  ?  ? ?Wheelchair Mobility ?  ? ?Modified Rankin (Stroke Patients Only) ?Modified Rankin (Stroke Patients Only) ?Pre-Morbid Rankin Score: No symptoms ?Modified Rankin: Moderately severe disability ? ?  ? ?Balance Overall balance assessment: Needs assistance ?Sitting-balance support: Feet supported, No upper extremity supported ?Sitting balance-Leahy Scale: Fair ?  ?  ?Standing balance support: During functional activity, Single extremity supported, Bilateral upper extremity supported ?Standing balance-Leahy Scale: Poor ?Standing balance comment: reliant on external assist ?  ?  ?  ?  ?  ?  ?  ?  ?  ?  ?  ?   ? ? ? ?Pertinent Vitals/Pain Pain  Assessment ?Pain Assessment: Faces ?Faces Pain Scale: Hurts little more ?Pain Location: L neck/shoulder ?Pain Descriptors / Indicators: Grimacing, Discomfort, Sore ?Pain  Intervention(s): Monitored during session, Limited activity within patient's tolerance  ? ? ?Home Living Family/patient expects to be discharged to:: Private residence ?Living Arrangements: Spouse/significant other ?Available Help at Discharge: Family;Available 24 hours/day ?Type of Home: House ?Home Access: Stairs to enter ?Entrance Stairs-Rails: Right ?Entrance Stairs-Number of Steps: 4 ?  ?Home Layout: One level ?Home Equipment: None ?   ?  ?Prior Function Prior Level of Function : Independent/Modified Independent;Working/employed;Driving ?  ?  ?  ?  ?  ?  ?  ?  ?  ? ? ?Hand Dominance  ?   ? ?  ?Extremity/Trunk Assessment  ? Upper Extremity Assessment ?Upper Extremity Assessment: Defer to OT evaluation ?  ? ?Lower Extremity Assessment ?Lower Extremity Assessment: LLE deficits/detail ?LLE Deficits / Details: 4/5 grossly graded ?LLE Sensation: decreased light touch;decreased proprioception ?LLE Coordination: decreased gross motor;decreased fine motor ?  ? ?Cervical / Trunk Assessment ?Cervical / Trunk Assessment: Normal  ?Communication  ? Communication: No difficulties  ?Cognition Arousal/Alertness: Awake/alert ?Behavior During Therapy: [facility] for tasks assessed/performed ?Overall Cognitive Status: Within Functional Limits for tasks assessed ?  ?  ?  ?  ?  ?  ?  ?  ?  ?  ?  ?  ?  ?  ?  ?  ?  ?  ?  ? ?  ?General Comments General comments (skin integrity, edema, etc.): Pt keeping eyes closed throughout session. Able to open with cues. ? ?  ?Exercises    ? ?Assessment/Plan  ?  ?PT Assessment Patient needs continued PT services  ?PT Problem List Decreased strength;Decreased mobility;Decreased coordination;Decreased activity tolerance;Pain;Decreased balance;Decreased knowledge of use of DME;Impaired sensation ? ?   ?  ?PT Treatment Interventions DME instruction;Therapeutic activities;Gait training;Therapeutic exercise;Patient/family education;Balance training;Stair training;Functional mobility training   ? ?PT Goals  (Current goals can be found in the Care Plan section)  ?Acute Rehab PT Goals ?Patient Stated Goal: stop the dizziness ?PT Goal Formulation: With patient ?Time For Goal Achievement: 02/10/22 ?Potential to Achieve Goals: Good ? ?  ?Frequency Min 4X/week ?  ? ? ?Co-evaluation   ?  ?  ?  ?  ? ? ?  ?AM-PAC PT "6 Clicks" Mobility  ?Outcome Measure Help needed turning from your back to your side while in a flat bed without using bedrails?: A Little ?Help needed moving from lying on your back to sitting on the side of a flat bed without using bedrails?: A Little ?Help needed moving to and from a bed to a chair (including a wheelchair)?: A Little ?Help needed standing up from a chair using your arms (e.g., wheelchair or bedside chair)?: A Little ?Help needed to walk in hospital room?: A Little ?Help needed climbing 3-5 steps with a railing? : A Lot ?6 Click Score: 17 ? ?  ?End of Session Equipment Utilized During Treatment: Gait belt ?Activity Tolerance: Treatment limited secondary to medical complications (Comment) (dizziness, nausea) ?Patient left: in bed;with call bell/phone within reach ?Nurse Communication: Mobility status ?PT Visit Diagnosis: Unsteadiness on feet (R26.81);Difficulty in walking, not elsewhere classified (R26.2);Dizziness and giddiness (R42);Muscle weakness (generalized) (M62.81) ?  ? ?Time: 3016-0109 ?PT Time Calculation (min) (ACUTE ONLY): 25 min ? ? ?Charges:   PT Evaluation ?$PT Eval Moderate Complexity: 1 Mod ?PT Treatments ?$Gait Training: 8-22 mins ?  ?   ? ? ?Kristy Abbott, PT  ?Office # [phone] ?Pager [phone] ? ? ?  Kristy Abbott, Kristy Abbott ?01/27/2022, 9:14 AM ? ?

## 2022-01-27 NOTE — Progress Notes (Signed)
ANTICOAGULATION CONSULT NOTE  ? ?Pharmacy Consult for Heparin  ?Indication:  Left vertebral artery dissection ? ?No Known Allergies ? ?Patient Measurements: ?Height: 5\' 2"  (157.5 cm) ?Weight: 69.8 kg (153 lb 14.1 oz) ?IBW/kg (Calculated) : 50.1 ?Vital Signs: ?Temp: 97.9 ?F (36.6 ?C) (04/10 1946) ?Temp Source: Oral (04/10 1946) ?BP: 96/64 (04/10 1946) ?Pulse Rate: 47 (04/10 1946) ? ?Labs: ?Recent Labs  ?  01/26/22 ?1659 01/26/22 ?2305 01/27/22 ?0614 01/27/22 ?1414 01/27/22 ?2118  ?HGB 13.9  --  13.9  --   --   ?HCT 41.8  --  40.2  --   --   ?PLT 224  --  182  --   --   ?APTT  --  31  --   --   --   ?LABPROT  --  13.2  --   --   --   ?INR  --  1.0  --   --   --   ?HEPARINUNFRC  --   --  0.25* 0.27* 0.46  ?CREATININE 0.67  --  0.60  --   --   ? ? ? ?Estimated Creatinine Clearance: 74.5 mL/min (by C-G formula based on SCr of 0.6 mg/dL). ? ? ?Medical History: ?Past Medical History:  ?Diagnosis Date  ? Anemia   ? Graves disease   ? Hypothyroidism   ? ? ? ?Assessment: ?56 y/o F with dizziness, CT angio consistent with left vertebral artery dissection, starting stroke protocol heparin, CBC/renal function good, PTA meds reviewed.  ? ?Heparin level of 0.46 is therapeutic on heparin 1000 units/hr.  ? ?Goal of Therapy:  ?Heparin level 0.3-0.5 units/ml ?Monitor platelets by anticoagulation protocol: Yes ?  ?Plan:  ?Continue heparin 1000 units/hr  ?Follow up AM heparin level  ?Monitor heparin level, CBC and s/s of bleeding daily  ? ?Cristela Felt, PharmD, BCPS ?Clinical Pharmacist ?01/27/2022 9:52 PM ? ? ? ? ?

## 2022-01-27 NOTE — Progress Notes (Addendum)
STROKE TEAM PROGRESS NOTE  ? ?INTERVAL HISTORY ?Patient seen in room with no family.  She states that she noted dizziness that started when she was getting her dog and went to put the dog down on the floor.  She said she bent over and became very dizzy. She was so dizzy-with room spinning and lightheadedness that she had to catch herself on the windowsill.  She denies neck pain, trauma, car accident, chiropractor visit, frequent neck popping, or muscle strain.  She endorses diplopia with the images side-by-side with left gaze.  She is still reporting dizziness when her eyes are open.  Subjective left-sided weakness. 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  CT angiogram shows left vertebral artery long segment narrowing with occlusion in the neck.  Possibly from dissection. ?Vitals:  ? 01/27/22 0137 01/27/22 0140 01/27/22 0644 01/27/22 0730  ?BP:  132/78  118/67  ?Pulse:  (!) 57  62  ?Resp:  (!) 21 16 17   ?Temp:  97.9 ?F (36.6 ?C)  97.8 ?F (36.6 ?C)  ?TempSrc:  Oral  Oral  ?SpO2:    96%  ?Weight: 69.8 kg     ?Height: 5\' 2"  (1.575 m)     ? ?CBC:  ?Recent Labs  ?Lab 01/26/22 ?1659 01/26/22 ?2305 01/27/22 ?03/28/22  ?WBC 5.9  --  4.7  ?NEUTROABS  --  2.4  --   ?HGB 13.9  --  13.9  ?HCT 41.8  --  40.2  ?MCV 91.1  --  92.4  ?PLT 224  --  182  ? ?Basic Metabolic Panel:  ?Recent Labs  ?Lab 01/26/22 ?1659  ?NA 139  ?K 3.8  ?CL 107  ?CO2 24  ?GLUCOSE 113*  ?BUN 16  ?CREATININE 0.67  ?CALCIUM 9.7  ? ?Lipid Panel:  ?Recent Labs  ?Lab 01/27/22 ?03/28/22  ?CHOL 155  ?TRIG 55  ?HDL 56  ?CHOLHDL 2.8  ?VLDL 11  ?LDLCALC 88  ? ?HgbA1c: No results for input(s): HGBA1C in the last 168 hours. ?Urine Drug Screen:  ?Recent Labs  ?Lab 01/26/22 ?1559  ?LABOPIA NONE DETECTED  ?COCAINSCRNUR NONE DETECTED  ?LABBENZ NONE DETECTED  ?AMPHETMU NONE DETECTED  ?THCU NONE DETECTED  ?LABBARB NONE DETECTED  ?  ?Alcohol Level  ?Recent Labs  ?Lab 01/26/22 ?2304  ?ETH <10  ? ? ?IMAGING past 24 hours ?CT Angio Head W or Wo Contrast ? ?Result Date:  01/26/2022 ?CLINICAL DATA:  Dizziness and nausea EXAM: CT ANGIOGRAPHY HEAD AND NECK TECHNIQUE: Multidetector CT imaging of the head and neck was performed using the standard protocol during bolus administration of intravenous contrast. Multiplanar CT image reconstructions and MIPs were obtained to evaluate the vascular anatomy. Carotid stenosis measurements (when applicable) are obtained utilizing NASCET criteria, using the distal internal carotid diameter as the denominator. RADIATION DOSE REDUCTION: This exam was performed according to the departmental dose-optimization program which includes automated exposure control, adjustment of the mA and/or kV according to patient size and/or use of iterative reconstruction technique. CONTRAST:  22mL OMNIPAQUE IOHEXOL 350 MG/ML SOLN COMPARISON:  01/23/2021 FINDINGS: CTA NECK FINDINGS SKELETON: There is no bony spinal canal stenosis. No lytic or blastic lesion. OTHER NECK: Normal pharynx, larynx and major salivary glands. No cervical lymphadenopathy. Unremarkable thyroid gland. UPPER CHEST: No pneumothorax or pleural effusion. No nodules or masses. AORTIC ARCH: There is no calcific atherosclerosis of the aortic arch. There is no aneurysm, dissection or hemodynamically significant stenosis of the visualized portion of the aorta. Conventional 3 vessel aortic branching pattern. The visualized  proximal subclavian arteries are widely patent. RIGHT CAROTID SYSTEM: Normal without aneurysm, dissection or stenosis. LEFT CAROTID SYSTEM: Normal without aneurysm, dissection or stenosis. VERTEBRAL ARTERIES: Codominant configuration. Long segment narrowing and multifocal complete loss of opacification of the left vertebral artery which is new since 01/25/2021 and likely indicates dissection. Right vertebral artery is normal. CTA HEAD FINDINGS POSTERIOR CIRCULATION: --Vertebral arteries: Limited opacification of the left V4 segment. Right is normal. --Inferior cerebellar arteries: Both PICA  are opacified. --Basilar artery: Normal. --Superior cerebellar arteries: Normal. --Posterior cerebral arteries (PCA): Normal. ANTERIOR CIRCULATION: --Intracranial internal carotid arteries: Normal. --Anterior cerebral arteries (ACA): Normal. Both A1 segments are present. Patent anterior communicating artery (a-comm). --Middle cerebral arteries (MCA): Normal. VENOUS SINUSES: As permitted by contrast timing, patent. ANATOMIC VARIANTS: None Review of the MIP images confirms the above findings. IMPRESSION: 1. Long segment narrowing and multifocal complete loss of opacification of the left vertebral artery, new since 01/23/2021, consistent with dissection. Consultation for possible intervention is recommended. 2. Intracranial and cervical arteries otherwise normal. Critical Value/emergent results were called by telephone at the time of interpretation on 01/26/2022 at 9:32 pm to provider Mertha BaarsLAUREN AUTRY , who verbally acknowledged these results. Electronically Signed   By: Deatra RobinsonKevin  Herman M.D.   On: 01/26/2022 21:33  ? ?CT ANGIO NECK W OR WO CONTRAST ? ?Result Date: 01/26/2022 ?CLINICAL DATA:  Dizziness and nausea EXAM: CT ANGIOGRAPHY HEAD AND NECK TECHNIQUE: Multidetector CT imaging of the head and neck was performed using the standard protocol during bolus administration of intravenous contrast. Multiplanar CT image reconstructions and MIPs were obtained to evaluate the vascular anatomy. Carotid stenosis measurements (when applicable) are obtained utilizing NASCET criteria, using the distal internal carotid diameter as the denominator. RADIATION DOSE REDUCTION: This exam was performed according to the departmental dose-optimization program which includes automated exposure control, adjustment of the mA and/or kV according to patient size and/or use of iterative reconstruction technique. CONTRAST:  75mL OMNIPAQUE IOHEXOL 350 MG/ML SOLN COMPARISON:  01/23/2021 FINDINGS: CTA NECK FINDINGS SKELETON: There is no bony spinal canal  stenosis. No lytic or blastic lesion. OTHER NECK: Normal pharynx, larynx and major salivary glands. No cervical lymphadenopathy. Unremarkable thyroid gland. UPPER CHEST: No pneumothorax or pleural effusion. No nodules or masses. AORTIC ARCH: There is no calcific atherosclerosis of the aortic arch. There is no aneurysm, dissection or hemodynamically significant stenosis of the visualized portion of the aorta. Conventional 3 vessel aortic branching pattern. The visualized proximal subclavian arteries are widely patent. RIGHT CAROTID SYSTEM: Normal without aneurysm, dissection or stenosis. LEFT CAROTID SYSTEM: Normal without aneurysm, dissection or stenosis. VERTEBRAL ARTERIES: Codominant configuration. Long segment narrowing and multifocal complete loss of opacification of the left vertebral artery which is new since 01/25/2021 and likely indicates dissection. Right vertebral artery is normal. CTA HEAD FINDINGS POSTERIOR CIRCULATION: --Vertebral arteries: Limited opacification of the left V4 segment. Right is normal. --Inferior cerebellar arteries: Both PICA are opacified. --Basilar artery: Normal. --Superior cerebellar arteries: Normal. --Posterior cerebral arteries (PCA): Normal. ANTERIOR CIRCULATION: --Intracranial internal carotid arteries: Normal. --Anterior cerebral arteries (ACA): Normal. Both A1 segments are present. Patent anterior communicating artery (a-comm). --Middle cerebral arteries (MCA): Normal. VENOUS SINUSES: As permitted by contrast timing, patent. ANATOMIC VARIANTS: None Review of the MIP images confirms the above findings. IMPRESSION: 1. Long segment narrowing and multifocal complete loss of opacification of the left vertebral artery, new since 01/23/2021, consistent with dissection. Consultation for possible intervention is recommended. 2. Intracranial and cervical arteries otherwise normal. Critical Value/emergent results were called by telephone at  the time of interpretation on 01/26/2022 at  9:32 pm to provider Mertha Baars , who verbally acknowledged these results. Electronically Signed   By: Deatra Robinson M.D.   On: 01/26/2022 21:33  ? ?MR BRAIN WO CONTRAST ? ?Result Date: 01/27/2022 ?CLINICAL D

## 2022-01-27 NOTE — Progress Notes (Signed)
ANTICOAGULATION CONSULT NOTE  ? ?Pharmacy Consult for Heparin  ?Indication:  Left vertebral artery dissection ? ?No Known Allergies ? ?Patient Measurements: ?Height: 5\' 2"  (157.5 cm) ?Weight: 69.8 kg (153 lb 14.1 oz) ?IBW/kg (Calculated) : 50.1 ?Vital Signs: ?Temp: 97.7 ?F (36.5 ?C) (04/10 1121) ?Temp Source: Oral (04/10 1121) ?BP: 116/78 (04/10 1121) ?Pulse Rate: 79 (04/10 1121) ? ?Labs: ?Recent Labs  ?  01/26/22 ?1659 01/26/22 ?2305 01/27/22 ?0614 01/27/22 ?1414  ?HGB 13.9  --  13.9  --   ?HCT 41.8  --  40.2  --   ?PLT 224  --  182  --   ?APTT  --  31  --   --   ?LABPROT  --  13.2  --   --   ?INR  --  1.0  --   --   ?HEPARINUNFRC  --   --  0.25* 0.27*  ?CREATININE 0.67  --  0.60  --   ? ? ? ?Estimated Creatinine Clearance: 74.5 mL/min (by C-G formula based on SCr of 0.6 mg/dL). ? ? ?Medical History: ?Past Medical History:  ?Diagnosis Date  ? Anemia   ? Graves disease   ? Hypothyroidism   ? ? ? ?Assessment: ?56 y/o F with dizziness, CT angio consistent with left vertebral artery dissection, starting stroke protocol heparin, CBC/renal function good, PTA meds reviewed.  ? ?4/10 afternoon update:  ?Heparin level just below goal ? ?Goal of Therapy:  ?Heparin level 0.3-0.5 units/ml ?Monitor platelets by anticoagulation protocol: Yes ?  ?Plan:  ?No boluses ?Inc heparin drip to 1000 units/hr ?2130 heparin level ?Daily CBC and heparin level ?Monitor for bleeding ? ?Alanda Slim, PharmD, FCCM ?Clinical Pharmacist ?Please see AMION for all Pharmacists' Contact Phone Numbers ?01/27/2022, 2:46 PM   ? ? ? ?

## 2022-01-27 NOTE — Progress Notes (Signed)
ANTICOAGULATION CONSULT NOTE - Initial Consult ? ?Pharmacy Consult for Heparin  ?Indication:  Left vertebral artery dissection ? ?No Known Allergies ? ?Patient Measurements: ?Height: 5\' 2"  (157.5 cm) ?Weight: 69.9 kg (154 lb) ?IBW/kg (Calculated) : 50.1 ?Vital Signs: ?Temp: 97.6 ?F (36.4 ?C) (04/09 1552) ?Temp Source: Oral (04/09 1552) ?BP: 127/84 (04/09 2345) ?Pulse Rate: 48 (04/09 2345) ? ?Labs: ?Recent Labs  ?  01/26/22 ?1659 01/26/22 ?2305  ?HGB 13.9  --   ?HCT 41.8  --   ?PLT 224  --   ?APTT  --  31  ?LABPROT  --  13.2  ?INR  --  1.0  ?CREATININE 0.67  --   ? ? ?Estimated Creatinine Clearance: 74.5 mL/min (by C-G formula based on SCr of 0.67 mg/dL). ? ? ?Medical History: ?Past Medical History:  ?Diagnosis Date  ? Anemia   ? Graves disease   ? Hypothyroidism   ? ? ? ?Assessment: ?56 y/o F with dizziness, CT angio consistent with left vertebral artery dissection, starting stroke protocol heparin, CBC/renal function good, PTA meds reviewed.  ? ?Goal of Therapy:  ?Heparin level 0.3-0.5 units/ml ?Monitor platelets by anticoagulation protocol: Yes ?  ?Plan:  ?No boluses ?Start heparin drip at 850 units/hr ?0600 heparin level ?Daily CBC and heparin level ?Monitor for bleeding ? ?Narda Bonds, PharmD, BCPS ?Clinical Pharmacist ?Phone: [phone] ? ? ? ?

## 2022-01-27 NOTE — Progress Notes (Signed)
Interventional Radiology Brief Note: ? ?IR consulted for diagnostic angiogram.  ?NPO p MN.  ?Formal consult to follow. ? ?Brynda Greathouse, MS RD PA-C ? ? ?

## 2022-01-27 NOTE — Evaluation (Signed)
Occupational Therapy Evaluation ?Patient Details ?Name: Kristy MaidensCarla D Dolberry ?MRN: [id number] ?DOB: 11/07/1965 ?Today's Date: 01/27/2022 ? ? ?History of Present Illness 56 y.o. female admitted 4/9 with c/o dizziness, N&V. 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 PMH:  hypothyroidism, GERD, iron deficiency anemia  ? ?Clinical Impression ?  ?PTA, pt was living with her husband and was independent; working full time. Currently, pt requires Mod A for UB ADLs and Min Guard A for functional mobility using RW. Pt very motivated throughout and receiving education very well. Pt reporting dizziness and diplopia throughout. Providing occlusion tapping to glasses (nasal portion of L lens) as well as issuing handout with education. Pt would benefit from further acute OT to facilitate safe dc. Recommend dc to AIR for further OT to optimize safety, independence with ADLs, and return to PLOF.  ?   ? ?Recommendations for follow up therapy are one component of a multi-disciplinary discharge planning process, led by the attending physician.  Recommendations may be updated based on patient status, additional functional criteria and insurance authorization.  ? ?Follow Up Recommendations ? Acute inpatient rehab (3hours/day)  ?  ?Assistance Recommended at Discharge    ?Patient can return home with the following   ? ?  ?Functional Status Assessment ?    ?Equipment Recommendations ? BSC/3in1  ?  ?Recommendations for Other Services PT consult ? ? ?  ?Precautions / Restrictions Precautions ?Precautions: Fall  ? ?  ? ?Mobility Bed Mobility ?Overal bed mobility: Needs Assistance ?Bed Mobility: Supine to Sit, Sit to Supine ?  ?  ?Supine to sit: Min guard ?Sit to supine: Min guard ?  ?General bed mobility comments: Increased time ?  ? ?Transfers ?Overall transfer level: Needs assistance ?Equipment used: Rolling walker (2 wheels) ?Transfers: Sit to/from Stand ?Sit to Stand: Min guard ?  ?  ?  ?  ?   ?General transfer comment: Min guard A for safety ?  ? ?  ?Balance Overall balance assessment: Needs assistance ?Sitting-balance support: Feet supported, No upper extremity supported ?Sitting balance-Leahy Scale: Fair ?  ?  ?Standing balance support: During functional activity, Single extremity supported, Bilateral upper extremity supported ?Standing balance-Leahy Scale: Poor ?  ?  ?  ?  ?  ?  ?  ?  ?  ?  ?  ?  ?   ? ?ADL either performed or assessed with clinical judgement  ? ?ADL Overall ADL's : Needs assistance/impaired ?Eating/Feeding: Set up;Sitting ?  ?Grooming: Oral care;Min guard;Standing ?  ?Upper Body Bathing: Min guard;Sitting ?  ?Lower Body Bathing: Minimal assistance;Sit to/from stand ?  ?Upper Body Dressing : Min guard;Sitting ?  ?Lower Body Dressing: Moderate assistance;Sit to/from stand ?  ?Toilet Transfer: Min guard;Ambulation;Rolling walker (2 wheels) (simulated to recliner) ?  ?  ?  ?  ?  ?Functional mobility during ADLs: Min guard;Rolling walker (2 wheels) ?General ADL Comments: Pt presenting with consistent dizziness throughout. Very motivated and responding well to education  ? ? ? ?Vision Baseline Vision/History: 1 Wears glasses (All the time) ?Patient Visual Report: Diplopia ?Vision Assessment?: Yes ?Eye Alignment: Within Functional Limits ?Alignment/Gaze Preference: Within Defined Limits ?Tracking/Visual Pursuits: Able to track stimulus in all quads without difficulty ?Diplopia Assessment: Present in far gaze;Objects split side to side;Disappears with one eye closed ?Additional Comments: R eye dominant. Diplopia for distance vision. Reports diplopia goes away with L eye closed. Provided occlusion to glasses taping the nasal portion of the L lens.  ?   ?  Perception   ?  ?Praxis   ?  ? ?Pertinent Vitals/Pain Pain Assessment ?Pain Assessment: Faces ?Faces Pain Scale: Hurts little more ?Pain Location: L neck/shoulder ?Pain Descriptors / Indicators: Grimacing, Discomfort, Sore ?Pain  Intervention(s): Monitored during session, Repositioned, Patient requesting pain meds-RN notified  ? ? ? ?Hand Dominance Right ?  ?Extremity/Trunk Assessment Upper Extremity Assessment ?Upper Extremity Assessment: LUE deficits/detail ?LUE Deficits / Details: Decreased strength grossly and at grasp. Reports decreased sensation. ?LUE Sensation: decreased light touch ?  ?Lower Extremity Assessment ?Lower Extremity Assessment: Defer to PT evaluation ?LLE Deficits / Details: 4/5 grossly graded ?LLE Sensation: decreased light touch;decreased proprioception ?LLE Coordination: decreased gross motor;decreased fine motor ?  ?Cervical / Trunk Assessment ?Cervical / Trunk Assessment: Normal ?  ?Communication Communication ?Communication: No difficulties ?  ?Cognition Arousal/Alertness: Awake/alert ?Behavior During Therapy: [facility] for tasks assessed/performed ?Overall Cognitive Status: Within Functional Limits for tasks assessed ?  ?  ?  ?  ?  ?  ?  ?  ?  ?  ?  ?  ?  ?  ?  ?  ?General Comments: Very motivated ?  ?  ?General Comments  Husband present throughout ? ?  ?Exercises Exercises: Other exercises ?Other Exercises ?Other Exercises: Practicing looking at stable objects in the environment to decrease dizziness during mobility. ?Other Exercises: providing handout and education on occlusion glasses as well as prop activities and safety ?  ?Shoulder Instructions    ? ? ?Home Living Family/patient expects to be discharged to:: Private residence ?Living Arrangements: Spouse/significant other ?Available Help at Discharge: Family;Available 24 hours/day ?Type of Home: House ?Home Access: Stairs to enter ?Entrance Stairs-Number of Steps: 4 ?Entrance Stairs-Rails: Right ?Home Layout: One level ?  ?  ?Bathroom Shower/Tub: Tub/shower unit ?  ?Bathroom Toilet: Standard ?  ?  ?Home Equipment: None ?  ?  ?  ? ?  ?Prior Functioning/Environment Prior Level of Function : Independent/Modified Independent;Working/employed;Driving ?  ?  ?  ?  ?  ?   ?  ?ADLs Comments: Works full time at [employer] (?). Husband drives. ?  ? ?  ?  ?OT Problem List: Decreased strength;Decreased activity tolerance;Impaired balance (sitting and/or standing);Decreased range of motion;Impaired vision/perception;Decreased knowledge of precautions;Decreased knowledge of use of DME or AE ?  ?   ?OT Treatment/Interventions: Self-care/ADL training;Therapeutic exercise;Energy conservation;DME and/or AE instruction;Therapeutic activities;Patient/family education;Visual/perceptual remediation/compensation  ?  ?OT Goals(Current goals can be found in the care plan section) Acute Rehab OT Goals ?Patient Stated Goal: Get back to normal ?OT Goal Formulation: With patient/family ?Time For Goal Achievement: 02/10/22 ?Potential to Achieve Goals: Good  ?OT Frequency: Min 2X/week ?  ? ?Co-evaluation   ?  ?  ?  ?  ? ?  ?AM-PAC OT "6 Clicks" Daily Activity     ?Outcome Measure Help from another person eating meals?: None ?Help from another person taking care of personal grooming?: A Little ?Help from another person toileting, which includes using toliet, bedpan, or urinal?: A Little ?Help from another person bathing (including washing, rinsing, drying)?: A Little ?Help from another person to put on and taking off regular upper body clothing?: A Little ?Help from another person to put on and taking off regular lower body clothing?: A Little ?6 Click Score: 19 ?  ?End of Session Equipment Utilized During Treatment: Rolling walker (2 wheels) ?Nurse Communication: Mobility status ? ?Activity Tolerance: Patient tolerated treatment well ?Patient left: in bed;with call bell/phone within reach;with family/visitor present ? ?OT Visit Diagnosis: Unsteadiness on feet (R26.81);Other abnormalities  of gait and mobility (R26.89);Muscle weakness (generalized) (M62.81)  ?              ?Time: 2423-5361 ?OT Time Calculation (min): 27 min ?Charges:  OT General Charges ?$OT Visit: 1 Visit ?OT Evaluation ?$OT Eval Moderate  Complexity: 1 Mod ?OT Treatments ?$Self Care/Home Management : 8-22 mins ? ?Garland Smouse MSOT, OTR/L ?Acute Rehab ?Pager: [phone] ?Office: [phone] ? ?Deajah Erkkila M Kacey Vicuna ?01/27/2022, 4:44 PM ?

## 2022-01-27 NOTE — Progress Notes (Signed)
Inpatient Rehab Admissions Coordinator:  ? ?Per therapy recommendations,  patient was screened for CIR candidacy by Doyl Bitting, MS, CCC-SLP. At this time, Pt. Appears to be a a potential candidate for CIR. I will place   order for rehab consult per protocol for full assessment. Please contact me any with questions. ? ?Sallye Lunz, MS, CCC-SLP ?Rehab Admissions Coordinator  ?[phone] (celll) ?[phone] (office) ? ?

## 2022-01-27 NOTE — Progress Notes (Signed)
ANTICOAGULATION CONSULT NOTE  ? ?Pharmacy Consult for Heparin  ?Indication:  Left vertebral artery dissection ? ?No Known Allergies ? ?Patient Measurements: ?Height: 5\' 2"  (157.5 cm) ?Weight: 69.8 kg (153 lb 14.1 oz) ?IBW/kg (Calculated) : 50.1 ?Vital Signs: ?Temp: 97.9 ?F (36.6 ?C) (04/10 0140) ?Temp Source: Oral (04/10 0140) ?BP: 132/78 (04/10 0140) ?Pulse Rate: 57 (04/10 0140) ? ?Labs: ?Recent Labs  ?  01/26/22 ?1659 01/26/22 ?2305 01/27/22 ?0614  ?HGB 13.9  --  13.9  ?HCT 41.8  --  40.2  ?PLT 224  --  182  ?APTT  --  31  --   ?LABPROT  --  13.2  --   ?INR  --  1.0  --   ?HEPARINUNFRC  --   --  0.25*  ?CREATININE 0.67  --   --   ? ? ? ?Estimated Creatinine Clearance: 74.5 mL/min (by C-G formula based on SCr of 0.67 mg/dL). ? ? ?Medical History: ?Past Medical History:  ?Diagnosis Date  ? Anemia   ? Graves disease   ? Hypothyroidism   ? ? ? ?Assessment: ?56 y/o F with dizziness, CT angio consistent with left vertebral artery dissection, starting stroke protocol heparin, CBC/renal function good, PTA meds reviewed.  ? ?4/10 AM update:  ?Heparin level just below goal ? ?Goal of Therapy:  ?Heparin level 0.3-0.5 units/ml ?Monitor platelets by anticoagulation protocol: Yes ?  ?Plan:  ?No boluses ?Inc heparin drip to 950 units/hr ?1400 heparin level ?Daily CBC and heparin level ?Monitor for bleeding ? ?40, PharmD, BCPS ?Clinical Pharmacist ?Phone: [phone] ? ? ? ?

## 2022-01-28 ENCOUNTER — Encounter (HOSPITAL_COMMUNITY): Payer: Self-pay | Admitting: Internal Medicine

## 2022-01-28 ENCOUNTER — Inpatient Hospital Stay (HOSPITAL_COMMUNITY): Payer: 59

## 2022-01-28 DIAGNOSIS — I6389 Other cerebral infarction: Secondary | ICD-10-CM

## 2022-01-28 DIAGNOSIS — E785 Hyperlipidemia, unspecified: Secondary | ICD-10-CM | POA: Diagnosis present

## 2022-01-28 DIAGNOSIS — M542 Cervicalgia: Secondary | ICD-10-CM | POA: Diagnosis present

## 2022-01-28 HISTORY — PX: IR ANGIO VERTEBRAL SEL SUBCLAVIAN INNOMINATE UNI L MOD SED: IMG5364

## 2022-01-28 HISTORY — PX: IR ANGIO VERTEBRAL SEL VERTEBRAL UNI R MOD SED: IMG5368

## 2022-01-28 HISTORY — PX: IR ANGIO INTRA EXTRACRAN SEL COM CAROTID INNOMINATE BILAT MOD SED: IMG5360

## 2022-01-28 HISTORY — PX: IR US GUIDE VASC ACCESS RIGHT: IMG2390

## 2022-01-28 LAB — ECHOCARDIOGRAM COMPLETE
AR max vel: 1.75 cm2
AV Area VTI: 1.55 cm2
AV Area mean vel: 1.58 cm2
AV Mean grad: 4 mmHg
AV Peak grad: 7 mmHg
Ao pk vel: 1.32 m/s
Area-P 1/2: 2.74 cm2
Height: 62 in
S' Lateral: 2.8 cm
Weight: 2462.1 oz

## 2022-01-28 LAB — CBC
HCT: 37.3 % (ref 36.0–46.0)
Hemoglobin: 12.7 g/dL (ref 12.0–15.0)
MCH: 31.6 pg (ref 26.0–34.0)
MCHC: 34 g/dL (ref 30.0–36.0)
MCV: 92.8 fL (ref 80.0–100.0)
Platelets: 195 10*3/uL (ref 150–400)
RBC: 4.02 MIL/uL (ref 3.87–5.11)
RDW: 12.7 % (ref 11.5–15.5)
WBC: 4.4 10*3/uL (ref 4.0–10.5)
nRBC: 0 % (ref 0.0–0.2)

## 2022-01-28 LAB — HEPARIN LEVEL (UNFRACTIONATED): Heparin Unfractionated: 0.46 IU/mL (ref 0.30–0.70)

## 2022-01-28 MED ORDER — NITROGLYCERIN 1 MG/10 ML FOR IR/CATH LAB: INTRA_ARTERIAL | Status: AC | PRN

## 2022-01-28 MED ORDER — SODIUM CHLORIDE 0.9 % IV SOLN: INTRAVENOUS | Status: DC

## 2022-01-28 MED ORDER — ASPIRIN EC 81 MG PO TBEC
81.0000 mg | DELAYED_RELEASE_TABLET | Freq: Every day | ORAL | Status: DC
  Administered 2022-01-28 – 2022-01-30 (×3): 81 mg via ORAL
  Filled 2022-01-28 (×3): qty 1

## 2022-01-28 MED ORDER — HEPARIN SODIUM (PORCINE) 1000 UNIT/ML IJ SOLN
INTRAMUSCULAR | Status: AC
Start: 2022-01-28 — End: 2022-01-28
  Filled 2022-01-28: qty 10

## 2022-01-28 MED ORDER — HYDRALAZINE HCL 20 MG/ML IJ SOLN
INTRAMUSCULAR | Status: AC
  Filled 2022-01-28: qty 1

## 2022-01-28 MED ORDER — LIDOCAINE HCL (PF) 1 % IJ SOLN
INTRAMUSCULAR | Status: AC | PRN
  Administered 2022-01-28: 5 mL

## 2022-01-28 MED ORDER — VERAPAMIL HCL 2.5 MG/ML IV SOLN
INTRAVENOUS | Status: AC
Start: 2022-01-28 — End: 2022-01-28
  Filled 2022-01-28: qty 2

## 2022-01-28 MED ORDER — CLOPIDOGREL BISULFATE 75 MG PO TABS
75.0000 mg | ORAL_TABLET | Freq: Every day | ORAL | Status: DC
  Administered 2022-01-28 – 2022-01-30 (×3): 75 mg via ORAL
  Filled 2022-01-28 (×3): qty 1

## 2022-01-28 MED ORDER — NITROGLYCERIN 1 MG/10 ML FOR IR/CATH LAB
INTRA_ARTERIAL | Status: AC | PRN
  Administered 2022-01-28 (×2): 200 ug

## 2022-01-28 MED ORDER — MIDAZOLAM HCL 2 MG/2ML IJ SOLN
INTRAMUSCULAR | Status: AC
Start: 2022-01-28 — End: 2022-01-29
  Filled 2022-01-28: qty 2

## 2022-01-28 MED ORDER — CYCLOBENZAPRINE HCL 10 MG PO TABS
5.0000 mg | ORAL_TABLET | Freq: Three times a day (TID) | ORAL | Status: DC
  Administered 2022-01-28 – 2022-01-30 (×6): 5 mg via ORAL
  Filled 2022-01-28 (×6): qty 1

## 2022-01-28 MED ORDER — LIDOCAINE HCL 1 % IJ SOLN
INTRAMUSCULAR | Status: AC
  Filled 2022-01-28: qty 20

## 2022-01-28 MED ORDER — NITROGLYCERIN 1 MG/10 ML FOR IR/CATH LAB
INTRA_ARTERIAL | Status: AC | PRN
  Administered 2022-01-28: 200 ug
  Administered 2022-01-28: 100 ug

## 2022-01-28 MED ORDER — BUTALBITAL-APAP-CAFFEINE 50-325-40 MG PO TABS: 1.0000 | ORAL_TABLET | Freq: Four times a day (QID) | ORAL | Status: DC | PRN

## 2022-01-28 MED ORDER — CLOPIDOGREL BISULFATE 75 MG PO TABS
300.0000 mg | ORAL_TABLET | Freq: Once | ORAL | Status: AC
  Administered 2022-01-28: 300 mg via ORAL
  Filled 2022-01-28: qty 4

## 2022-01-28 MED ORDER — FENTANYL CITRATE (PF) 100 MCG/2ML IJ SOLN
INTRAMUSCULAR | Status: AC | PRN
  Administered 2022-01-28: 12.5 ug via INTRAVENOUS
  Administered 2022-01-28 (×2): 25 ug via INTRAVENOUS

## 2022-01-28 MED ORDER — HYDRALAZINE HCL 20 MG/ML IJ SOLN
INTRAMUSCULAR | Status: AC | PRN
Start: 2022-01-28 — End: 2022-01-28
  Administered 2022-01-28: 5 mg via INTRAVENOUS

## 2022-01-28 MED ORDER — IOHEXOL 300 MG/ML  SOLN
100.0000 mL | Freq: Once | INTRAMUSCULAR | Status: AC | PRN
  Administered 2022-01-28: 5 mL via INTRA_ARTERIAL

## 2022-01-28 MED ORDER — FENTANYL CITRATE (PF) 100 MCG/2ML IJ SOLN
INTRAMUSCULAR | Status: AC
  Filled 2022-01-28: qty 2

## 2022-01-28 MED ORDER — IOHEXOL 300 MG/ML  SOLN
100.0000 mL | Freq: Once | INTRAMUSCULAR | Status: AC | PRN
  Administered 2022-01-28: 60 mL via INTRA_ARTERIAL

## 2022-01-28 MED ORDER — MIDAZOLAM HCL 2 MG/2ML IJ SOLN
INTRAMUSCULAR | Status: AC | PRN
  Administered 2022-01-28: .5 mg via INTRAVENOUS
  Administered 2022-01-28: 1 mg via INTRAVENOUS

## 2022-01-28 MED ORDER — NITROGLYCERIN 1 MG/10 ML FOR IR/CATH LAB
INTRA_ARTERIAL | Status: AC
  Filled 2022-01-28: qty 10

## 2022-01-28 NOTE — Progress Notes (Signed)
Notified by IR nurse ready for pt. Requested to stop heparin. Documented in [facility] stop time. ? ?Brooke Pace, RN ? ?

## 2022-01-28 NOTE — Consult Note (Signed)
? ? ? ?Chief Complaint: ?Vertebral artery dissection ? ?Referring Physician(s): ?Pearlean Brownie ? ?Supervising Physician: Julieanne Cotton ? ?Patient Status: [facility] - In-pt ? ?History of Present Illness: ?Kristy Abbott is a 56 y.o. female with medical history significant for hypothyroidism, GERD, and iron deficiency anemia. ? ?She presented to the ED on 01/26/22 c/o dizziness that occurred when she bent over to put her dog on the floor. ? ?CTA showed= ?1. Long segment narrowing and multifocal complete loss of ?opacification of the left vertebral artery, new since 01/23/2021, ?consistent with dissection. Consultation for possible intervention ?is recommended. ?2. Intracranial and cervical arteries otherwise normal. ? ?MRI showed= ?1. Subtle acute infarct in the left cerebellar vermis in association ?with the abnormal left vertebral artery suspected to reflect ?dissection. No associated hemorrhage or mass effect. ?2. Otherwise stable and negative noncontrast MRI appearance of the ?brain. ? ?NIR is asked to evaluate for diagnostic cerebral angiography. ? ?She is NPO. She did have some nausea with the dizzy spell, but no vomiting. No Fever/chills. Otherwise ROS negative. ? ?Past Medical History:  ?Diagnosis Date  ? Anemia   ? Graves disease   ? Hypothyroidism   ? ? ?Past Surgical History:  ?Procedure Laterality Date  ? CESAREAN SECTION    ? COLONOSCOPY N/A 11/10/2018  ? Procedure: COLONOSCOPY;  Surgeon: West Bali, MD;  Location: [facility];  Service: Endoscopy;  Laterality: N/A;  1:30  ? Cyst removed from neck    ? TOTAL ABDOMINAL HYSTERECTOMY  04/07/2019  ? Dr. Ernestina Penna  ? ? ?Allergies: ?Patient has no known allergies. ? ?Medications: ?Prior to Admission medications   ?Medication Sig Start Date End Date Taking? Authorizing Provider  ?cetirizine (ZYRTEC ALLERGY) 10 MG tablet Take 1 tablet (10 mg total) by mouth daily. 11/05/19   Durward Parcel, FNP  ?Ferrous Sulfate (IRON SLOW RELEASE) 142 (45 Fe) MG TBCR Take 45  mg by mouth every other day.    [provider]  ?levothyroxine (SYNTHROID, LEVOTHROID) 88 MCG tablet Take 88 mcg by mouth daily before breakfast.    [provider]  ?meclizine (ANTIVERT) 25 MG tablet Take 1 tablet (25 mg total) by mouth 3 (three) times daily as needed for dizziness. 01/23/21   Couture, Cortni S, PA-C  ?naproxen sodium (ALEVE) 220 MG tablet Take 440 mg by mouth daily as needed (pain).    [provider]  ?omeprazole (PRILOSEC) 40 MG capsule Take 40 mg by mouth daily.    [provider]  ?  ? ?Family History  ?Problem Relation Age of Onset  ? CAD Mother   ? Diabetes Mother   ? Colon cancer Neg Hx   ? Colon polyps Neg Hx   ? ? ?Social History  ? ?Socioeconomic History  ? Marital status: Married  ?  Spouse name: Not on file  ? Number of children: Not on file  ? Years of education: Not on file  ? Highest education level: Not on file  ?Occupational History  ? Not on file  ?Tobacco Use  ? Smoking status: Former  ?  Packs/day: 1.00  ?  Types: Cigarettes  ?  Start date: 06/07/1981  ?  Quit date: 10/20/1998  ?  Years since quitting: 23.2  ? Smokeless tobacco: Never  ?Vaping Use  ? Vaping Use: Never used  ?Substance and Sexual Activity  ? Alcohol use: No  ? Drug use: No  ? Sexual activity: Yes  ?Other Topics Concern  ? Not on file  ?Social  History Narrative  ? 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  ? ?Social Determinants of Health  ? ?Financial Resource Strain: Not on file  ?Food Insecurity: Not on file  ?Transportation Needs: Not on file  ?Physical Activity: Not on file  ?Stress: Not on file  ?Social Connections: Not on file  ? ? ? ?Review of Systems: A 12 point ROS discussed and pertinent positives are indicated in the HPI above.  All other systems are negative. ? ?Review of Systems ? ?Vital Signs: ?BP (!) 145/77 (BP Location: Right Arm)   Pulse (!) 56   Temp 97.9 ?F (36.6 ?C) (Oral)   Resp 16   Ht 5\' 2"  (1.575 m)   Wt  153 lb 14.1 oz (69.8 kg)   LMP 04/14/2019   SpO2 98%   BMI 28.15 kg/m?  ? ?Physical Exam ?Vitals reviewed.  ?Constitutional:   ?   Appearance: Normal appearance.  ?HENT:  ?   Head: Normocephalic and atraumatic.  ?Eyes:  ?   Extraocular Movements: Extraocular movements intact.  ?Cardiovascular:  ?   Rate and Rhythm: Normal rate and regular rhythm.  ?Pulmonary:  ?   Effort: Pulmonary effort is normal. No respiratory distress.  ?   Breath sounds: Normal breath sounds.  ?Abdominal:  ?   Palpations: Abdomen is soft.  ?Musculoskeletal:     ?   General: Normal range of motion.  ?   Cervical back: Normal range of motion.  ?Skin: ?   General: Skin is warm and dry.  ?Neurological:  ?   General: No focal deficit present.  ?   Mental Status: She is alert and oriented to person, place, and time.  ?Psychiatric:     ?   Mood and Affect: Mood normal.     ?   Behavior: Behavior normal.     ?   Thought Content: Thought content normal.     ?   Judgment: Judgment normal.  ? ? ?Imaging: ?CT Angio Head W or Wo Contrast ? ?Result Date: 01/26/2022 ?CLINICAL DATA:  Dizziness and nausea EXAM: CT ANGIOGRAPHY HEAD AND NECK TECHNIQUE: Multidetector CT imaging of the head and neck was performed using the standard protocol during bolus administration of intravenous contrast. Multiplanar CT image reconstructions and MIPs were obtained to evaluate the vascular anatomy. Carotid stenosis measurements (when applicable) are obtained utilizing NASCET criteria, using the distal internal carotid diameter as the denominator. RADIATION DOSE REDUCTION: This exam was performed according to the departmental dose-optimization program which includes automated exposure control, adjustment of the mA and/or kV according to patient size and/or use of iterative reconstruction technique. CONTRAST:  2mL OMNIPAQUE IOHEXOL 350 MG/ML SOLN COMPARISON:  01/23/2021 FINDINGS: CTA NECK FINDINGS SKELETON: There is no bony spinal canal stenosis. No lytic or blastic lesion.  OTHER NECK: Normal pharynx, larynx and major salivary glands. No cervical lymphadenopathy. Unremarkable thyroid gland. UPPER CHEST: No pneumothorax or pleural effusion. No nodules or masses. AORTIC ARCH: There is no calcific atherosclerosis of the aortic arch. There is no aneurysm, dissection or hemodynamically significant stenosis of the visualized portion of the aorta. Conventional 3 vessel aortic branching pattern. The visualized proximal subclavian arteries are widely patent. RIGHT CAROTID SYSTEM: Normal without aneurysm, dissection or stenosis. LEFT CAROTID SYSTEM: Normal without aneurysm, dissection or stenosis. VERTEBRAL ARTERIES: Codominant configuration. Long segment narrowing and multifocal complete loss of opacification of the left vertebral artery which is new since 01/25/2021 and likely indicates dissection. Right vertebral artery is  normal. CTA HEAD FINDINGS POSTERIOR CIRCULATION: --Vertebral arteries: Limited opacification of the left V4 segment. Right is normal. --Inferior cerebellar arteries: Both PICA are opacified. --Basilar artery: Normal. --Superior cerebellar arteries: Normal. --Posterior cerebral arteries (PCA): Normal. ANTERIOR CIRCULATION: --Intracranial internal carotid arteries: Normal. --Anterior cerebral arteries (ACA): Normal. Both A1 segments are present. Patent anterior communicating artery (a-comm). --Middle cerebral arteries (MCA): Normal. VENOUS SINUSES: As permitted by contrast timing, patent. ANATOMIC VARIANTS: None Review of the MIP images confirms the above findings. IMPRESSION: 1. Long segment narrowing and multifocal complete loss of opacification of the left vertebral artery, new since 01/23/2021, consistent with dissection. Consultation for possible intervention is recommended. 2. Intracranial and cervical arteries otherwise normal. Critical Value/emergent results were called by telephone at the time of interpretation on 01/26/2022 at 9:32 pm to provider Mertha BaarsLAUREN AUTRY , who  verbally acknowledged these results. Electronically Signed   By: Deatra RobinsonKevin  Herman M.D.   On: 01/26/2022 21:33  ? ?CT ANGIO NECK W OR WO CONTRAST ? ?Result Date: 01/26/2022 ?CLINICAL DATA:  Dizziness and nausea EXAM: CT

## 2022-01-28 NOTE — Progress Notes (Addendum)
STROKE TEAM PROGRESS NOTE  ? ?INTERVAL HISTORY ?Patient seen in room with no family at the bedside.  She states that her dizziness is persistent, and she is awaiting a diagnostic angiogram.  She has been hemodynamically stable and her neurological exam is unchanged. ?Diagnostic cerebral catheter angiogram shows occluded left vertebral artery at its origin with some distal reconstitution through muscular collaterals.  Retrograde filling of the terminal left vertebral artery up to the PICA from the right vertebral. ?Vitals:  ? 01/28/22 1230 01/28/22 1235 01/28/22 1240 01/28/22 1245  ?BP: (!) 145/92 (!) 177/93 (!) 172/93 (!) 172/87  ?Pulse: 63 (!) 57 (!) 49 (!) 57  ?Resp: 19 (!) 22 (!) 22 18  ?Temp:      ?TempSrc:      ?SpO2: 97% 98% 98% 98%  ?Weight:      ?Height:      ? ?CBC:  ?Recent Labs  ?Lab 01/26/22 ?2305 01/27/22 ?5366 01/28/22 ?0118  ?WBC  --  4.7 4.4  ?NEUTROABS 2.4  --   --   ?HGB  --  13.9 12.7  ?HCT  --  40.2 37.3  ?MCV  --  92.4 92.8  ?PLT  --  182 195  ? ? ?Basic Metabolic Panel:  ?Recent Labs  ?Lab 01/26/22 ?1659 01/27/22 ?4403  ?NA 139 141  ?K 3.8 3.7  ?CL 107 109  ?CO2 24 23  ?GLUCOSE 113* 88  ?BUN 16 13  ?CREATININE 0.67 0.60  ?CALCIUM 9.7 9.2  ?MG  --  2.1  ?PHOS  --  3.1  ? ? ?Lipid Panel:  ?Recent Labs  ?Lab 01/27/22 ?4742  ?CHOL 155  ?TRIG 55  ?HDL 56  ?CHOLHDL 2.8  ?VLDL 11  ?LDLCALC 88  ? ? ?HgbA1c:  ?Recent Labs  ?Lab 01/27/22 ?5956  ?HGBA1C 4.9  ? ?Urine Drug Screen:  ?Recent Labs  ?Lab 01/26/22 ?1559  ?LABOPIA NONE DETECTED  ?COCAINSCRNUR NONE DETECTED  ?LABBENZ NONE DETECTED  ?AMPHETMU NONE DETECTED  ?THCU NONE DETECTED  ?LABBARB NONE DETECTED  ? ?  ?Alcohol Level  ?Recent Labs  ?Lab 01/26/22 ?2304  ?ETH <10  ? ? ? ?IMAGING past 24 hours ?No results found. ? ?PHYSICAL EXAM ? ?Physical Exam  ?Constitutional: Appears well-developed and well-nourished middle-age Caucasian lady.  ?Cardiovascular: Normal rate and regular rhythm.  ?Respiratory: Effort normal, non-labored breathing on 2L O2 via  [place] ? ?Neuro: ?Mental Status: ?Patient is awake, alert, oriented to person, place, month, year, and situation. ?Patient is able to give a clear and coherent history. ?No signs of aphasia or neglect ?Cranial Nerves: ?II: Visual Fields are full. Pupils are equal, round, and reactive to light.   ?III,IV, VI: EOMI, No nystagmus ?V: Facial sensation is diminished on the left ?VII: Facial movement is symmetric resting and smiling ?VIII: Hearing is intact to voice ?XII: Tongue protrudes midline without atrophy or fasciculations.  ? ? ?ASSESSMENT/PLAN ?[name] is a 56 y.o. female with history of anemia, Graves' disease presenting with dizziness, weakness and numbness on the left side. Chart review from 01/23/2021 when she had a similar episode at [facility].  This episode was ruled a complicated migraine, no LVO noted on previous CTA.  CTA head and neck showed left vertebral artery dissection.  Heparin drip initiated with pharmacy dosing, plan to transition to DAPT therapy today.  2D echo pending.  CIR candidacy work-up pending ? 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 ?CTA head & neck long segment narrowing and multifocal complete loss of opacification of the left vertebral artery mostly in the neck and partly intradurally as well.  This was widely patent in 2022 ?Basilar artery is patent ?MRI  acute infarct in the left cerebellar vermis  ?2D Echo pending ?LDL 88 ?HgbA1c 5.2 ?VTE prophylaxis -heparin IV ?No antithrombotic prior to admission, now on aspirin 81 mg daily and clopidogrel 75 mg daily. x 3 months and then aspirin alone ?Therapy recommendations: CIR ?Disposition: Pending ? ?Nausea, Dizziness ?Home med: Meclizine ?Resume meclizine, zofran PRN ?PT/OT evals ? ?Hypertension ?Home meds:  None ?Stable ?Permissive hypertension (OK if < 180) but gradually normalize in 5-7 days ?Long-term BP goal  normotensive ? ?Hyperlipidemia ?Home meds:  None ?LDL 88, goal < 70 ?Add Crestor 20mg   ?Continue statin at discharge ? ?Other Stroke Risk Factors ?Hx stroke/TIA ?Stroke workup done 01/23/2021 at [facility] ?Similar episode of dizziness, ruled complicated migraine ?CTA head and neck- 01/23/2021  no LVO, but left VA origin severe stenosis, however, the VA distal filling post the stenosis showed no decreased flow. Basilar artery and right VA are patent.   ? ?Other Active Problems ?Hypothyroidism ?synthroid ?GERD ?Home med: Prilosec ? ?Hospital day # 2 ? ?Patient seen and examined by NP/APP with MD. MD to update note as needed.  ? ?Cortney E 03/25/2021 , MSN, AGACNP-BC ?[facility] ?See Amion for schedule and pager information ?01/28/2022 12:54 PM ? I have personally obtained history,examined this patient, reviewed notes, independently viewed imaging studies, participated in medical decision making and plan of care.ROS completed by me personally and pertinent positives fully documented  I have made any additions or clarifications directly to the above note. Agree with note above.  Patient states she is showing slight improvement in her dizziness and diagnostic catheter angiogram confirms left vertebral artery occlusion at the origin with some distal reconstitution to muscular collaterals as well as retrograde filling of the terminal left vertebral artery up to the PICA from the right vertebral.  Recommend dual antiplatelet therapy of aspirin Plavix for 3 months followed by aspirin alone and aggressive risk factor modification.  Mobilize out of bed.  Therapy consults.  Transfer to inpatient rehab when bed available.  Discussed with Dr. 03/30/2022.  Stroke team will sign off.  Kindly call for questions.  Greater than 50% time during the 35-minute visit was spent in counseling and coordination of care about her vertebral artery occlusion and cerebral stroke and discussion about stroke prevention and treatment  and answering questions. ? ?Allena Katz, MD ?Medical Director ?[facility] ?Pager: [phone] ?01/28/2022 2:04 PM ? ? ? ?To contact Stroke Continuity provider, please refer to 03/30/2022. ?After hours, contact General Neurology ? ?

## 2022-01-28 NOTE — Progress Notes (Signed)
ANTICOAGULATION CONSULT NOTE  ? ?Pharmacy Consult for Heparin  ?Indication:  Left vertebral artery dissection ? ?No Known Allergies ? ?Patient Measurements: ?Height: 5\' 2"  (157.5 cm) ?Weight: 69.8 kg (153 lb 14.1 oz) ?IBW/kg (Calculated) : 50.1 ?Vital Signs: ?Temp: 97.5 ?F (36.4 ?C) (04/11 0413) ?Temp Source: Oral (04/11 0413) ?BP: 104/58 (04/11 0413) ?Pulse Rate: 42 (04/11 0413) ? ?Labs: ?Recent Labs  ?  01/26/22 ?1659 01/26/22 ?1659 01/26/22 ?2305 01/27/22 ?03/29/22 01/27/22 ?1414 01/27/22 ?2118 01/28/22 ?0118  ?HGB 13.9  --   --  13.9  --   --  12.7  ?HCT 41.8  --   --  40.2  --   --  37.3  ?PLT 224  --   --  182  --   --  195  ?APTT  --   --  31  --   --   --   --   ?LABPROT  --   --  13.2  --   --   --   --   ?INR  --   --  1.0  --   --   --   --   ?HEPARINUNFRC  --    < >  --  0.25* 0.27* 0.46 0.46  ?CREATININE 0.67  --   --  0.60  --   --   --   ? < > = values in this interval not displayed.  ? ? ? ?Estimated Creatinine Clearance: 74.5 mL/min (by C-G formula based on SCr of 0.6 mg/dL). ? ? ?Medical History: ?Past Medical History:  ?Diagnosis Date  ? Anemia   ? Graves disease   ? Hypothyroidism   ? ? ? ?Assessment: ?56 y/o F with dizziness, CT angio consistent with left vertebral artery dissection, starting stroke protocol heparin, CBC/renal function good, PTA meds reviewed.  ? ?Heparin level of 0.46 is therapeutic on heparin 1000 units/hr.  ? ?Goal of Therapy:  ?Heparin level 0.3-0.5 units/ml ?Monitor platelets by anticoagulation protocol: Yes ?  ?Plan:  ?Continue heparin 1000 units/hr  ?Follow up AM heparin level  ?Monitor heparin level, CBC and s/s of bleeding daily  ? ?40, PharmD, FCCM ?Clinical Pharmacist ?Please see AMION for all Pharmacists' Contact Phone Numbers ?01/28/2022, 7:22 AM  ? ? ? ? ? ?

## 2022-01-28 NOTE — Progress Notes (Signed)
Echocardiogram ?2D Echocardiogram has been performed. ? ?Kristy Abbott ?01/28/2022, 10:14 AM ?

## 2022-01-28 NOTE — Progress Notes (Signed)
Pt arrived back to 4E from IR. Right radial TR band in place with 11cc air. Absent of bleeding. Pt presents with raised, reddened area on right arm proximal to sheath insertion site. Area marked. CN and IR PA notified. Will monitor area. Pt A&Ox4. VSS. Husband at bedside. ? ?Raelyn Number, RN ? ?

## 2022-01-28 NOTE — Sedation Documentation (Signed)
Right radial sheath removed, TR band applied with 11cc of air in TR band at 1259 ?

## 2022-01-28 NOTE — Progress Notes (Signed)
?  Called by RN to evaluate right arm. She was concerned it was a contrast extravasation. ? ?I reassured her not contrast was used in the arm, only the cerebral arteries. ? ?On exam, there is some mild erythema and very mild swelling. It is not tender to touch. ? ?The TR Band is still in place so I am unable to assess radial pulse. ? ?Patient is not complaining of any discomfort.  ? ?Photo taken so NIR APP can compare the area on tomorrows exam.  ? ? ? ?Ok to use a warm compress if patient desires. ? ?Gwynneth Macleod PA-C ?01/28/2022 ?2:17 PM ? ? ? ? ?

## 2022-01-28 NOTE — Assessment & Plan Note (Signed)
Continue PPI ?

## 2022-01-28 NOTE — Progress Notes (Signed)
PT Cancellation Note ? ?Patient Details ?Name: Kristy Abbott ?MRN: [id number] ?DOB: [birth year] ? ? ?Cancelled Treatment:    Reason Eval/Treat Not Completed: Other (comment) (Pt at procedure in AM then TR band still inflated when re-attempt ~2:30pm.) Will continue efforts per PT plan of care as schedule permits. ? ? ?Earnestine Shipp M Taylen Wendland ?01/28/2022, 5:09 PM ? ? ?

## 2022-01-28 NOTE — Assessment & Plan Note (Signed)
Continue Synthroid °

## 2022-01-28 NOTE — Assessment & Plan Note (Signed)
CT angiogram of head and neck showed left vertebral artery dissection ?MRI shows acute infarct in the left cerebral Beller vermis. ?Initiated on IV heparin. ?Neurology was consulted. ?Echocardiogram shows preserved EF without any significant valvular abnormality. ?LDL 88.  Currently on statin.  Hemoglobin A1c 5.2. ?PT OT consulted.  Recommend CIR. ?Sparse speech evaluation. ?No antithrombotic prior to admission. ?Currently neurology recommends to discontinue heparin drip and recommend aspirin and Plavix for 3 months followed by aspirin alone and aggressive risk factor modification. ?I have informed CIR that patient medically ready to transition to CIR when bed is available. ?

## 2022-01-28 NOTE — Hospital Course (Addendum)
Kristy Abbott is a 56 y.o. female past history of anemia, Graves' disease, presented to the emergency room at [facility] complaining of dizziness.  ?Found to have left vertebral artery occlusion and acute cerebellar stroke.  Transferred to [facility] further work-up and evaluation. ?Underwent angiography.  Vertebral dissection ruled out. ?Now medically stable.  Awaiting CIR placement. ?

## 2022-01-28 NOTE — Progress Notes (Signed)
OT Cancellation Note ? ?Patient Details ?Name: Kristy Abbott ?MRN: [id number] ?DOB: [birth year] ? ? ?Cancelled Treatment:    Reason Eval/Treat Not Completed: Patient at procedure or test/ unavailable (Off the floor at IR. Will return as schedule allows.) ? ?Jhonatan Lomeli M Keiarah Orlowski ?Kapena Hamme MSOT, OTR/L ?Acute Rehab ?Pager: [phone] ?Office: [phone] ?01/28/2022, 12:12 PM ?

## 2022-01-28 NOTE — Progress Notes (Signed)
?  Progress Note ?Patient: Kristy Abbott:[id number] DOB: 03/01/1966 DOA: 01/26/2022  ?DOS: the patient was seen and examined on 01/28/2022 ? ?Brief hospital course: ?EMRYS MCEACHRON is a 56 y.o. female past history of anemia, Graves' disease, presented to the emergency room at [facility] complaining of dizziness.  ?Found to have left vertebral artery occlusion and acute cerebellar stroke.  Transferred to [facility] further work-up and evaluation. ?Underwent angiography.  Vertebral dissection ruled out. ?Now medically stable.  Awaiting CIR placement. ? ?Assessment and Plan: ?* Vertebral artery occlusion ?CT angiogram of head and neck showed left vertebral artery dissection ?MRI shows acute infarct in the left cerebral Beller vermis. ?Initiated on IV heparin. ?Neurology was consulted. ?Echocardiogram shows preserved EF without any significant valvular abnormality. ?LDL 88.  Currently on statin.  Hemoglobin A1c 5.2. ?PT OT consulted.  Recommend CIR. ?Sparse speech evaluation. ?No antithrombotic prior to admission. ?Currently neurology recommends to discontinue heparin drip and recommend aspirin and Plavix for 3 months followed by aspirin alone and aggressive risk factor modification. ?I have informed CIR that patient medically ready to transition to CIR when bed is available. ? ?Headache ?Neck pain. ?Continue Tylenol and Flexeril. ? ?Hyperlipidemia ?On Crestor 20 mg. ? ?GERD (gastroesophageal reflux disease) ?Continue PPI. ? ?Hypothyroidism ?Continue Synthroid. ? ?Subjective: No nausea no vomiting no fever no chills.  Continues to have pain in the left side of the neck.  Also have frontal headache. ? ?Physical Exam: ?Vitals:  ? 01/28/22 1430 01/28/22 1500 01/28/22 1730 01/28/22 1831  ?BP: 116/83 132/79  (!) 150/79  ?Pulse: 84 74 70 (!) 57  ?Resp: 20 20 15 20   ?Temp:    97.9 ?F (36.6 ?C)  ?TempSrc:    Oral  ?SpO2:    97%  ?Weight:      ?Height:      ? ?General: Appear in mild distress; no visible  Abnormal Neck Mass Or lumps, Conjunctiva normal ?Cardiovascular: S1 and S2 Present, no Murmur, ?Respiratory: good respiratory effort, Bilateral Air entry present and CTA, no Crackles, no wheezes ?Abdomen: Bowel Sound present, Non tender ?Extremities: no Pedal edema ?Neurology: alert and oriented to time, place, and person left-sided weakness and numbness in both lower and lower extremity improving.  Still has blurry vision ?Gait not checked due to patient safety concerns  ? ?Data Reviewed: ?I have Reviewed nursing notes, Vitals, and Lab results since pt's last encounter. Pertinent lab results CBC and BMP ?I have discussed pt's care plan and test results with CIR and neurology.  ? ?Family Communication: Husband at bedside ? ?Disposition: ?Status is: Inpatient ?Remains inpatient appropriate because: Awaiting CIR placement.  Medically stable. ? ?Author: ? , MD ?01/28/2022 7:29 PM ? ?For on call review [email or web address]. ?

## 2022-01-28 NOTE — Procedures (Signed)
INR. ?Bilateral common carotid, right vertebral and left subclavian arteriograms. ?Right radial approach. ?Findings. ?1.  Occluded left vertebral artery at its origin with partial reconstitution from muscular branches arising from the ascending cervical branch of the left thyrocervical trunk. ?2.  Dominant right vertebral artery retrogradely opacifies the left vertebral basilar junction to the left posterior inferior cerebral artery. ? ?Fatima Sanger MD ?

## 2022-01-28 NOTE — Assessment & Plan Note (Signed)
On Crestor 20mg  

## 2022-01-28 NOTE — Assessment & Plan Note (Signed)
Neck pain. ?Continue Tylenol and Flexeril. ?

## 2022-01-29 DIAGNOSIS — I6509 Occlusion and stenosis of unspecified vertebral artery: Secondary | ICD-10-CM

## 2022-01-29 LAB — CBC
HCT: 36.8 % (ref 36.0–46.0)
Hemoglobin: 12.6 g/dL (ref 12.0–15.0)
MCH: 31.4 pg (ref 26.0–34.0)
MCHC: 34.2 g/dL (ref 30.0–36.0)
MCV: 91.8 fL (ref 80.0–100.0)
Platelets: 189 10*3/uL (ref 150–400)
RBC: 4.01 MIL/uL (ref 3.87–5.11)
RDW: 12.7 % (ref 11.5–15.5)
WBC: 3.9 10*3/uL — ABNORMAL LOW (ref 4.0–10.5)
nRBC: 0 % (ref 0.0–0.2)

## 2022-01-29 NOTE — Progress Notes (Signed)
Inpatient Rehab Admissions Coordinator:  ? ? I do not have insurance auth or a bed for this Pt. This AM. I will follow for potential admit pending insurance auth. Pt. States she does want to come if insurance approves. ? ?Clemens Catholic, MS, CCC-SLP ?Rehab Admissions Coordinator  ?[phone] (celll) ?[phone] (office) ? ?

## 2022-01-29 NOTE — Progress Notes (Addendum)
Physical Therapy Treatment ?Patient Details ?Name: Kristy Abbott ?MRN: [id number] ?DOB: Nov 01, 1965 ?Today's Date: 01/29/2022 ? ? ?History of Present Illness 56 y.o. female admitted 4/9 with c/o dizziness, N&V. 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 PMH:  hypothyroidism, GERD, iron deficiency anemia ? ?  ?PT Comments  ? ? Pt received in supine in bed. Pt was very agreeable and eager to participate in the physical therapy session today. Emphasis on gait safety with RW use. Pt needing up to min guard assist for household distance gait task. Plan to progress gait with less restrictive device next session with further dynamic balance training. Pt would continue to benefit from physical therapy and is continuing to make progress towards goals. ?  ?Recommendations for follow up therapy are one component of a multi-disciplinary discharge planning process, led by the attending physician.  Recommendations may be updated based on patient status, additional functional criteria and insurance authorization. ? ?Follow Up Recommendations ? Acute inpatient rehab (3hours/day) ?  ?  ?Assistance Recommended at Discharge PRN  ?Patient can return home with the following A little help with walking and/or transfers;Help with stairs or ramp for entrance ?  ?Equipment Recommendations ? Other (comment) (TBD post-acute; currently RW)  ?  ?Recommendations for Other Services   ? ? ?  ?Precautions / Restrictions Precautions ?Precautions: Fall ?Restrictions ?Weight Bearing Restrictions: No  ?  ? ?Mobility ? Bed Mobility ? Overall bed mobility: Needs Assistance ?Bed Mobility: Supine to Sit, Sit to Supine ?  ?  ?Supine to sit: Supervision ?Sit to supine: Supervision ?  ?General bed mobility comments: Increased time ?  ? ?Transfers ?Overall transfer level: Needs assistance ?Equipment used: Rolling walker (2 wheels) ?Transfers: Sit to/from Stand ?Sit to Stand: Min guard ?  ?  ?  ?  ?  ?General  transfer comment: Min guard A for safety ?  ? ?Ambulation/Gait ?Ambulation/Gait assistance: Min guard ?Gait Distance (Feet):  (95) ?Assistive device: Rolling walker (2 wheels) ?Gait Pattern/deviations: Step-through pattern, Decreased stride length, Shuffle ?Gait velocity: very slow cadence ? General Gait Details: short, shuffled steps, cues for heel strike ?  ?  ? ? ? ?  ?Balance Overall balance assessment: Modified Independent ?Sitting-balance support: Feet supported, No upper extremity supported ?Sitting balance-Leahy Scale: Good ?  ?  ?Standing balance support: During functional activity, Single extremity supported, Bilateral upper extremity supported ?Standing balance-Leahy Scale: Good ?Standing balance comment: reliant on external assist ?  ?  ?  ? ?  ?Cognition Arousal/Alertness: Awake/alert ?Behavior During Therapy: [facility] for tasks assessed/performed ?Overall Cognitive Status: Within Functional Limits for tasks assessed ?  ?  ?General Comments: Very motivated ?  ?  ? ?  ?Exercises General Exercises - Lower Extremity ?Ankle Circles/Pumps: AROM, Strengthening, Both, Seated ?Long Arc Quad: AROM, Strengthening, Both, 10 reps, Seated ?Heel Raises: AROM, Both, 5 reps, Seated ? ?  ?General Comments General comments (skin integrity, edema, etc.): VSS on RA ?Pt reports fatigue modified RPE rated 5/10  ?  ? ?Pertinent Vitals/Pain    ? ? ?Home Living   ?Prior Function   ? ?PT Goals (current goals can now be found in the care plan section) Acute Rehab PT Goals ?Patient Stated Goal: stop the dizziness ?PT Goal Formulation: With patient ?Time For Goal Achievement: 02/10/22 ?Potential to Achieve Goals: Good ?Progress towards PT goals: Progressing toward goals ? ?  ?Frequency ? ? ? Min 4X/week ? ? ? ?  ?PT Plan Current plan remains appropriate  ? ? ?  Co-evaluation PT/OT/SLP Co-Evaluation/Treatment: Yes ?Reason for Co-Treatment: To address functional/ADL transfers ?PT goals addressed during session: Mobility/safety with  mobility;Balance;Proper use of DME;Strengthening/ROM ?OT goals addressed during session: Strengthening/ROM ?  ? ?  ?AM-PAC PT "6 Clicks" Mobility   ?Outcome Measure ? Help needed turning from your back to your side while in a flat bed without using bedrails?: None ?Help needed moving from lying on your back to sitting on the side of a flat bed without using bedrails?: None ?Help needed moving to and from a bed to a chair (including a wheelchair)?: A Little ?Help needed standing up from a chair using your arms (e.g., wheelchair or bedside chair)?: A Little ?Help needed to walk in hospital room?: A Little ?Help needed climbing 3-5 steps with a railing? : A Lot ?6 Click Score: 19 ? ?  ?End of Session Equipment Utilized During Treatment: Gait belt ?Activity Tolerance: Patient tolerated treatment well ?Patient left: in chair;with call bell/phone within reach;with family/visitor present ?  ?PT Visit Diagnosis: Unsteadiness on feet (R26.81);Difficulty in walking, not elsewhere classified (R26.2);Dizziness and giddiness (R42);Muscle weakness (generalized) (M62.81) ?  ? ? ?Time: [id number] ?PT Time Calculation (min) (ACUTE ONLY): 30 min ? ?Charges:  $Gait Training: 8-22 mins ?$Therapeutic Exercise: 8-22 mins          ?          ? ?Wadie Lessen, SPTA ?Carly P., PTA ?Acute Rehabilitation Services ?Secure Chat Preferred 9a-5:30pm ?Office: [phone]  ? ?Tiffany Mutch ?01/29/2022, 12:28 PM ? ?

## 2022-01-29 NOTE — Progress Notes (Addendum)
? ? ?Referring Physician(s): ?Dr. Pearlean Brownie ? ?Supervising Physician: Julieanne Cotton ? ?Patient Status:  [facility] - In-pt ? ?Chief Complaint: ?L vertebral artery occlusion ? ?Subjective: ?Resting comfortably. Family at bedside.  ?Arm without swelling, erythema, or pain today.  ?No complaints.  ? ?Allergies: ?Patient has no known allergies. ? ?Medications: ?Prior to Admission medications   ?Medication Sig Start Date End Date Taking? Authorizing Provider  ?cetirizine (ZYRTEC ALLERGY) 10 MG tablet Take 1 tablet (10 mg total) by mouth daily. 11/05/19   Durward Parcel, FNP  ?Ferrous Sulfate (IRON SLOW RELEASE) 142 (45 Fe) MG TBCR Take 45 mg by mouth every other day.    [provider]  ?levothyroxine (SYNTHROID, LEVOTHROID) 88 MCG tablet Take 88 mcg by mouth daily before breakfast.    [provider]  ?meclizine (ANTIVERT) 25 MG tablet Take 1 tablet (25 mg total) by mouth 3 (three) times daily as needed for dizziness. 01/23/21   Couture, Cortni S, PA-C  ?naproxen sodium (ALEVE) 220 MG tablet Take 440 mg by mouth daily as needed (pain).    [provider]  ?omeprazole (PRILOSEC) 40 MG capsule Take 40 mg by mouth daily.    [provider]  ? ? ? ?Vital Signs: ?BP 105/80 (BP Location: Left Arm)   Pulse (!) 54   Temp 98.1 ?F (36.7 ?C) (Oral)   Resp 14   Ht 5\' 2"  (1.575 m)   Wt 153 lb 14.1 oz (69.8 kg)   LMP 04/14/2019   SpO2 99%   BMI 28.15 kg/m?  ? ?Physical Exam ?Vitals and nursing note reviewed.  ?NAD, resting in bed.  ?Skin:  R arm without erythema, warmth, swelling.  Area of noted swelling and redness yesterday has completely resolved today.  ROM intact.  Sensation intact.  Palpable radial pulse.  ? ?Imaging: ?CT Angio Head W or Wo Contrast ? ?Result Date: 01/26/2022 ?CLINICAL DATA:  Dizziness and nausea EXAM: CT ANGIOGRAPHY HEAD AND NECK TECHNIQUE: Multidetector CT imaging of the head and neck was performed using the standard protocol during bolus administration of intravenous  contrast. Multiplanar CT image reconstructions and MIPs were obtained to evaluate the vascular anatomy. Carotid stenosis measurements (when applicable) are obtained utilizing NASCET criteria, using the distal internal carotid diameter as the denominator. RADIATION DOSE REDUCTION: This exam was performed according to the departmental dose-optimization program which includes automated exposure control, adjustment of the mA and/or kV according to patient size and/or use of iterative reconstruction technique. CONTRAST:  29mL OMNIPAQUE IOHEXOL 350 MG/ML SOLN COMPARISON:  01/23/2021 FINDINGS: CTA NECK FINDINGS SKELETON: There is no bony spinal canal stenosis. No lytic or blastic lesion. OTHER NECK: Normal pharynx, larynx and major salivary glands. No cervical lymphadenopathy. Unremarkable thyroid gland. UPPER CHEST: No pneumothorax or pleural effusion. No nodules or masses. AORTIC ARCH: There is no calcific atherosclerosis of the aortic arch. There is no aneurysm, dissection or hemodynamically significant stenosis of the visualized portion of the aorta. Conventional 3 vessel aortic branching pattern. The visualized proximal subclavian arteries are widely patent. RIGHT CAROTID SYSTEM: Normal without aneurysm, dissection or stenosis. LEFT CAROTID SYSTEM: Normal without aneurysm, dissection or stenosis. VERTEBRAL ARTERIES: Codominant configuration. Long segment narrowing and multifocal complete loss of opacification of the left vertebral artery which is new since 01/25/2021 and likely indicates dissection. Right vertebral artery is normal. CTA HEAD FINDINGS POSTERIOR CIRCULATION: --Vertebral arteries: Limited opacification of the left V4 segment. Right is normal. --Inferior cerebellar arteries: Both PICA are opacified. --Basilar artery: Normal. --Superior cerebellar arteries: Normal. --Posterior  cerebral arteries (PCA): Normal. ANTERIOR CIRCULATION: --Intracranial internal carotid arteries: Normal. --Anterior cerebral  arteries (ACA): Normal. Both A1 segments are present. Patent anterior communicating artery (a-comm). --Middle cerebral arteries (MCA): Normal. VENOUS SINUSES: As permitted by contrast timing, patent. ANATOMIC VARIANTS: None Review of the MIP images confirms the above findings. IMPRESSION: 1. Long segment narrowing and multifocal complete loss of opacification of the left vertebral artery, new since 01/23/2021, consistent with dissection. Consultation for possible intervention is recommended. 2. Intracranial and cervical arteries otherwise normal. Critical Value/emergent results were called by telephone at the time of interpretation on 01/26/2022 at 9:32 pm to provider Mertha Baars , who verbally acknowledged these results. Electronically Signed   By: Deatra Robinson M.D.   On: 01/26/2022 21:33  ? ?CT ANGIO NECK W OR WO CONTRAST ? ?Result Date: 01/26/2022 ?CLINICAL DATA:  Dizziness and nausea EXAM: CT ANGIOGRAPHY HEAD AND NECK TECHNIQUE: Multidetector CT imaging of the head and neck was performed using the standard protocol during bolus administration of intravenous contrast. Multiplanar CT image reconstructions and MIPs were obtained to evaluate the vascular anatomy. Carotid stenosis measurements (when applicable) are obtained utilizing NASCET criteria, using the distal internal carotid diameter as the denominator. RADIATION DOSE REDUCTION: This exam was performed according to the departmental dose-optimization program which includes automated exposure control, adjustment of the mA and/or kV according to patient size and/or use of iterative reconstruction technique. CONTRAST:  39mL OMNIPAQUE IOHEXOL 350 MG/ML SOLN COMPARISON:  01/23/2021 FINDINGS: CTA NECK FINDINGS SKELETON: There is no bony spinal canal stenosis. No lytic or blastic lesion. OTHER NECK: Normal pharynx, larynx and major salivary glands. No cervical lymphadenopathy. Unremarkable thyroid gland. UPPER CHEST: No pneumothorax or pleural effusion. No nodules or  masses. AORTIC ARCH: There is no calcific atherosclerosis of the aortic arch. There is no aneurysm, dissection or hemodynamically significant stenosis of the visualized portion of the aorta. Conventional 3 vessel aortic branching pattern. The visualized proximal subclavian arteries are widely patent. RIGHT CAROTID SYSTEM: Normal without aneurysm, dissection or stenosis. LEFT CAROTID SYSTEM: Normal without aneurysm, dissection or stenosis. VERTEBRAL ARTERIES: Codominant configuration. Long segment narrowing and multifocal complete loss of opacification of the left vertebral artery which is new since 01/25/2021 and likely indicates dissection. Right vertebral artery is normal. CTA HEAD FINDINGS POSTERIOR CIRCULATION: --Vertebral arteries: Limited opacification of the left V4 segment. Right is normal. --Inferior cerebellar arteries: Both PICA are opacified. --Basilar artery: Normal. --Superior cerebellar arteries: Normal. --Posterior cerebral arteries (PCA): Normal. ANTERIOR CIRCULATION: --Intracranial internal carotid arteries: Normal. --Anterior cerebral arteries (ACA): Normal. Both A1 segments are present. Patent anterior communicating artery (a-comm). --Middle cerebral arteries (MCA): Normal. VENOUS SINUSES: As permitted by contrast timing, patent. ANATOMIC VARIANTS: None Review of the MIP images confirms the above findings. IMPRESSION: 1. Long segment narrowing and multifocal complete loss of opacification of the left vertebral artery, new since 01/23/2021, consistent with dissection. Consultation for possible intervention is recommended. 2. Intracranial and cervical arteries otherwise normal. Critical Value/emergent results were called by telephone at the time of interpretation on 01/26/2022 at 9:32 pm to provider Mertha Baars , who verbally acknowledged these results. Electronically Signed   By: Deatra Robinson M.D.   On: 01/26/2022 21:33  ? ?MR BRAIN WO CONTRAST ? ?Result Date: 01/27/2022 ?CLINICAL DATA:   56 year old female with dizziness since 1330 hours. Associated nausea and vomiting. Blurred vision. CTA suggests left vertebral artery dissection. EXAM: MRI HEAD WITHOUT CONTRAST TECHNIQUE: Multiplanar, multiecho pulse sequence

## 2022-01-29 NOTE — Progress Notes (Signed)
? ?Kristy Abbott  [id number] DOB: 1966-10-09 DOA: 01/26/2022 ?PCP: [name], MD   ? ?Brief Narrative:  ?56 year old with a history of anemia and Graves' disease who presented to the ER at [facility] with dizziness and was found on work-up to have a left vertebral artery occlusion with an acute cerebellar stroke.  She was transferred to [facility] for further evaluation and underwent angiography which ruled out vertebral artery dissection. ? ?Consultants:  ?Neuro interventional radiology ?Stroke team ? ?Code Status: FULL CODE ? ?DVT prophylaxis: ?SCDs ? ?Interim Hx: ?Afebrile.  Vital signs stable.  Resting comfortably in bed.  Has no new complaints.  Is hopeful she will be admitted to rehab soon.  Denies chest pain shortness of breath fevers or chills.  Feels her neurologic symptoms are stable. ? ?Assessment & Plan: ? 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  Currently on statin.  Hemoglobin A1c 5.2. ?PT OT consulted.  Recommend CIR. ?No antithrombotic prior to admission. ?Neurology recommends aspirin and Plavix for 3 months followed by aspirin alone and aggressive risk factor modification. ?I have informed CIR that patient medically ready to transition to CIR when bed is available. ? ?Headache ?Neck pain. ?Continue Tylenol and Flexeril. ?Much improved at this time ? ?Hyperlipidemia ?On Crestor 20 mg. ? ?GERD (gastroesophageal reflux disease) ?Continue PPI. ? ?Hypothyroidism ?Continue Synthroid. ? ? ?Family Communication: Spoke with patient and husband at bedside ?Disposition: Therapy has recommended CIR -patient medically stable for transfer to CIR as soon as bed available ? ?Objective: ?Blood pressure 105/80, pulse (!) 54, temperature 98.1  ?F (36.7 ?C), temperature source Oral, resp. rate 14, height 5\' 2"  (1.575 m), weight 69.8 kg, last menstrual period 04/14/2019, SpO2 99 %. ? ?Intake/Output Summary (Last 24 hours) at 01/29/2022 1052 ?Last data filed at 01/29/2022 03/31/2022 ?Gross per 24 hour  ?Intake 810 ml  ?Output --  ?Net 810 ml  ? ?Filed Weights  ? 01/26/22 1553 01/27/22 0137  ?Weight: 69.9 kg 69.8 kg  ? ? ?Examination: ?General: No acute respiratory distress ?Lungs: Clear to auscultation bilaterally without wheezes or crackles ?Cardiovascular: Regular rate and rhythm without murmur gallop or rub normal S1 and S2 ?Abdomen: Nontender, nondistended, soft, bowel sounds positive, no rebound, no ascites, no appreciable mass ?Extremities: No significant cyanosis, clubbing, or edema bilateral lower extremities ? ?CBC: ?Recent Labs  ?Lab 01/26/22 ?2305 01/27/22 ?03/29/22 01/28/22 ?0118 01/29/22 ?0159  ?WBC  --  4.7 4.4 3.9*  ?NEUTROABS 2.4  --   --   --   ?HGB  --  13.9 12.7 12.6  ?HCT  --  40.2 37.3 36.8  ?MCV  --  92.4 92.8 91.8  ?PLT  --  182 195 189  ? ?Basic Metabolic Panel: ?Recent Labs  ?Lab 01/26/22 ?1659 01/27/22 ?03/29/22  ?NA 139 141  ?K 3.8 3.7  ?CL 107 109  ?CO2 24 23  ?GLUCOSE 113* 88  ?BUN 16 13  ?CREATININE 0.67 0.60  ?CALCIUM 9.7 9.2  ?MG  --  2.1  ?PHOS  --  3.1  ? ?GFR: ?Estimated Creatinine Clearance: 74.5 mL/min (by C-G formula based on SCr of 0.6 mg/dL). ? ?Liver Function Tests: ?Recent Labs  ?Lab 01/27/22 ?03/29/22  ?AST 22  ?ALT 22  ?ALKPHOS 57  ?BILITOT 0.9  ?PROT 7.1  ?ALBUMIN  4.2  ? ? ?HbA1C: ?Hgb A1c MFr Bld  ?Date/Time Value Ref Range Status  ?01/27/2022 06:14 AM 4.9 4.8 - 5.6 % Final  ?  Comment:  ?  (NOTE) ?Pre diabetes:          5.7%-6.4% ? ?Diabetes:              >6.4% ? ?Glycemic control for   <7.0% ?adults with diabetes ?  ?01/23/2021 01:58 PM 5.2 4.8 - 5.6 % Final  ?  Comment:  ?  (NOTE) ?Pre diabetes:          5.7%-6.4% ? ?Diabetes:              >6.4% ? ?Glycemic control for   <7.0% ?adults with diabetes ?  ? ? ?Scheduled Meds: ?  aspirin EC  81 mg Oral Daily  ? clopidogrel  75 mg Oral Daily  ? cyclobenzaprine  5 mg Oral TID  ? ferrous sulfate  325 mg Oral QODAY  ? levothyroxine  88 mcg Oral QAC breakfast  ? pantoprazole  40 mg Oral Daily  ? rosuvastatin  20 mg Oral Daily  ? ? ? LOS: 3 days  ? ?Lonia Blood, MD ?[facility] ?Office  [phone] ?Pager - Text Page per Loretha Stapler ? ?If 7PM-7AM, please contact night-coverage per Amion ?01/29/2022, 10:52 AM ? ? ? ? ?

## 2022-01-29 NOTE — PMR Pre-admission (Shared)
PMR Admission Coordinator Pre-Admission Assessment ? ?Patient: Kristy Abbott is an 56 y.o., female ?MRN: [id number] ?DOB: [birth year] ?Height: 5' 2"  (157.5 cm) ?Weight: 69.8 kg ? ?Insurance Information ?HMO:     PPO: yes     PCP:      IPA:      80/20:      OTHER:  ?PRIMARY: [employer] Commercial      Policy#: [id number]      Subscriber: pt ?CM Name: [id number]       Phone#: ***     Fax#: *** ?Pre-Cert#: [id number]       Employer:  ?Benefits:  Phone #: ***     Name: *** ?Eff Date: 10/20/2021- 10/19/2022 ?Deductible: $3,000 ($0 met) ?OOP Max: $6,000 ($126.86 met) ?CIR: 80% coverage, 20% co-insurance ?SNF: 80% coverage, 20% co-insurance; limited to 100 days/cal yr ?Outpatient: 80% coverage, 20% co-insurance; limited to 20 visits/cal yr ?Home Health: 80% coverage, 20% co-insurance; limited to 120 visits/cal yr ?DME: 80% coverage, 20% co-insurance ?Providers: In network ?SECONDARY:       Policy#:      Phone#:  ? ? ?The ?Data Collection Information Summary? for patients in Inpatient Rehabilitation Facilities with attached ?Privacy Act [place] Records? was provided and verbally reviewed with: Patient ? ?Emergency Contact Information ?Contact Information   ? ? Name Relation Home Work Mobile  ? Sime,Tommy Spouse [phone]  [phone]  ? ?  ? ? ?Current Medical History  ?Patient Admitting Diagnosis: CVA ?History of Present Illness: Kristy Abbott is a 56 y.o. female with medical history significant for hypothyroidism, GERD, iron deficiency anemia who presents to the emergency department at [facility] 01/26/22 due to dizziness. She was transferred to [facility] and admitted for Vertebral artery occlusion. CT angiogram of head and neck showed left vertebral artery dissection ?MRI showed acute infarct in the left cerebellar vermis. Echocardiogram shows preserved EF without any significant valvular abnormality. Pt. Seen by PT/OT who recommend CIR to assist return to PLOF.  ? ?Complete NIHSS TOTAL:  2 ? ? ?Patient's medical record [facility] has been reviewed by the rehabilitation admission coordinator and physician. ? ?Past Medical History  ?Past Medical History:  ?Diagnosis Date  ? Anemia   ? Graves disease   ? Hypothyroidism   ? ? ?Has the patient had major surgery during 100 days prior to admission? Yes and No ? ?Family History   ?family history includes CAD in her mother; Diabetes in her mother. ? ?Current Medications ? ?Current Facility-Administered Medications:  ?  acetaminophen (TYLENOL) tablet 650 mg, 650 mg, Oral, Q6H PRN, 650 mg at 01/28/22 1549 **OR** acetaminophen (TYLENOL) suppository 650 mg, 650 mg, Rectal, Q6H PRN, Adefeso, Oladapo, DO ?  aspirin EC tablet 81 mg, 81 mg, Oral, Daily, Lavina Hamman, MD, 81 mg at 01/28/22 0843 ?  butalbital-acetaminophen-caffeine (FIORICET) 50-325-40 MG per tablet 1 tablet, 1 tablet, Oral, Q6H PRN, Lavina Hamman, MD ?  clopidogrel (PLAVIX) tablet 75 mg, 75 mg, Oral, Daily, Garvin Fila, MD, 75 mg at 01/28/22 0843 ?  cyclobenzaprine (FLEXERIL) tablet 5 mg, 5 mg, Oral, TID, Lavina Hamman, MD, 5 mg at 01/28/22 2136 ?  ferrous sulfate tablet 325 mg, 325 mg, Oral, QODAY, Adefeso, Oladapo, DO, 325 mg at 01/27/22 0907 ?  levothyroxine (SYNTHROID) tablet 88 mcg, 88 mcg, Oral, QAC breakfast, Adefeso, Oladapo, DO, 88 mcg at 01/29/22 0603 ?  meclizine (ANTIVERT) tablet 25 mg, 25 mg, Oral, TID PRN, Adefeso, Oladapo, DO ?  ondansetron (  ZOFRAN) injection 4 mg, 4 mg, Intravenous, Q6H PRN, Adefeso, Oladapo, DO ?  pantoprazole (PROTONIX) EC tablet 40 mg, 40 mg, Oral, Daily, Adefeso, Oladapo, DO, 40 mg at 01/28/22 0843 ?  rosuvastatin (CRESTOR) tablet 20 mg, 20 mg, Oral, Daily, Shafer, Devon, NP, 20 mg at 01/28/22 0843 ? ?Patients Current Diet:  ?Diet Order   ? ?       ?  Diet Heart Room service appropriate? Yes; Fluid consistency: Thin  Diet effective now       ?  ? ?  ?  ? ?  ? ? ?Precautions / Restrictions ?Precautions ?Precautions:  Fall ?Restrictions ?Weight Bearing Restrictions: No  ? ?Has the patient had 2 or more falls or a fall with injury in the past year? No ? ?Prior Activity Level ?Community (5-7x/wk): Pt. was working and active in the community PTA ? ?Prior Functional Level ?Self Care: Did the patient need help bathing, dressing, using the toilet or eating? Independent ? ?Indoor Mobility: Did the patient need assistance with walking from room to room (with or without device)? Independent ? ?Stairs: Did the patient need assistance with internal or external stairs (with or without device)? Independent ? ?Functional Cognition: Did the patient need help planning regular tasks such as shopping or remembering to take medications? Independent ? ?Patient Information ?Are you of Hispanic, Latino/a,or Spanish origin?: A. No, not of Hispanic, Latino/a, or Spanish origin ?What is your race?: A. White ?Do you need or want an interpreter to communicate with a doctor or health care staff?: 0. No ? ?Patient's Response To:  ?Health Literacy and Transportation ?Is the patient able to respond to health literacy and transportation needs?: No ?Health Literacy - How often do you need to have someone help you when you read instructions, pamphlets, or other written material from your doctor or pharmacy?: Never ?In the past 12 months, has lack of transportation kept you from medical appointments or from getting medications?: No ?In the past 12 months, has lack of transportation kept you from meetings, work, or from getting things needed for daily living?: No ? ?Home Assistive Devices / Equipment ?Home Assistive Devices/Equipment: None ?Home Equipment: None ? ?Prior Device Use: Indicate devices/aids used by the patient prior to current illness, exacerbation or injury? None of the above ? ?Current Functional Level ?Cognition ? Overall Cognitive Status: Within Functional Limits for tasks assessed ?Orientation Level: Oriented X4 ?General Comments: Very motivated ?    ?Extremity Assessment ?(includes Sensation/Coordination) ? Upper Extremity Assessment: LUE deficits/detail ?LUE Deficits / Details: Decreased strength grossly and at grasp. Reports decreased sensation. ?LUE Sensation: decreased light touch  ?Lower Extremity Assessment: Defer to PT evaluation ?LLE Deficits / Details: 4/5 grossly graded ?LLE Sensation: decreased light touch, decreased proprioception ?LLE Coordination: decreased gross motor, decreased fine motor  ?  ?ADLs ? Overall ADL's : Needs assistance/impaired ?Eating/Feeding: Set up, Sitting ?Grooming: Oral care, Min guard, Standing ?Upper Body Bathing: Min guard, Sitting ?Lower Body Bathing: Minimal assistance, Sit to/from stand ?Upper Body Dressing : Min guard, Sitting ?Lower Body Dressing: Moderate assistance, Sit to/from stand ?Toilet Transfer: Min guard, Ambulation, Rolling walker (2 wheels) (simulated to recliner) ?Functional mobility during ADLs: Min guard, Rolling walker (2 wheels) ?General ADL Comments: Pt presenting with consistent dizziness throughout. Very motivated and responding well to education  ?  ?Mobility ? Overal bed mobility: Needs Assistance ?Bed Mobility: Supine to Sit, Sit to Supine ?Supine to sit: Min guard ?Sit to supine: Min guard ?General bed mobility comments: Increased time  ?  ?  Transfers ? Overall transfer level: Needs assistance ?Equipment used: Rolling walker (2 wheels) ?Transfers: Sit to/from Stand ?Sit to Stand: Min guard ?General transfer comment: Min guard A for safety  ?  ?Ambulation / Gait / Stairs / Wheelchair Mobility ? Ambulation/Gait ?Ambulation/Gait assistance: Min assist ?Gait Distance (Feet): 15 Feet (x 2 (to/from bathroom)) ?Assistive device: IV Pole, 1 person hand held assist ?Gait Pattern/deviations: Step-through pattern, Decreased stride length, Shuffle ?General Gait Details: short, shuffle steps; hand-held assist L, pushing IV pole with R ?Gait velocity: very slow cadence due to dizziness ?Gait velocity  interpretation: <1.8 ft/sec, indicate of risk for recurrent falls  ?  ?Posture / Balance Balance ?Overall balance assessment: Needs assistance ?Sitting-balance support: Feet supported, No upper extremity supported ?Sitting balanc

## 2022-01-29 NOTE — Progress Notes (Signed)
Occupational Therapy Treatment ?Patient Details ?Name: Kristy Abbott ?MRN: [id number] ?DOB: 08/30/1966 ?Today's Date: 01/29/2022 ? ? ?History of present illness 56 y.o. female admitted 4/9 with c/o dizziness, N&V. 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 PMH:  hypothyroidism, GERD, iron deficiency anemia ?  ?OT comments ? Pt making steady progress towards OT goals this session. Pt continues to present with decreased activity tolerance, decreased sensation in LUE/LLE and double vision.pt currently requires min guard for functional ADL transfer with RW and min guard for standing ADLS at sink. Pt would continue to benefit from skilled occupational therapy while admitted and after d/c to address the below listed limitations in order to improve overall functional mobility and facilitate independence with BADL participation. DC plan remains appropriate, will follow acutely per POC.  ?  ?  ? ?Recommendations for follow up therapy are one component of a multi-disciplinary discharge planning process, led by the attending physician.  Recommendations may be updated based on patient status, additional functional criteria and insurance authorization. ?   ?Follow Up Recommendations ? Acute inpatient rehab (3hours/day)  ?  ?Assistance Recommended at Discharge    ?Patient can return home with the following ?   ?  ?Equipment Recommendations ? BSC/3in1  ?  ?Recommendations for Other Services   ? ?  ?Precautions / Restrictions Precautions ?Precautions: Fall ?Restrictions ?Weight Bearing Restrictions: No  ? ? ?  ? ?Mobility Bed Mobility ?Overal bed mobility: Needs Assistance ?Bed Mobility: Sit to Supine ?  ?  ?  ?Sit to supine: Supervision ?  ?General bed mobility comments: Increased time ?  ? ?Transfers ?Overall transfer level: Needs assistance ?Equipment used: Rolling walker (2 wheels) ?Transfers: Sit to/from Stand ?Sit to Stand: Min guard ?  ?  ?  ?  ?  ?General transfer comment:  Min guard A for safety ?  ?  ?Balance Overall balance assessment: Modified Independent ?Sitting-balance support: Feet supported, No upper extremity supported ?Sitting balance-Leahy Scale: Good ?  ?  ?Standing balance support: No upper extremity supported, During functional activity ?Standing balance-Leahy Scale: Fair ?  ?  ?  ?  ?  ?  ?  ?  ?  ?  ?  ?  ?   ? ?ADL either performed or assessed with clinical judgement  ? ?ADL Overall ADL's : Needs assistance/impaired ?  ?  ?Grooming: Wash/dry hands;Standing;Min guard ?  ?  ?  ?  ?  ?  ?  ?  ?  ?Toilet Transfer: Min guard;Ambulation;Rolling walker (2 wheels) ?  ?Toileting- Architect and Hygiene: Min guard;Sit to/from stand ?  ?  ?  ?Functional mobility during ADLs: Min guard;Rolling walker (2 wheels) ?General ADL Comments: mild dizziness reported initially during sit>stand, pt continues to present with intermittent double vision but declined to wear occlusion glasses ?  ? ?Extremity/Trunk Assessment Upper Extremity Assessment ?Upper Extremity Assessment: LUE deficits/detail ?LUE Sensation: decreased light touch ?  ?Lower Extremity Assessment ?Lower Extremity Assessment: Defer to PT evaluation ?  ?Cervical / Trunk Assessment ?Cervical / Trunk Assessment: Normal ?  ? ?Vision Baseline Vision/History: 1 Wears glasses ?Ability to See in Adequate Light: 0 Adequate ?Patient Visual Report: Diplopia;Other (comment) (continues to report intermittent diplopia however pt declined wearing occlusion glasses) ?  ?  ?Perception Perception ?Perception: Not tested ?  ?Praxis Praxis ?Praxis: Not tested ?  ? ?Cognition Arousal/Alertness: Awake/alert ?Behavior During Therapy: [facility] for tasks assessed/performed ?Overall Cognitive Status: Within Functional Limits for tasks assessed ?  ?  ?  ?  ?  ?  ?  ?  ?  ?  ?  ?  ?  ?  ?  ?  ?  General Comments: Very motivated ?  ?  ?   ?Exercises   ? ?  ?Shoulder Instructions   ? ? ?  ?General Comments VSS on RA  ? ? ?Pertinent Vitals/ Pain        Pain Assessment ?Pain Assessment: No/denies pain ? ?Home Living   ?  ?  ?  ?  ?  ?  ?  ?  ?  ?  ?  ?  ?  ?  ?  ?  ?  ?  ? ?  ?Prior Functioning/Environment    ?  ?  ?  ?   ? ?Frequency ? Min 2X/week  ? ? ? ? ?  ?Progress Toward Goals ? ?OT Goals(current goals can now be found in the care plan section) ? Progress towards OT goals: Progressing toward goals ? ?Acute Rehab OT Goals ?Patient Stated Goal: to go to rehab ?OT Goal Formulation: With patient ?Time For Goal Achievement: 02/10/22 ?Potential to Achieve Goals: Good  ?Plan Discharge plan remains appropriate;Frequency remains appropriate   ? ?Co-evaluation ? ? ?   ?  ?  ?  ?  ? ?  ?AM-PAC OT "6 Clicks" Daily Activity     ?Outcome Measure ? ? Help from another person eating meals?: None ?Help from another person taking care of personal grooming?: None ?Help from another person toileting, which includes using toliet, bedpan, or urinal?: A Little ?Help from another person bathing (including washing, rinsing, drying)?: A Little ?Help from another person to put on and taking off regular upper body clothing?: None ?Help from another person to put on and taking off regular lower body clothing?: A Little ?6 Click Score: 21 ? ?  ?End of Session Equipment Utilized During Treatment: Gait belt;Rolling walker (2 wheels) ? ?OT Visit Diagnosis: Unsteadiness on feet (R26.81);Other abnormalities of gait and mobility (R26.89);Muscle weakness (generalized) (M62.81) ?  ?Activity Tolerance Patient tolerated treatment well ?  ?Patient Left in bed;with call bell/phone within reach;with bed alarm set;with family/visitor present ?  ?Nurse Communication Mobility status ?  ? ?   ? ?Time: 1884-1660 ?OT Time Calculation (min): 17 min ? ?Charges: OT General Charges ?$OT Visit: 1 Visit ?OT Treatments ?$Self Care/Home Management : 8-22 mins ?Pollyann Glen K., COTA/L ?Acute Rehabilitation Services ?[phone] ? ? ?Kristy Abbott ?01/29/2022, 1:06 PM ?

## 2022-01-30 DIAGNOSIS — I63219 Cerebral infarction due to unspecified occlusion or stenosis of unspecified vertebral arteries: Secondary | ICD-10-CM

## 2022-01-30 MED ORDER — ASPIRIN 81 MG PO TBEC: 81.0000 mg | DELAYED_RELEASE_TABLET | Freq: Every day | ORAL | 11 refills | Status: DC

## 2022-01-30 MED ORDER — ROSUVASTATIN CALCIUM 20 MG PO TABS: 20.0000 mg | ORAL_TABLET | Freq: Every day | ORAL | 1 refills | Status: AC

## 2022-01-30 MED ORDER — CLOPIDOGREL BISULFATE 75 MG PO TABS: 75.0000 mg | ORAL_TABLET | Freq: Every day | ORAL | 2 refills | Status: AC

## 2022-01-30 MED ORDER — ACETAMINOPHEN 325 MG PO TABS: 650.0000 mg | ORAL_TABLET | Freq: Four times a day (QID) | ORAL | Status: AC | PRN

## 2022-01-30 NOTE — Discharge Summary (Signed)
?DISCHARGE SUMMARY ? ?Kristy Abbott ? ?MR#: [id number] ? ?DOB:December 26, 1965  ?Date of Admission: 01/26/2022 ?Date of Discharge: 01/30/2022 ? ?Attending Physician:Bryden Darden Silvestre Gunner, MD ? ?Patient's Kristy Abbott, Kristy Fetters, MD ? ?Consults: Neurology  ? ?Disposition: D/C home [employer] company refused to cover CIR) ? ?Follow-up Appts: ? Follow-up Information   ? ? [name], MD Follow up in 1 week(s).   ?Specialty: Internal Medicine ?Contact information: ?[address]  ?[place] [address] ?[phone] ? ? ?  ?  ? ?  ?  ? ?  ? ? ?Tests Needing Follow-up: ?-determine how patient is progressing w/ PT/OT to decide when she is clear to return to work  ?-assure patient stops Plavix in 3 months and continues ASA alone  ? 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 ?Initial presentation: ?56 year old with a history of anemia and Graves' disease who presented to the ER at [facility] with dizziness and was found on work-up to have a left vertebral artery occlusion with an acute cerebellar stroke.  She was transferred to [facility] for further evaluation and underwent angiography which ruled out vertebral artery dissection. ? ?Hospital Course: ? 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  Currently on statin.  Hemoglobin A1c 5.2. ?PT OT consulted.  Recommend CIR but her insurance company refused to pay for it. Pt opted to go home w/ outpt PT/OT due to this refusal.  ?No antithrombotic prior to admission. ?Neurology recommends aspirin and Plavix for 3 months followed by aspirin alone and aggressive risk factor modification. ?  ?Headache ?Was transient  during this admission. Resolved w/ use of Tylenol and Flexeril. ?  ?Hyperlipidemia ?On Crestor 20 mg ?  ?GERD  ?Continue PPI ?  ?Hypothyroidism ?Continue Synthroid ?  ?Allergies as of 01/30/2022   ?No Known Allergies ?  ? ?  ?Medication List  ?  ? ?STOP taking these medications   ? ?naproxen sodium 220 MG tablet ?Commonly known as: ALEVE ?  ? ?  ? ?TAKE these medications   ? ?acetaminophen 325 MG tablet ?Commonly known as: TYLENOL ?Take 2 tablets (650 mg total) by mouth every 6 (six) hours as needed for mild pain (or Fever >/= 101). ?  ?aspirin 81 MG EC tablet ?Take 1 tablet (81 mg total) by mouth daily. Swallow whole. ?Start taking on: January 31, 2022 ?  ?cetirizine 10 MG tablet ?Commonly known as: ZyrTEC Allergy ?Take 1 tablet (10 mg total) by mouth daily. ?  ?clopidogrel 75 MG tablet ?Commonly known as: PLAVIX ?Take 1 tablet (75 mg total) by mouth daily. ?Start taking on: January 31, 2022 ?  ?Iron Slow Release 142 (45 Fe) MG Tbcr ?Generic drug: Ferrous Sulfate ?Take 45 mg by mouth every other day. ?  ?levothyroxine 88 MCG tablet ?Commonly known as: SYNTHROID ?Take 88 mcg by mouth daily before breakfast. ?  ?meclizine 25 MG tablet ?Commonly known as: ANTIVERT ?Take 1 tablet (25 mg total) by mouth 3 (three) times daily as needed for dizziness. ?  ?omeprazole 40 MG capsule ?Commonly known as: PRILOSEC ?Take 40 mg by mouth daily. ?  ?rosuvastatin 20 MG tablet ?Commonly known as: CRESTOR ?Take 1 tablet (20 mg total) by mouth daily. ?Start taking on: January 31, 2022 ?  ? ?  ? ? ?  Day of Discharge ?BP 132/77 (BP Location: Left Arm)   Pulse (!) 52   Temp 97.8 ?F (36.6 ?C) (Oral)   Resp 19   Ht 5\' 2"  (1.575 m)   Wt 69.8 kg   LMP 04/14/2019   SpO2 97%   BMI 28.15 kg/m?  ? ?Physical Exam: ?General: No acute respiratory distress ?Lungs: Clear to auscultation bilaterally without wheezes or crackles ?Cardiovascular: Regular rate and rhythm without murmur gallop or rub normal S1 and S2 ?Abdomen: Nontender, nondistended, soft,  bowel sounds positive, no rebound, no ascites, no appreciable mass ?Extremities: No significant cyanosis, clubbing, or edema bilateral lower extremities ? ?Basic Metabolic Panel: ?Recent Labs  ?Lab 01/26/22 ?1659 01/27/22 ?03/29/22  ?NA 139 141  ?K 3.8 3.7  ?CL 107 109  ?CO2 24 23  ?GLUCOSE 113* 88  ?BUN 16 13  ?CREATININE 0.67 0.60  ?CALCIUM 9.7 9.2  ?MG  --  2.1  ?PHOS  --  3.1  ? ? ?Liver Function Tests: ?Recent Labs  ?Lab 01/27/22 ?03/29/22  ?AST 22  ?ALT 22  ?ALKPHOS 57  ?BILITOT 0.9  ?PROT 7.1  ?ALBUMIN 4.2  ? ?CBC: ?Recent Labs  ?Lab 01/26/22 ?1659 01/26/22 ?2305 01/27/22 ?03/29/22 01/28/22 ?0118 01/29/22 ?0159  ?WBC 5.9  --  4.7 4.4 3.9*  ?NEUTROABS  --  2.4  --   --   --   ?HGB 13.9  --  13.9 12.7 12.6  ?HCT 41.8  --  40.2 37.3 36.8  ?MCV 91.1  --  92.4 92.8 91.8  ?PLT 224  --  182 195 189  ? ? ?Time spent in discharge (includes decision making & examination of pt): ?35 minutes ? ?01/30/2022, 5:46 PM  ? ?02/01/2022, MD ?[facility] ?Office  [phone] ? ? ? ? ? ?

## 2022-01-30 NOTE — Progress Notes (Signed)
IP rehab admissions - I have received an initial denial from [facility] with an offer of peer to peer for attending MD to talk with medical director.  I have let Dr. Sharon Seller know of this.  I will follow up with patient and keep her in the loop for information.  Call for questions.  [phone] ?

## 2022-01-30 NOTE — Progress Notes (Signed)
Mobility Specialist Progress Note ? ? 01/30/22 1520  ?Mobility  ?Activity Ambulated independently in hallway  ?Level of Assistance Independent after set-up  ?Assistive Device None  ?Distance Ambulated (ft) 470 ft  ?Activity Response Tolerated well  ?$Mobility charge 1 Mobility  ? ?Pre Mobility: 72 HR ?During Mobility: 85 HR, BP, 99% SpO2 ?Post Mobility: 65 HR, 134/72 BP ? ?Received in bed w/o complaints and agreeable. Initially having some numbness in R lower leg but pt stating she was just laying on it to long. Returned back to bed w/o fault and call bell in reach.  ? ?Kristy Abbott ?Mobility Specialist ?Phone Number [phone] ? ?

## 2022-01-30 NOTE — Progress Notes (Signed)
IP rehab admissions - I met with patient and her spouse at the bedside.  Both are in agreement that they want to discharge directly home and not appeal the decision to admit to CIR.  Noted PT now recommending outpatient therapy follow up.  Will sign off at this time for inpatient rehab admissions.  Call for questions.  [phone] ?

## 2022-01-30 NOTE — Progress Notes (Addendum)
Physical Therapy Treatment ?Patient Details ?Name: Kristy Abbott ?MRN: [id number] ?DOB: Feb 24, 1966 ?Today's Date: 01/30/2022 ? ? ?History of Present Illness 56 y.o. female admitted 4/9 with c/o dizziness, N&V. 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 PMH:  hypothyroidism, GERD, iron deficiency anemia ? ?  ?PT Comments  ? ? Received patient supine in bed. Pt was awake and alert and agreeable and motivated to participate in today's physical therapy session. Pt scores a 19/24 on DGI test which is indicative of fall risk in older community living adults, therefore, recommend outpatient rehab to increase balance and stability as pt plans to return to work. Discussed disposition change with supervising PT Anessa P and pt/PT agreeable to update as pt progressing well no longer appropriate for AIR level therapies. Plan to continue to follow pt acutely, she is making good progress toward PT goals. ?  ?Recommendations for follow up therapy are one component of a multi-disciplinary discharge planning process, led by the attending physician.  Recommendations may be updated based on patient status, additional functional criteria and insurance authorization. ? ?Follow Up Recommendations ? Outpatient PT ?  ?  ?Assistance Recommended at Discharge PRN  ?Patient can return home with the following A little help with walking and/or transfers;Help with stairs or ramp for entrance ?  ?Equipment Recommendations ? None recommended by PT  ?  ?Recommendations for Other Services   ? ? ?  ?Precautions / Restrictions Precautions ?Precautions: Fall ?Restrictions ?Weight Bearing Restrictions: No  ?  ? ?Mobility ? Bed Mobility ?Overal bed mobility: Independent ?Bed Mobility: Supine to Sit, Sit to Supine ?  ?  ?Supine to sit: Independent ?Sit to supine: Independent ?  ?  ?Transfers ?Overall transfer level: Modified independent ?Equipment used: None ?Transfers: Sit to/from Stand ?Sit to Stand:  Modified independent (Device/Increase time) ?   ?  ?  ? ?Ambulation/Gait ?Ambulation/Gait assistance: Min guard, Supervision ?Gait Distance (Feet): 475 Feet (Walked 74ft with cane, 433ft without cane) ?Assistive device: Straight cane, None ?Gait Pattern/deviations: Step-through pattern, Decreased stride length, Narrow base of support ?Gait velocity: increased cadence ?  ?  ?General Gait Details: short step-through steps, increased speed; initially min guard using cane but pt progressed to Supervision with no AD, no LOB. ? ? ?Stairs ?Stairs: Yes ?Stairs assistance: Supervision ?Stair Management: One rail Right ?Number of Stairs: 12 ?General stair comments: step to pattern using right leg to lead going up the stairs and left foot going down the stairs for stability. ?  ?Balance Overall balance assessment: Modified Independent ?Sitting-balance support: Feet supported, No upper extremity supported ?Sitting balance-Leahy Scale: Good ?  ?  ?Standing balance support: No upper extremity supported, During functional activity ?Standing balance-Leahy Scale: Good ?  ?  ?  ?  ?Standardized Balance Assessment ?Standardized Balance Assessment : Dynamic Gait Index ?  ?Dynamic Gait Index ?Level Surface: Normal ?Change in Gait Speed: Mild Impairment ?Gait with Horizontal Head Turns: Normal ?Gait with Vertical Head Turns: Normal ?Gait and Pivot Turn: Mild Impairment ?Step Over Obstacle: Mild Impairment ?Step Around Obstacles: Normal ?Steps: Moderate Impairment ?Total Score: 19 ?  ? ?  ?Cognition Arousal/Alertness: Awake/alert ?Behavior During Therapy: [facility] for tasks assessed/performed ?Overall Cognitive Status: Within Functional Limits for tasks assessed ?  ?  ?  ?  ?General Comments: Very motivated ?  ?  ? ?  ?Exercises General Exercises - Lower Extremity ?Ankle Circles/Pumps: AROM, Strengthening, Both, 5 reps, Seated ?Long Arc Quad: AROM, Strengthening, Both, Seated, 5 reps ?  Heel Slides: AROM, Strengthening, Both, Seated, 5  reps ?Other Exercises ?Other Exercises: sit to stand 5x hands on knees ?Other Exercises: providing HEP for strengthening ? ?  ?   ? ?Pertinent Vitals/Pain Pain Assessment ?Pain Assessment: No/denies pain  ? ? ? ?PT Goals (current goals can now be found in the care plan section) Acute Rehab PT Goals ?Patient Stated Goal: to go home and get back to work ?PT Goal Formulation: With patient ?Time For Goal Achievement: 02/10/22 ?Potential to Achieve Goals: Good ?Progress towards PT goals: Progressing toward goals ? ?  ?Frequency ? ? ? Min 4X/week ? ? ? ?  ?PT Plan Discharge plan needs to be updated;Equipment recommendations need to be updated  ? ? ?AM-PAC PT "6 Clicks" Mobility   ?Outcome Measure ? Help needed turning from your back to your side while in a flat bed without using bedrails?: None ?Help needed moving from lying on your back to sitting on the side of a flat bed without using bedrails?: None ?Help needed moving to and from a bed to a chair (including a wheelchair)?: None ?Help needed standing up from a chair using your arms (e.g., wheelchair or bedside chair)?: None ?Help needed to walk in hospital room?: A Little ?Help needed climbing 3-5 steps with a railing? : A Little ?6 Click Score: 22 ? ?  ?End of Session Equipment Utilized During Treatment: Gait belt ?Activity Tolerance: Patient tolerated treatment well ?Patient left: in bed;with family/visitor present;with call bell/phone within reach ?Nurse Communication: Mobility status ?PT Visit Diagnosis: Unsteadiness on feet (R26.81);Difficulty in walking, not elsewhere classified (R26.2);Dizziness and giddiness (R42);Muscle weakness (generalized) (M62.81) ?  ? ? ?Time: 7616-0737 ?PT Time Calculation (min) (ACUTE ONLY): 15 min ? ?Charges:  $Gait Training: 8-22 mins          ?          ? ?Otilio Jefferson, SPTA ? ?Tiffany Mutch ?01/30/2022, 1:11 PM ? ?

## 2022-01-31 MED FILL — Fentanyl Citrate Preservative Free (PF) Inj 100 MCG/2ML: INTRAMUSCULAR | Qty: 1 | Status: AC

## 2022-02-05 ENCOUNTER — Encounter: Payer: Self-pay | Admitting: Occupational Therapy

## 2022-02-05 ENCOUNTER — Ambulatory Visit: Payer: 59 | Attending: Internal Medicine | Admitting: Physical Therapy

## 2022-02-05 ENCOUNTER — Ambulatory Visit: Payer: 59 | Admitting: Occupational Therapy

## 2022-02-05 DIAGNOSIS — M6281 Muscle weakness (generalized): Secondary | ICD-10-CM | POA: Insufficient documentation

## 2022-02-05 DIAGNOSIS — R2689 Other abnormalities of gait and mobility: Secondary | ICD-10-CM | POA: Insufficient documentation

## 2022-02-05 DIAGNOSIS — R278 Other lack of coordination: Secondary | ICD-10-CM

## 2022-02-05 DIAGNOSIS — I7774 Dissection of vertebral artery: Secondary | ICD-10-CM | POA: Diagnosis not present

## 2022-02-05 DIAGNOSIS — R208 Other disturbances of skin sensation: Secondary | ICD-10-CM | POA: Diagnosis present

## 2022-02-05 DIAGNOSIS — M25512 Pain in left shoulder: Secondary | ICD-10-CM | POA: Insufficient documentation

## 2022-02-05 DIAGNOSIS — M25612 Stiffness of left shoulder, not elsewhere classified: Secondary | ICD-10-CM

## 2022-02-05 DIAGNOSIS — R2681 Unsteadiness on feet: Secondary | ICD-10-CM | POA: Diagnosis present

## 2022-02-05 NOTE — Therapy (Signed)
?OUTPATIENT OCCUPATIONAL THERAPY NEURO EVALUATION ? ?Patient Name: Kristy MaidensCarla D Porrata ?MRN: [id number] ?DOB:09/27/1966, 56 y.o., female ?Today's Date: 02/05/2022 ? ?PCP: Kirstie PeriShah, Ashish, MD ?REFERRING PROVIDER: Kirstie PeriShah, Ashish, MD ? ? OT End of Session - 02/05/22 1213   ? ? Visit Number 1   ? Number of Visits 21   ? Date for OT Re-Evaluation 04/16/22   ? Authorization Type [employer] - VL [id number]   ? Authorization - Visit Number 1   ? Authorization - Number of Visits 30   ? OT Start Time 1015   ? OT Stop Time 1100   ? OT Time Calculation (min) 45 min   ? Activity Tolerance Patient tolerated treatment well;Patient limited by pain   ? Behavior [facility] for tasks assessed/performed   ? ?  ?  ? ?  ? ? ?Past Medical History:  ?Diagnosis Date  ? Anemia   ? Graves disease   ? Hypothyroidism   ? ?Past Surgical History:  ?Procedure Laterality Date  ? CESAREAN SECTION    ? COLONOSCOPY N/A 11/10/2018  ? Procedure: COLONOSCOPY;  Surgeon: West BaliFields, Sandi L, MD;  [facility];  Service: Endoscopy;  Laterality: N/A;  1:30  ? Cyst removed from neck    ? IR ANGIO INTRA EXTRACRAN SEL COM CAROTID INNOMINATE BILAT MOD SED  01/28/2022  ? IR ANGIO VERTEBRAL SEL SUBCLAVIAN INNOMINATE UNI L MOD SED  01/28/2022  ? IR ANGIO VERTEBRAL SEL VERTEBRAL UNI R MOD SED  01/28/2022  ? IR US GUIDE VASC ACCESS RIGHT  01/28/2022  ? TOTAL ABDOMINAL HYSTERECTOMY  04/07/2019  ? Dr. Ernestina PennaNigel Buist  ? ?Patient Active Problem List  ? Diagnosis Date Noted  ? Neck pain 01/28/2022  ? Hyperlipidemia 01/28/2022  ? Headache 01/27/2022  ? Hypothyroidism 01/27/2022  ? GERD (gastroesophageal reflux disease) 01/27/2022  ? Dizziness 01/27/2022  ? CVA (cerebral vascular accident) (HCC) 01/27/2022  ? 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 Special screening for malignant neoplasms, colon   ? ? ?ONSET DATE: 01/26/22 ? ?REFERRING DIAG: I77.74 (ICD-10-CM) - Vertebral artery dissection (HCC) w/ cerebellar stroke affecting Lt side ? ?THERAPY DIAG:  ?Acute pain of left  shoulder ? ?Stiffness of left shoulder, not elsewhere classified ? ?Muscle weakness (generalized) ? ?Other lack of coordination ? ?Unsteadiness on feet ? ?Other disturbances of skin sensation ? ?SUBJECTIVE:  ? ?SUBJECTIVE STATEMENT: ?The pain and numbness on my Lt side is aggravating ?Pt accompanied by: self ? ?PERTINENT HISTORY: history of anemia and Graves' disease who [facility] with [facility] and was found on work-up to have a left vertebral artery occlusion with an acute cerebellar stroke.  PMH: Hyperlipidemia, GERD (gastroesophageal reflux disease) ?Hypothyroidism ? ?PRECAUTIONS: Other: NO driving, no lifting > 5 lbs ? ?WEIGHT BEARING RESTRICTIONS No ? ?PAIN:  ?Are you having pain? Yes: NPRS scale: 6/10 ?Pain location: neck and Lt shoulder ?Pain description: sharp at times, dull/achy constant ?Aggravating factors: nothing ?Relieving factors: heat, OTC meds ? ?FALLS: Has patient fallen in last 6 months? No ? ?LIVING ENVIRONMENT: ?Lives with: lives with their spouse ?Lives in: House/apartment ?Stairs: Yes: External: 4 steps; on right going up ?Has following equipment at home: Quad cane small base ? 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 ?PATIENT GOALS  get my Lt arm stronger ? ?OBJECTIVE:  ? ?HAND DOMINANCE: Right ? ?ADLs: ?Eating: needs assist cutting food ?Grooming: mod I ?UB Dressing: mod  I ?LB Dressing: set up, mod I leaving shoes tied ?Toileting: mod I (cane for ambulating) ?Bathing: standing in shower, mod I bathing w/ grab bar ?Tub Shower transfers: min assist ?Equipment:  grab bar ? ? ?IADLs: ?Shopping: pt going with husband ?Light housekeeping: currently dependent (husband always did laundry and dishwashing) ?Meal Prep: dependent for cooking (pt/husband did together prior to stroke) ?Community mobility: relies on husband for transportation ?Medication management:  independent ?Financial management: independent w/ phone ?Handwriting:  no changes w/ Rt unaffected hand ? ?MOBILITY STATUS: Independent, occasionally uses cane when sitting for longer periods d/t dizziness when first getting up ? ?POSTURE COMMENTS:  ?No Significant postural limitations ? ?ACTIVITY TOLERANCE: ?Activity tolerance: pt reports fatigues easier, goes to bed around 7 pm ? ? ?UE ROM   RUE: AROM WNLs.  ?LUE elbow, forearm and wrist WFL's. Finger flexion 75%, finger ext WFL's ? ?Active ROM Right ?02/05/2022 Left ?02/05/2022  ?Shoulder flexion  90, 6/10 pain  ?Shoulder abduction  80, 6/10 pain  ?Shoulder adduction    ?Shoulder extension    ?Shoulder internal rotation  75%  ?Shoulder external rotation  75%  ?Elbow flexion    ?Elbow extension    ?Wrist flexion    ?Wrist extension    ?Wrist ulnar deviation    ?Wrist radial deviation    ?Wrist pronation    ?Wrist supination    ?(Blank rows = not tested) ? ? ? ?HAND FUNCTION: ?Grip strength: Right: 66.1 lbs; Left: 24.4 lbs ? ?COORDINATION: ?9 Hole Peg test: Right: 18.72 sec; Left: 34.87 sec (slower d/t shoulder pain) ?Box and Blocks: Left 19 blocks (limited by shoulder pain 6/10) ? ?SENSATION: ?Slightly decreased sensation Lt hand but intact for light touch and localization. Pt reports numbness/tingling Lt hand ? ?EDEMA: none ? ?MUSCLE TONE: LUE: Mild and Hypertonic ? ?COGNITION: ?Overall cognitive status: Within functional limits for tasks assessed ? ?VISION: ?Baseline vision: Wears glasses all the time ?Visual history:  none ? ?VISION ASSESSMENT: ?Not tested, diplopia resolved after stroke ? ? ?PERCEPTION: Not tested ? ?PRAXIS: Not tested ? ?OBSERVATIONS: pain in neck and shoulder most limiting ? ? ?TODAY'S TREATMENT:  ?N/A ? ? ?PATIENT EDUCATION: ?Education details: OT POC and goals ?Person educated: Patient ?Education method: Explanation ?Education comprehension: verbalized understanding ? ? ?HOME EXERCISE PROGRAM: ?N/A today ? ? ? ?GOALS: ?Goals reviewed with  patient? Yes ? ?SHORT TERM GOALS: Target date: 03/05/2022 ? ?Pt will be independent with HEP targeting Lt shoulder pain and ROM ? ?Baseline: ?Goal status: INITIAL ? ?2.  Pt to cut food mod I w/ A/E prn ?Baseline:  ?Goal status: INITIAL ? ?3.  Pt to simulate tub transfers safely w/ DME prn and close sup ?Baseline:  ?Goal status: INITIAL ? ?4.  Pt to improve functional use of LUE by performing 28 or greater on Box & Blocks ?Baseline: 19 ?Goal status: INITIAL ? ?5.  Pt to improve grip strength Lt hand to 35 lbs or greater for opening jars/containers ?Baseline: 24.4 lbs (Rt = 66 lbs) ?Goal status: INITIAL ? ? ?LONG TERM GOALS: Target date: 04/16/2022 ? ?Independent with HEP for grip strength and coordination LT hand ?Baseline:  ?Goal status: INITIAL ? ?2.  Pt to return to cooking simple meal at distant supervision  ?Baseline: dependent  ?Goal status: INITIAL ? ?3.  Pt able to pick up and care for puppy using BUE's safely ?Baseline: unable ?Goal status: INITIAL ? ?4.  Pt to improve coordination as evidenced by performing 9 hole peg test in 28  sec or less ?Baseline: 34 sec (Rt = 18 sec) ?Goal status: INITIAL ? ?5.  Pt to improve LUE shoulder ROM by reaching into high shelf for light weight object w/ pain less than or equal to 3/10 ?Baseline: approx 90* w/ pain 6/10 ?Goal status: INITIAL ? ?6.  Pt to demo 90% shoulder movement for all functional tasks w/ pain 3/10 or under ?Baseline:  ?Goal status: INITIAL ? ?ASSESSMENT: ? ?CLINICAL IMPRESSION: ?Patient is a 56 y.o. female who was seen today for occupational therapy evaluation s/p vertebral artery occlusion and Lt cerebellar stroke on 01/26/22 w/ residual LUE deficits in ROM, coordination, strength, and pain. Pt also with mild balance deficits. Pt would benefit from O.T. to address these deficits and return to PLOF.  ? ?PERFORMANCE DEFICITS in functional skills including ADLs, IADLs, coordination, sensation, ROM, strength, pain, FMC, mobility, body mechanics, decreased  knowledge of use of DME, and UE functional use ? ?IMPAIRMENTS are limiting patient from ADLs, IADLs, rest and sleep, work, leisure, and social participation.  ? ?COMORBIDITIES may have co-morbidities  that affects occupational p

## 2022-02-05 NOTE — Therapy (Signed)
?OUTPATIENT PHYSICAL THERAPY NEURO EVALUATION ? ? ?Patient Name: Kristy Abbott ?MRN: [id number] ?DOB:May 21, 1966, 56 y.o., female ?Today's Date: 02/05/2022 ? ?PCP: [name], MD ?REFERRING PROVIDER: Lonia Blood, MD  ? ? PT End of Session - 02/05/22 1107   ? ? Visit Number 1   ? Number of Visits 17   Plus eval  ? Date for PT Re-Evaluation 04/30/22   ? Authorization Type [employer]   ? PT Start Time 1105   ? PT Stop Time 1145   ? PT Time Calculation (min) 40 min   ? Activity Tolerance Patient tolerated treatment well;Other (comment)   Pt limited by dizziness  ? Behavior During Therapy [facility] for tasks assessed/performed   ? ?  ?  ? ?  ? ? ?Past Medical History:  ?Diagnosis Date  ? Anemia   ? Graves disease   ? Hypothyroidism   ? ?Past Surgical History:  ?Procedure Laterality Date  ? CESAREAN SECTION    ? COLONOSCOPY N/A 11/10/2018  ? Procedure: COLONOSCOPY;  Surgeon: West Bali, MD;  [facility];  Service: Endoscopy;  Laterality: N/A;  1:30  ? Cyst removed from neck    ? IR ANGIO INTRA EXTRACRAN SEL COM CAROTID INNOMINATE BILAT MOD SED  01/28/2022  ? IR ANGIO VERTEBRAL SEL SUBCLAVIAN INNOMINATE UNI L MOD SED  01/28/2022  ? IR ANGIO VERTEBRAL SEL VERTEBRAL UNI R MOD SED  01/28/2022  ? IR US GUIDE VASC ACCESS RIGHT  01/28/2022  ? TOTAL ABDOMINAL HYSTERECTOMY  04/07/2019  ? Dr. Ernestina Penna  ? ?Patient Active Problem List  ? Diagnosis Date Noted  ? Neck pain 01/28/2022  ? Hyperlipidemia 01/28/2022  ? Headache 01/27/2022  ? Hypothyroidism 01/27/2022  ? GERD (gastroesophageal reflux disease) 01/27/2022  ? Dizziness 01/27/2022  ? CVA (cerebral vascular accident) (HCC) 01/27/2022  ? 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 Special screening for malignant neoplasms, colon   ? ? ?ONSET DATE: 01/30/2022 (referral) ? ?REFERRING DIAG: I77.74 (ICD-10-CM) - Vertebral artery dissection (HCC)  ? ?THERAPY DIAG:  ?Muscle weakness (generalized) ? ?Unsteadiness on feet ? ?Other abnormalities of gait and  mobility ? ?SUBJECTIVE:  ?                                                                                                                                                                                           ? ?SUBJECTIVE STATEMENT: ?"I am still a little weak on my left side". Reports she is frustrated with current mobility level. States she uses SB[employer] at home and in community, did not bring it with her to session. Gets dizzy for a few  seconds when first waking up in morning and when performing supine <>sit. Has been walking with husband 30-45 minutes daily, requires frequent rest breaks due to fatigue/dizziness but otherwise no problems. States she feels like she is dragging her LLE around.  ? ?Pt accompanied by: self ? ?PERTINENT HISTORY: history of anemia and Graves' disease who [facility] and was found on work-up to have a left vertebral artery occlusion with an acute cerebellar stroke. hypothyroidism, GERD, iron deficiency anemia  ? ?PAIN:  ?Are you having pain? Yes: NPRS scale: 6/10 ?Pain location: L side of neck and LUE ?Pain description: [employer], shooting  ? ?PRECAUTIONS: Fall ? ?WEIGHT BEARING RESTRICTIONS No ? ?FALLS: Has patient fallen in last 6 months? No ? ?LIVING ENVIRONMENT: ?Lives with: lives with their spouse ?Lives in: House/apartment ?Stairs: Yes: External: 4 steps; on right going up ?Has following equipment at home: Quad cane small base and Grab bars ? ?PLOF: Independent ? ?PATIENT GOALS "To get back to the way I was" ? ?OBJECTIVE:  ? ?COGNITION: ?Overall cognitive status: Within functional limits for tasks assessed ?  ?SENSATION: ?Reports LLE feels numb at times  ? ?COORDINATION: ?Heel to shin test: WFL LLE, decreased ROM on RLE but functional  ? ?POSTURE: rounded shoulders, forward head, and posterior pelvic tilt ? ? ?MMT:   ? ?MMT Right ?02/05/2022 Left ?02/05/2022  ?Hip flexion 5 4-  ?Hip extension    ?Hip abduction 5 2  ?Hip adduction 5 2  ?Hip internal  rotation    ?Hip external rotation    ?Knee flexion 5 3+  ?Knee extension 5 3-  ?Ankle dorsiflexion 5 3  ?Ankle plantarflexion 5 3+  ?Ankle inversion    ?Ankle eversion    ?(Blank rows = not tested) ? ? ?TRANSFERS: ?Assistive device utilized: None  ?Sit to stand: SBA ?Stand to sit: SBA ?Chair to chair: SBA ? ?STAIRS: ? Level of Assistance: SBA ? Stair Negotiation Technique: Step to Pattern with Single Rail on Right ? Number of Stairs: 6"  ? Height of Stairs: 4  ?Comments: Slow cadence, good BLE sequence  ? ?GAIT: ?Gait pattern: step through pattern, decreased arm swing- Left, decreased step length- Right, decreased stance time- Left, decreased stride length, decreased hip/knee flexion- Left, decreased ankle dorsiflexion- Left, Left hip hike, lateral lean- Left, and poor foot clearance- Left ?Distance walked: Various clinic distances  ?Assistive device utilized: None ?Level of assistance: SBA ?Comments: Pt maintains elbow flexion and adduction of LUE during gait. Noted decreased eccentric control of LLE during swing phase and into loading response.   ? ?FUNCTIONAL TESTs:  ? [facility] - 02/05/22 1125   ? ?  ? Transfers  ? Five time sit to stand comments  25.91s without BUE support and max dizziness   ?  ? Ambulation/Gait  ? Gait velocity 2.06s without AD   ?  ? Balance  ? Balance Assessed Yes   ?  ? Standardized Balance Assessment  ? Standardized Balance Assessment Berg Balance Test   ?  ? Berg Balance Test  ? Sit to Stand Able to stand without using hands and stabilize independently   ? Standing Unsupported Able to stand safely 2 minutes   ? Sitting with Back Unsupported but Feet Supported on Floor or Stool Able to sit safely and securely 2 minutes   ? Stand to Sit Sits safely with minimal use of hands   ? Transfers Able to transfer safely, minor use of hands   ?  Standing Unsupported with Eyes Closed Able to stand 10 seconds with supervision   ? Standing Unsupported with Feet Together Able to place feet  together independently and stand for 1 minute with supervision   ? From Standing, Reach Forward with Outstretched Arm Can reach forward >12 cm safely (5")   ? From Standing Position, Pick up Object from Floor Able to pick up shoe, needs supervision   ? From Standing Position, Turn to Look Behind Over each Shoulder Turn sideways only but maintains balance   ? Turn 360 Degrees Able to turn 360 degrees safely but slowly   ? Standing Unsupported, Alternately Place Feet on Step/Stool Able to stand independently and complete 8 steps >20 seconds   ? Standing Unsupported, One Foot in Front Able to take small step independently and hold 30 seconds   ? Standing on One Leg Tries to lift leg/unable to hold 3 seconds but remains standing independently   ? Total Score 42   ? Berg comment: significant fall risk   ? ?  ?  ? ?  ? ? ? ?TODAY'S TREATMENT:  ?See Clinical Impression Statement  ? ? ?PATIENT EDUCATION: ?Education details: Evaluation findings, POC, goals  ?Person educated: Patient ?Education method: Explanation ?Education comprehension: verbalized understanding and needs further education ? ? ?HOME EXERCISE PROGRAM: ?To be established next session  ? ? ? ?GOALS: ?Goals reviewed with patient? Yes ? ?SHORT TERM GOALS: Target date: 03/05/2022 ? ?Pt will be independent with independent HEP for improved strength, balance, transfers and gait. ? ?Baseline: ?Goal status: INITIAL ? ?2.  Pt will improve 5 x STS to less than or equal to 21 seconds without BUE support and minimal dizziness to demonstrate improved functional strength and transfer efficiency.   ?Baseline: 25.91s without BUE support, max dizziness  ?Goal status: INITIAL ? ?3.  Pt will improve Berg score to 47/56 for decreased fall risk ? ?Baseline: 42/56 ?Goal status: INITIAL ? ?4.  Pt will improve gait velocity to at least 2.4 ft/s with LRAD for improved gait efficiency and functional mobility ? ?Baseline: 2.06 ft/s without AD ?Goal status: INITIAL ? ? ? ?LONG TERM  GOALS: Target date: 04/02/2022 ? ?Pt will be independent with final HEP for improved strength, balance, transfers and gait. ? ?Baseline:  ?Goal status: INITIAL ? ?2.  Update goal when assessed  ?Baseline:  ?Goal st

## 2022-02-06 ENCOUNTER — Encounter: Payer: Self-pay | Admitting: Physical Therapy

## 2022-02-06 ENCOUNTER — Encounter: Payer: Self-pay | Admitting: Occupational Therapy

## 2022-02-06 ENCOUNTER — Ambulatory Visit: Payer: 59 | Admitting: Occupational Therapy

## 2022-02-06 ENCOUNTER — Ambulatory Visit: Payer: 59 | Admitting: Physical Therapy

## 2022-02-06 VITALS — BP 121/89

## 2022-02-06 DIAGNOSIS — R278 Other lack of coordination: Secondary | ICD-10-CM

## 2022-02-06 DIAGNOSIS — M25612 Stiffness of left shoulder, not elsewhere classified: Secondary | ICD-10-CM

## 2022-02-06 DIAGNOSIS — R2689 Other abnormalities of gait and mobility: Secondary | ICD-10-CM

## 2022-02-06 DIAGNOSIS — M6281 Muscle weakness (generalized): Secondary | ICD-10-CM

## 2022-02-06 DIAGNOSIS — R2681 Unsteadiness on feet: Secondary | ICD-10-CM

## 2022-02-06 DIAGNOSIS — R208 Other disturbances of skin sensation: Secondary | ICD-10-CM

## 2022-02-06 DIAGNOSIS — M25512 Pain in left shoulder: Secondary | ICD-10-CM

## 2022-02-06 NOTE — Therapy (Signed)
?OUTPATIENT OCCUPATIONAL THERAPY TREATMENT NOTE ? ? ?Patient Name: Kristy Abbott ?MRN: [id number] ?DOB:1966-01-03, 56 y.o., female ?Today's Date: 02/06/2022 ? ?PCP: [name], MD ?REFERRING PROVIDER: [name], MD ? ?END OF SESSION:  ? OT End of Session - 02/06/22 2774   ? ? Visit Number 2   ? Number of Visits 21   ? Date for OT Re-Evaluation 04/16/22   ? Authorization Type [employer] - VL 60   ? Authorization - Visit Number 2   ? Authorization - Number of Visits 30   ? OT Start Time [phone]   ? OT Stop Time 0926   ? OT Time Calculation (min) 40 min   ? Activity Tolerance Patient tolerated treatment well;Patient limited by pain   ? Behavior During Therapy [facility] for tasks assessed/performed   ? ?  ?  ? ?  ? ? ?Past Medical History:  ?Diagnosis Date  ? Anemia   ? Graves disease   ? Hypothyroidism   ? ?Past Surgical History:  ?Procedure Laterality Date  ? CESAREAN SECTION    ? COLONOSCOPY N/A 11/10/2018  ? Procedure: COLONOSCOPY;  Surgeon: West Bali, MD;  [facility];  Service: Endoscopy;  Laterality: N/A;  1:30  ? Cyst removed from neck    ? IR ANGIO INTRA EXTRACRAN SEL COM CAROTID INNOMINATE BILAT MOD SED  01/28/2022  ? IR ANGIO VERTEBRAL SEL SUBCLAVIAN INNOMINATE UNI L MOD SED  01/28/2022  ? IR ANGIO VERTEBRAL SEL VERTEBRAL UNI R MOD SED  01/28/2022  ? IR US GUIDE VASC ACCESS RIGHT  01/28/2022  ? TOTAL ABDOMINAL HYSTERECTOMY  04/07/2019  ? Dr. Ernestina Penna  ? ?Patient Active Problem List  ? Diagnosis Date Noted  ? Neck pain 01/28/2022  ? Hyperlipidemia 01/28/2022  ? Headache 01/27/2022  ? Hypothyroidism 01/27/2022  ? GERD (gastroesophageal reflux disease) 01/27/2022  ? Dizziness 01/27/2022  ? CVA (cerebral vascular accident) (HCC) 01/27/2022  ? 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 Special screening for malignant neoplasms, colon   ? ? ?ONSET DATE: 01/26/22 ? 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 ?THERAPY DIAG:  ?Acute pain of left shoulder ? ?Muscle weakness  (generalized) ? ?Stiffness of left shoulder, not elsewhere classified ? ?Other lack of coordination ? ?Unsteadiness on feet ? ?Other disturbances of skin sensation ? ? ?PERTINENT HISTORY: Patient is a 56 y.o. female who was seen today for occupational therapy evaluation s/p vertebral artery occlusion and Lt cerebellar stroke on 01/26/22 w/ residual LUE deficits in ROM, coordination, strength, and pain. PMH: Hyperlipidemia, GERD (gastroesophageal reflux disease) ? ?PRECAUTIONS: No driving, no lifting >5 lbs ? ?SUBJECTIVE: Pt reports neck pain  ? ?PAIN:  ?Are you having pain? Yes: NPRS scale: 5/10 ?Pain location: neck ?Pain description: sharp pain, aching ?Aggravating factors: movement ?Relieving factors: relaxing heat, tylenol ? ? ? ? ?OBJECTIVE:  ? ?TODAY'S TREATMENT: ?Pt was instructed in supine cane exercises  2 set of 5 reps each with rest in between and red putty HEP, see pt instructions, min v.c and demonstration. ?Hotpack applied to neck for pain relief  while pt performed supine exercises x 10 mins no adverse reactions ?Pt was instructed in red putty HEP. ? ? ? ? ? ?  ? ? ? ? ?PATIENT EDUCATION: ?Education details: HEP, supine shoulder exercises, red putty for grip and  pinch ?Person educated: Patient ?Education method: Explanation, demonstration, verbal cues ?Education comprehension: verbalized understanding, returned demonstration ? ? ?HOME EXERCISE PROGRAM: ?Cane exercises supine  02/06/22 ? ? ? ?GOALS: ? ? ?SHORT TERM GOALS: Target date: 03/05/2022 ? ?Pt will be independent with HEP targeting Lt shoulder pain and ROM ? ?Baseline: ?Goal status: INITIAL ? ?2.  Pt to cut food mod I w/ A/E prn ?Baseline:  ?Goal status: INITIAL ? ?3.  Pt to simulate tub transfers safely w/ DME prn and close sup ?Baseline:  ?Goal status: INITIAL ? ?4.  Pt to improve functional use of LUE by performing 28 or greater on [employer] ?Baseline: 19 ?Goal status: INITIAL ? ?5.  Pt to improve grip strength Lt hand to 35 lbs or greater for  opening jars/containers ?Baseline: 24.4 lbs (Rt = 66 lbs) ?Goal status: INITIAL ? ? ?LONG TERM GOALS: Target date: 04/16/2022 ? ?Independent with HEP for grip strength and coordination LT hand ?Baseline:  ?Goal status: INITIAL ? ?2.  Pt to return to cooking simple meal at distant supervision  ?Baseline: dependent  ?Goal status: INITIAL ? ?3.  Pt able to pick up and care for puppy using BUE's safely ?Baseline: unable ?Goal status: INITIAL ? ?4.  Pt to improve coordination as evidenced by performing 9 hole peg test in 28 sec or less ?Baseline: 34 sec (Rt = 18 sec) ?Goal status: INITIAL ? ?5.  Pt to improve LUE shoulder ROM by reaching into high shelf for light weight object w/ pain less than or equal to 3/10 ?Baseline: approx 90* w/ pain 6/10 ?Goal status: INITIAL ? ?6.  Pt to demo 90% shoulder movement for all functional tasks w/ pain 3/10 or under ?Baseline:  ?Goal status: INITIAL ? ?ASSESSMENT: ? ?CLINICAL IMPRESSION: ?Patient is progressing towards goals for initial HEP. ? ?PERFORMANCE DEFICITS in functional skills including ADLs, IADLs, coordination, sensation, ROM, strength, pain, FMC, mobility, body mechanics, decreased knowledge of use of DME, and UE functional use ? ?IMPAIRMENTS are limiting patient from ADLs, IADLs, rest and sleep, work, leisure, and social participation.  ? ?COMORBIDITIES may have co-morbidities  that affects occupational performance. Patient will benefit from skilled OT to address above impairments and improve overall function. ? ?MODIFICATION OR ASSISTANCE TO COMPLETE EVALUATION: No modification of tasks or assist necessary to complete an evaluation. ? ?OT OCCUPATIONAL PROFILE AND HISTORY: Detailed assessment: Review of records and additional review of physical, cognitive, psychosocial history related to current functional performance. ? ?CLINICAL DECISION MAKING: LOW - limited treatment options, no task modification necessary ? ?REHAB POTENTIAL: Good ? ?EVALUATION COMPLEXITY:  Low ? ? ? ?PLAN: ?OT FREQUENCY: 2x/week ? ?OT DURATION: 10 weeks, plus eval ? ?PLANNED INTERVENTIONS: self care/ADL training, therapeutic exercise, therapeutic activity, neuromuscular re-education, manual therapy, passive range of motion, functional mobility training, aquatic therapy, moist heat, cryotherapy, patient/family education, coping strategies training, and DME and/or AE instructions ? ?RECOMMENDED OTHER SERVICES: NONE ? ?CONSULTED AND AGREED WITH PLAN OF CARE: Patient ? ?PLAN FOR NEXT SESSION: review HEP for Lt shoulder ROM, putty HEP, progress prn ?

## 2022-02-06 NOTE — Therapy (Signed)
?OUTPATIENT PHYSICAL THERAPY TREATMENT NOTE ? ? ?Patient Name: Kristy Abbott ?MRN: [id number] ?DOB:[birth year], 56 y.o., female ?Today's Date: 02/06/2022 ? ?PCP: Kirstie PeriShah, Ashish, MD ?REFERRING PROVIDER: Lonia BloodMcClung, Jeffrey T, MD   ? ?END OF SESSION:  ? PT End of Session - 02/06/22 [id number]   ? ? Visit Number 2   ? Number of Visits 17   Plus eval  ? Date for PT Re-Evaluation 04/30/22   ? Authorization Type [employer]   ? PT Start Time 0805   ? PT Stop Time 0845   ? PT Time Calculation (min) 40 min   ? Equipment Utilized During Treatment Gait belt   ? Activity Tolerance Patient tolerated treatment well   ? [facility] tasks assessed/performed   ? ?  ?  ? ?  ? ? ?Past Medical History:  ?Diagnosis Date  ? Anemia   ? Graves disease   ? Hypothyroidism   ? ?Past Surgical History:  ?Procedure Laterality Date  ? CESAREAN SECTION    ? COLONOSCOPY N/A 11/10/2018  ? Procedure: COLONOSCOPY;  Surgeon: West BaliFields, Sandi L, MD;  [facility];  Service: Endoscopy;  Laterality: N/A;  1:30  ? Cyst removed from neck    ? IR ANGIO INTRA EXTRACRAN SEL COM CAROTID INNOMINATE BILAT MOD SED  01/28/2022  ? IR ANGIO VERTEBRAL SEL SUBCLAVIAN INNOMINATE UNI L MOD SED  01/28/2022  ? IR ANGIO VERTEBRAL SEL VERTEBRAL UNI R MOD SED  01/28/2022  ? IR US GUIDE VASC ACCESS RIGHT  01/28/2022  ? TOTAL ABDOMINAL HYSTERECTOMY  04/07/2019  ? Dr. Ernestina PennaNigel Buist  ? ?Patient Active Problem List  ? Diagnosis Date Noted  ? Neck pain 01/28/2022  ? Hyperlipidemia 01/28/2022  ? Headache 01/27/2022  ? Hypothyroidism 01/27/2022  ? GERD (gastroesophageal reflux disease) 01/27/2022  ? Dizziness 01/27/2022  ? CVA (cerebral vascular accident) (HCC) 01/27/2022  ? 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 Special screening for malignant neoplasms, colon   ? ? ?REFERRING DIAG: I77.74 (ICD-10-CM) - Vertebral artery dissection (HCC  ? ?THERAPY DIAG:  ?Unsteadiness on feet ? ?Other abnormalities of gait and mobility ? ?Muscle weakness (generalized) ? ?PERTINENT  HISTORY: history of anemia and Graves' disease who presented to the ER at [facility] with dizziness and was found on work-up to have a left vertebral artery occlusion with an acute cerebellar stroke. hypothyroidism, GERD, iron deficiency  ? ?PRECAUTIONS: Fall  ? ?SUBJECTIVE: No new complaints. No falls.  ? ?PAIN:  ?Are you having pain? Yes ?NPRS scale: 5/10 ?Pain location: left shoulder ?Pain orientation: Left  ?PAIN TYPE: acute ?Pain description: intermittent and sharp  ?Aggravating factors: certain movements ?Relieving factors: Tylenol, heat   ? ? ?TODAY'S TREATMENT: ? ? [facility] PT Assessment - 02/06/22 0810   ? ?  ? 6 Minute Walk- Baseline  ? 6 Minute Walk- Baseline yes   ? BP (mmHg) 125/99   ? HR (bpm) 70   ? 02 Sat (%RA) 99 %   ? Modified Borg Scale for Dyspnea 0- Nothing at all   ? Perceived Rate of Exertion (Borg) 6-   ?  ? 6 Minute walk- Post Test  ? 6 Minute Walk Post Test yes   ? BP (mmHg) 113/91   ? HR (bpm) 70   ? 02 Sat (%RA) 100 %   ? Modified Borg Scale for Dyspnea 3- Moderate shortness of breath or breathing difficulty   ? Perceived Rate of Exertion (Borg) 13- Somewhat hard   ?  ?  6 minute walk test results   ? Aerobic Endurance Distance Walked 745   ? Endurance additional comments with cane, no rest breaks taken. increased left LE fatigued with decreased stance time noted as distance/time progressed   ? ?  ?  ? ?STRENGTHENING/NMR: ?Issued HEP for strengthening and balance. Cues on correct ex form and technique needed with min guard assist for balance ex's. No issues noted or reported in session. Refer to [employer] program in Home Exercise Section of note for full details.  ?  ?BP at end of session: 120/95, HR 70 ? ?  ?  ?PATIENT EDUCATION: ?Education details: results of 6 minute walk test; initial HEP for strengthening/balance ?Person educated: Patient ?Education method: Explanation ?Education comprehension: verbalized understanding and needs further education ?  ?  ?HOME EXERCISE PROGRAM: ?Access  Code: [email or web address] ?URL: [email or web address] ?Date: 02/06/2022 ?Prepared by: Sallyanne Kuster ? ?Exercises ?- Sit to/from Stand in Stride position  - 1 x daily - 5 x weekly - 1 sets - 10 reps ?- walking marching  - 1 x daily - 5 x weekly - 1 sets - 3 reps ?- Tandem Walking with Counter Support  - 1 x daily - 5 x weekly - 1 sets - 3 reps ?- Standing Near Stance in [place] with Eyes Closed  - 1 x daily - 5 x weekly - 1 sets - 3 reps - 30 seconds hold ?  ?  ?  ?GOALS: ?Goals reviewed with patient? Yes ?  ?SHORT TERM GOALS: Target date: 03/05/2022 ?  ?Pt will be independent with independent HEP for improved strength, balance, transfers and gait. ?  ?Baseline: ?Goal status: INITIAL ?  ?2.  Pt will improve 5 x STS to less than or equal to 21 seconds without BUE support and minimal dizziness to demonstrate improved functional strength and transfer efficiency.   ?Baseline: 25.91s without BUE support, max dizziness  ?Goal status: INITIAL ?  ?3.  Pt will improve Berg score to 47/56 for decreased fall risk ?  ?Baseline: 42/56 ?Goal status: INITIAL ?  ?4.  Pt will improve gait velocity to at least 2.4 ft/s with LRAD for improved gait efficiency and functional mobility ?  ?Baseline: 2.06 ft/s without AD ?Goal status: INITIAL ?  ?  ?  ?LONG TERM GOALS: Target date: 04/02/2022 ?  ?Pt will be independent with final HEP for improved strength, balance, transfers and gait. ?  ?Baseline:  ?Goal status: INITIAL ?  ?2.  Update goal when assessed  ?Baseline:  ?Goal status: INITIAL ?  ?3.  Pt will improve Berg score to 51/56 for decreased fall risk ?  ?Baseline: 42/56 ?Goal status: INITIAL ?  ?4.  Pt will improve 5 x STS to less than or equal to 17 seconds to demonstrate improved functional strength and transfer efficiency.  ?  ?Baseline: 25.91s without BUE support  ?Goal status: INITIAL ?  ?5.  Pt will improve gait velocity to at least 2.8 ft/s with LRAD for improved gait efficiency and performance at community ambulator level  ?   ?Baseline: 2.04 ft/s without AD ?Goal status: INITIAL ?  ?  ?ASSESSMENT: ?  ?CLINICAL IMPRESSION: ?Today's skilled session intially focused on establishment of baseline score of 6 minute walk test. Remainder of session focused on issuing ex's for home to address strengthening and balance. No significant issues noted or reported in session. Pt's BP did elevate with activity and decreased some with rest breaks. Will need to keep a monitor of this with sessions. OT  made aware for session after PT today. The pt is progressing toward goals and should benefit from continued PT to progress toward unmet goals.  ?  ?  ?OBJECTIVE IMPAIRMENTS Abnormal gait, decreased activity tolerance, decreased balance, decreased coordination, decreased endurance, decreased mobility, difficulty walking, decreased strength, impaired sensation, impaired UE functional use, and pain.  ?  ?ACTIVITY LIMITATIONS cleaning, community activity, driving, meal prep, occupation, and medication management.  ?  ?PERSONAL FACTORS Fitness and 1 comorbidity: dizziness  are also affecting patient's functional outcome.  ?  ?  ?REHAB POTENTIAL: Good ?  ?CLINICAL DECISION MAKING: Stable/uncomplicated ?  ?EVALUATION COMPLEXITY: Low ?  ?PLAN: ?PT FREQUENCY: 2x/week ?  ?PT DURATION: 12 weeks ?  ?PLANNED INTERVENTIONS: Therapeutic exercises, Therapeutic activity, Neuromuscular re-education, Balance training, Gait training, Patient/Family education, Stair training, and DME instructions ?  ?PLAN FOR NEXT SESSION: Monitor BP. Continue to work on single leg stability, eccentric control of LLE, static/dynamic balance and endurance ? ? ?Sallyanne Kuster, PTA, CLT ?[facility] ?[address], [address] ?[place], [place] [address] ?[phone] ?02/06/22, 6:49 PM  ? ? ? ? ? ? ? ? ? ? ? ? ? ? ? ? ? ? ? ?   ?

## 2022-02-06 NOTE — Patient Instructions (Signed)
1. Grip Strengthening (Resistive Putty) ? ? ?Squeeze putty using thumb and all fingers. ?Repeat _20___ times. Do __2__ sessions per day. ? ? ?2. Roll putty into tube on table and pinch between each finger and thumb x 10 reps each. (can do ring and small finger together) ? ? ? ? ?Copyright ? VHI. All rights reserved.   ? ? ? ? ? ? ? ? ? ? ?ELBOW: Extension / Chest Press (Frame) ? ? ? ?Lie on back with knees bent. Straighten elbows to raise cane . Hold _5__ seconds. __5_ reps per set, __2_ sets per day, __7_ days per week ?Use steering wheel or hula hoop instead of frame. ? ? ? ? ? ? ? ? ? ?Shoulder: Flexion (Supine) ? ? ? ?With hands shoulder width apart, slowly lower dowel to shoulder height. Have left palm facing in. Do not let elbows bend. Keep back flat. ?Hold __3__ seconds. Repeat __5-10__ times. Do _1___ sessions per day. ?CAUTION: Stretch slowly and gently. ? ?Copyright ? VHI. All rights reserved.  ? ?Copyright ? VHI. All rights reserved.  ?

## 2022-02-10 ENCOUNTER — Ambulatory Visit: Payer: 59 | Admitting: Physical Therapy

## 2022-02-10 ENCOUNTER — Encounter: Payer: Self-pay | Admitting: Occupational Therapy

## 2022-02-10 ENCOUNTER — Ambulatory Visit: Payer: 59 | Admitting: Occupational Therapy

## 2022-02-10 DIAGNOSIS — R2689 Other abnormalities of gait and mobility: Secondary | ICD-10-CM

## 2022-02-10 DIAGNOSIS — M6281 Muscle weakness (generalized): Secondary | ICD-10-CM

## 2022-02-10 DIAGNOSIS — M25612 Stiffness of left shoulder, not elsewhere classified: Secondary | ICD-10-CM

## 2022-02-10 DIAGNOSIS — R2681 Unsteadiness on feet: Secondary | ICD-10-CM

## 2022-02-10 DIAGNOSIS — R278 Other lack of coordination: Secondary | ICD-10-CM

## 2022-02-10 DIAGNOSIS — M25512 Pain in left shoulder: Secondary | ICD-10-CM

## 2022-02-10 NOTE — Patient Instructions (Signed)
?  Coordination Activities ? ?Perform the following activities for 10-15 minutes 1-2 times per day with left hand(s). ? ?Rotate ball in fingertips (clockwise and counter-clockwise). ?Flip cards 1 at a time as fast as you can. ?Deal cards with your thumb (Hold deck in hand and push card off top with thumb). ?Rotate one card in hand (clockwise and counter-clockwise). ?Pick up coins one at a time until you get 5 in your hand, then move coins from palm to fingertips to stack one at a time. Do 3 stacks of 5 ?

## 2022-02-10 NOTE — Therapy (Signed)
?OUTPATIENT PHYSICAL THERAPY TREATMENT NOTE ? ? ?Patient Name: Kristy Abbott ?MRN: [id number] ?DOB:26-Nov-1965, 56 y.o., female ?Today's Date: 02/10/2022 ? ?PCP: [name], MD ?REFERRING PROVIDER: Lonia Blood, MD   ? ?END OF SESSION:  ? PT End of Session - 02/10/22 0848   ? ? Visit Number 3   ? Number of Visits 17   Plus eval  ? Date for PT Re-Evaluation 04/30/22   ? Authorization Type [employer]   ? PT Start Time [phone]   ? PT Stop Time 0927   ? PT Time Calculation (min) 40 min   ? Equipment Utilized During Treatment Gait belt   ? Activity Tolerance Patient tolerated treatment well   ? Behavior During Therapy [facility] for tasks assessed/performed   ? ?  ?  ? ?  ? ? ? ?Past Medical History:  ?Diagnosis Date  ? Anemia   ? Graves disease   ? Hypothyroidism   ? ?Past Surgical History:  ?Procedure Laterality Date  ? CESAREAN SECTION    ? COLONOSCOPY N/A 11/10/2018  ? Procedure: COLONOSCOPY;  Surgeon: West Bali, MD;  [facility];  Service: Endoscopy;  Laterality: N/A;  1:30  ? Cyst removed from neck    ? IR ANGIO INTRA EXTRACRAN SEL COM CAROTID INNOMINATE BILAT MOD SED  01/28/2022  ? IR ANGIO VERTEBRAL SEL SUBCLAVIAN INNOMINATE UNI L MOD SED  01/28/2022  ? IR ANGIO VERTEBRAL SEL VERTEBRAL UNI R MOD SED  01/28/2022  ? IR US GUIDE VASC ACCESS RIGHT  01/28/2022  ? TOTAL ABDOMINAL HYSTERECTOMY  04/07/2019  ? Dr. Ernestina Penna  ? ?Patient Active Problem List  ? Diagnosis Date Noted  ? Neck pain 01/28/2022  ? Hyperlipidemia 01/28/2022  ? Headache 01/27/2022  ? Hypothyroidism 01/27/2022  ? GERD (gastroesophageal reflux disease) 01/27/2022  ? Dizziness 01/27/2022  ? CVA (cerebral vascular accident) (HCC) 01/27/2022  ? 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 Special screening for malignant neoplasms, colon   ? ? ?REFERRING DIAG: I77.74 (ICD-10-CM) - Vertebral artery dissection (HCC  ? ?THERAPY DIAG:  ?Unsteadiness on feet ? ?Muscle weakness (generalized) ? ?Other abnormalities of gait and  mobility ? ?PERTINENT HISTORY: history of anemia and Graves' disease who presented to the ER at [facility] with dizziness and was found on work-up to have a left vertebral artery occlusion with an acute cerebellar stroke. hypothyroidism, GERD, iron deficiency  ? ?PRECAUTIONS: Fall  ? ?SUBJECTIVE: No new complaints. No falls.  ? ?PAIN:  ?Are you having pain? Yes ?NPRS scale: 3/10 ?Pain location: left shoulder ?Pain orientation: Left  ?PAIN TYPE: acute ?Pain description: intermittent and sharp  ?Aggravating factors: certain movements ?Relieving factors: Tylenol, heat   ? ?  VITALS: All BP readings taken in LUE in seated position ?   -Pre-session: 112/88 mmHg, HR 50 bpm  ?   -Taken 5 minutes later: 119/90 mmHg, 55 bm ?   -After SciFit: 156/83 mmHg, HR 80 bpm  ?   -Post-session: 144/97 mmHg, HR 79 bpm ? ?TODAY'S TREATMENT: ? Ther Ex ?SciFit level 5 for 2.5 minutes and level 4 for 3.5 minutes using BUE/BLEs for dynamic cardiovascular warmup, UE/LE coordination and neural priming for reciprocal movement. Min cues to maintain steps/min > 90. Pt able to maintain steps/min >65 throughout but fatigued quickly. Lengthy rest break required due to fatigue.  ? ?NMR  ?-Fwd/retro/lateral monster walks w/red theraband and UE support on counter for improved hip/glute strength and L NMR. x4 in each direction. CGA throughout for  safety. Short seated rest break taken in between directions. Noted good eccentric control of LLE throughout but maintained knee extension of LLE w/fwd and retro directions.  ? ?Self-care/home management  ?Lengthy discussion regarding monitoring BP at home before and after exercise and contacting Dr. Pearlean Brownie if diastolic BP continues to read >33. Pt verbalized understanding.  ? ?  ?PATIENT EDUCATION: ?Education details: results of 6 minute walk test; initial HEP for strengthening/balance, see self-care section ?Person educated: Patient ?Education method: Explanation ?Education comprehension: verbalized  understanding and needs further education ?  ?  ?HOME EXERCISE PROGRAM: ?Access Code: [email or web address] ?URL: [email or web address] ?Date: 02/06/2022 ?Prepared by: Sallyanne Kuster ? ?Exercises ?- Sit to/from Stand in Stride position  - 1 x daily - 5 x weekly - 1 sets - 10 reps ?- walking marching  - 1 x daily - 5 x weekly - 1 sets - 3 reps ?- Tandem Walking with Counter Support  - 1 x daily - 5 x weekly - 1 sets - 3 reps ?- Standing Near Stance in [place] with Eyes Closed  - 1 x daily - 5 x weekly - 1 sets - 3 reps - 30 seconds hold ?  ?  ?  ?GOALS: ?Goals reviewed with patient? Yes ?  ?SHORT TERM GOALS: Target date: 03/05/2022 ?  ?Pt will be independent with independent HEP for improved strength, balance, transfers and gait. ?  ?Baseline: ?Goal status: INITIAL ?  ?2.  Pt will improve 5 x STS to less than or equal to 21 seconds without BUE support and minimal dizziness to demonstrate improved functional strength and transfer efficiency.   ?Baseline: 25.91s without BUE support, max dizziness  ?Goal status: INITIAL ?  ?3.  Pt will improve Berg score to 47/56 for decreased fall risk ?  ?Baseline: 42/56 ?Goal status: INITIAL ?  ?4.  Pt will improve gait velocity to at least 2.4 ft/s with LRAD for improved gait efficiency and functional mobility ?  ?Baseline: 2.06 ft/s without AD ?Goal status: INITIAL ?  ?  ?  ?LONG TERM GOALS: Target date: 04/02/2022 ?  ?Pt will be independent with final HEP for improved strength, balance, transfers and gait. ?  ?Baseline:  ?Goal status: INITIAL ?  ?2.  Pt will ambulate greater than or equal to 1,000 feet on with LRAD mod I for improved cardiovascular endurance and BLE strength.  ? ?Baseline: 57' w/SBQC and CGA ?Goal status: INITIAL ?  ?3.  Pt will improve Berg score to 51/56 for decreased fall risk ?  ?Baseline: 42/56 ?Goal status: INITIAL ?  ?4.  Pt will improve 5 x STS to less than or equal to 17 seconds to demonstrate improved functional strength and transfer efficiency.  ?   ?Baseline: 25.91s without BUE support  ?Goal status: INITIAL ?  ?5.  Pt will improve gait velocity to at least 2.8 ft/s with LRAD for improved gait efficiency and performance at community ambulator level  ?  ?Baseline: 2.04 ft/s without AD ?Goal status: INITIAL ?  ?  ?ASSESSMENT: ?  ?CLINICAL IMPRESSION: ?Emphasis of skilled PT session on LLE eccentric control, endurance and education on BP monitoring. Pt's diastolic BP elevated at beginning of session and decreased following cardiovascular conditioning on SciFit. However, by end of session, diastolic BP back up to 97. Encouraged pt to monitor BP before and after exercise at home to monitor cardiovascular response and report symptoms of dizziness. No dizziness reported today. Continue POC.  ?  ?  ?OBJECTIVE IMPAIRMENTS Abnormal gait, decreased activity  tolerance, decreased balance, decreased coordination, decreased endurance, decreased mobility, difficulty walking, decreased strength, impaired sensation, impaired UE functional use, and pain.  ?  ?ACTIVITY LIMITATIONS cleaning, community activity, driving, meal prep, occupation, and medication management.  ?  ?PERSONAL FACTORS Fitness and 1 comorbidity: dizziness  are also affecting patient's functional outcome.  ?  ?  ?REHAB POTENTIAL: Good ?  ?CLINICAL DECISION MAKING: Stable/uncomplicated ?  ?EVALUATION COMPLEXITY: Low ?  ?PLAN: ?PT FREQUENCY: 2x/week ?  ?PT DURATION: 12 weeks ?  ?PLANNED INTERVENTIONS: Therapeutic exercises, Therapeutic activity, Neuromuscular re-education, Balance training, Gait training, Patient/Family education, Stair training, and DME instructions ?  ?PLAN FOR NEXT SESSION: Monitor BP. Eccentric step downs (fwd & lateral), gait w/weight on LLE, heel/toes walks, tall > half kneel, treadmill training  ? ?Jill AlexandersJannah E Adoria Kawamoto, PT, DPT ?02/10/22, 9:35 AM  ? ? ? ? ? ? ? ? ? ? ? ? ? ? ? ? ? ? ? ?   ?

## 2022-02-10 NOTE — Therapy (Signed)
?OUTPATIENT OCCUPATIONAL THERAPY TREATMENT NOTE ? ? ?Patient Name: Kristy Abbott ?MRN: [id number] ?DOB:26-Mar-1966, 56 y.o., female ?Today's Date: 02/10/2022 ? ?PCP: Monico Blitz, MD ?REFERRING PROVIDER: Cherene Altes, MD ? ?END OF SESSION:  ? OT End of Session - 02/10/22 0802   ? ? Visit Number 3   ? Number of Visits 21   ? Date for OT Re-Evaluation 04/16/22   ? Authorization Type UHC - VL 60   ? Authorization - Visit Number 3   ? Authorization - Number of Visits 30   ? OT Start Time 0800   ? OT Stop Time 0845   ? OT Time Calculation (min) 45 min   ? Activity Tolerance Patient tolerated treatment well;Patient limited by pain   ? Behavior During Therapy [facility] for tasks assessed/performed   ? ?  ?  ? ?  ? ? ?Past Medical History:  ?Diagnosis Date  ? Anemia   ? Graves disease   ? Hypothyroidism   ? ?Past Surgical History:  ?Procedure Laterality Date  ? CESAREAN SECTION    ? COLONOSCOPY N/A 11/10/2018  ? Procedure: COLONOSCOPY;  Surgeon: Danie Binder, MD;  [facility];  Service: Endoscopy;  Laterality: N/A;  1:30  ? Cyst removed from neck    ? IR ANGIO INTRA EXTRACRAN SEL COM CAROTID INNOMINATE BILAT MOD SED  01/28/2022  ? IR ANGIO VERTEBRAL SEL SUBCLAVIAN INNOMINATE UNI L MOD SED  01/28/2022  ? IR ANGIO VERTEBRAL SEL VERTEBRAL UNI R MOD SED  01/28/2022  ? IR US GUIDE VASC ACCESS RIGHT  01/28/2022  ? TOTAL ABDOMINAL HYSTERECTOMY  04/07/2019  ? Dr. Gari Crown  ? ?Patient Active Problem List  ? Diagnosis Date Noted  ? Neck pain 01/28/2022  ? Hyperlipidemia 01/28/2022  ? Headache 01/27/2022  ? Hypothyroidism 01/27/2022  ? GERD (gastroesophageal reflux disease) 01/27/2022  ? Dizziness 01/27/2022  ? CVA (cerebral vascular accident) ([place]) 01/27/2022  ? 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 Special screening for malignant neoplasms, colon   ? ? ?ONSET DATE: 01/26/22 ? 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 ?THERAPY DIAG:  ?Acute pain of left shoulder ? ?Stiffness of left  shoulder, not elsewhere classified ? ?Muscle weakness (generalized) ? ?Other lack of coordination ? ? ?PERTINENT HISTORY: Patient is a 56 y.o. 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 PMH: Hyperlipidemia, GERD (gastroesophageal reflux disease) ? ?PRECAUTIONS: No driving, no lifting >5 lbs ? ?SUBJECTIVE: Shoulder exercises are going well ? ?PAIN:  ?Are you having pain? Yes: NPRS scale: 3/10 ?Pain location: neck and Lt shoulder ?Pain description: sharp pain, aching ?Aggravating factors: movement ?Relieving factors: relaxing heat, tylenol ? ? ? ? ?OBJECTIVE:  ? ?TODAY'S TREATMENT: ?Issued coordination HEP and return demo of each.  ?Reviewed shoulder HEP. Discussed proper shoulder positioning with ex's and w/ functional reaching. Also worked seated into gravity and against gravity to 90* w/ no increase in shoulder pain w/ external visual aids to assist w/ correct positioning ?UBE x 5 min, level 1 for BUE reciprocal movement pattern ? ?Practiced cutting food w/ and w/o AE. Pt required cues to stabilize fork correctly w/ Lt hand. Reviewed things to avoid w/ LUE for now including: nothing hot, heavy, breakable, or sharp ? ? ? ? ? ?  ? ? ? ? ?PATIENT EDUCATION: ?Education details: coordination HEP, review of supine shoulder exercises,  ?Person educated: Patient ?  Education method: Explanation, demonstration, verbal cues ?Education comprehension: verbalized understanding, returned demonstration ? ? ?HOME EXERCISE PROGRAM: ?Cane exercises supine, putty HEP 02/06/22 ?Coordination HEP 02/10/22 ? ? ? ?GOALS: ? ? ?SHORT TERM GOALS: Target date: 03/05/2022 ? ?Pt will be independent with HEP targeting Lt shoulder pain and ROM ? ?Baseline: ?Goal status: ONGOING ? ?2.  Pt to cut food mod I w/ A/E prn ?Baseline:  ?Goal status: MET in clinic using Lt hand as stabilizer w/ regular utensils ? ?3.  Pt to simulate tub transfers safely w/ DME prn  and close sup ?Baseline:  ?Goal status: INITIAL ? ?4.  Pt to improve functional use of LUE by performing 28 or greater on [employer] ?Baseline: 19 ?Goal status: INITIAL ? ?5.  Pt to improve grip strength Lt hand to 35 lbs or greater for opening jars/containers ?Baseline: 24.4 lbs (Rt = 66 lbs) ?Goal status: INITIAL ? ? ?LONG TERM GOALS: Target date: 04/16/2022 ? ?Independent with HEP for grip strength and coordination LT hand ?Baseline:  ?Goal status: ONGOING ? ?2.  Pt to return to cooking simple meal at distant supervision  ?Baseline: dependent  ?Goal status: INITIAL ? ?3.  Pt able to pick up and care for puppy using BUE's safely ?Baseline: unable ?Goal status: INITIAL ? ?4.  Pt to improve coordination as evidenced by performing 9 hole peg test in 28 sec or less ?Baseline: 34 sec (Rt = 18 sec) ?Goal status: INITIAL ? ?5.  Pt to improve LUE shoulder ROM by reaching into high shelf for light weight object w/ pain less than or equal to 3/10 ?Baseline: approx 90* w/ pain 6/10 ?Goal status: INITIAL ? ?6.  Pt to demo 90% shoulder movement for all functional tasks w/ pain 3/10 or under ?Baseline:  ?Goal status: INITIAL ? ?ASSESSMENT: ? ?CLINICAL IMPRESSION: ?Patient is progressing towards goals. Pt with increased understanding of proper positioning of LUE w/ reaching ? ?PERFORMANCE DEFICITS in functional skills including ADLs, IADLs, coordination, sensation, ROM, strength, pain, FMC, mobility, body mechanics, decreased knowledge of use of DME, and UE functional use ? ?IMPAIRMENTS are limiting patient from ADLs, IADLs, rest and sleep, work, leisure, and social participation.  ? ?COMORBIDITIES may have co-morbidities  that affects occupational performance. Patient will benefit from skilled OT to address above impairments and improve overall function. ? ?MODIFICATION OR ASSISTANCE TO COMPLETE EVALUATION: No modification of tasks or assist necessary to complete an evaluation. ? ?OT OCCUPATIONAL PROFILE AND HISTORY: Detailed  assessment: Review of records and additional review of physical, cognitive, psychosocial history related to current functional performance. ? ?CLINICAL DECISION MAKING: LOW - limited treatment options, no task modification necessary ? ?REHAB POTENTIAL: Good ? ?EVALUATION COMPLEXITY: Low ? ? ? ?PLAN: ?OT FREQUENCY: 2x/week ? ?OT DURATION: 10 weeks, plus eval ? ?PLANNED INTERVENTIONS: self care/ADL training, therapeutic exercise, therapeutic activity, neuromuscular re-education, manual therapy, passive range of motion, functional mobility training, aquatic therapy, moist heat, cryotherapy, patient/family education, coping strategies training, and DME and/or AE instructions ? ?RECOMMENDED OTHER SERVICES: NONE ? ?CONSULTED AND AGREED WITH PLAN OF CARE: Patient ? ?PLAN FOR NEXT SESSION: simulate tub transfer, continue LUE NMR and functional reaching ? ?Redmond Baseman, OTR/L ?02/10/22 8:45 AM ?Phone [phone] ?FAX [phone] ? ?

## 2022-02-13 ENCOUNTER — Emergency Department (HOSPITAL_COMMUNITY): Payer: 59

## 2022-02-13 ENCOUNTER — Other Ambulatory Visit: Payer: Self-pay

## 2022-02-13 ENCOUNTER — Observation Stay (HOSPITAL_COMMUNITY)
Admission: EM | Admit: 2022-02-13 | Discharge: 2022-02-14 | Disposition: A | Discharge status: Home / Self Care | Payer: 59 | Attending: Family Medicine | Admitting: Family Medicine

## 2022-02-13 ENCOUNTER — Encounter (HOSPITAL_COMMUNITY): Payer: Self-pay

## 2022-02-13 DIAGNOSIS — E039 Hypothyroidism, unspecified: Secondary | ICD-10-CM | POA: Diagnosis not present

## 2022-02-13 DIAGNOSIS — Z79899 Other long term (current) drug therapy: Secondary | ICD-10-CM | POA: Diagnosis not present

## 2022-02-13 DIAGNOSIS — Z7902 Long term (current) use of antithrombotics/antiplatelets: Secondary | ICD-10-CM | POA: Diagnosis not present

## 2022-02-13 DIAGNOSIS — I63219 Cerebral infarction due to unspecified occlusion or stenosis of unspecified vertebral arteries: Secondary | ICD-10-CM | POA: Diagnosis not present

## 2022-02-13 DIAGNOSIS — Z87891 Personal history of nicotine dependence: Secondary | ICD-10-CM | POA: Insufficient documentation

## 2022-02-13 DIAGNOSIS — Z8673 Personal history of transient ischemic attack (TIA), and cerebral infarction without residual deficits: Secondary | ICD-10-CM | POA: Insufficient documentation

## 2022-02-13 DIAGNOSIS — Z7982 Long term (current) use of aspirin: Secondary | ICD-10-CM | POA: Insufficient documentation

## 2022-02-13 DIAGNOSIS — I639 Cerebral infarction, unspecified: Secondary | ICD-10-CM | POA: Diagnosis not present

## 2022-02-13 DIAGNOSIS — R202 Paresthesia of skin: Secondary | ICD-10-CM | POA: Diagnosis present

## 2022-02-13 DIAGNOSIS — R531 Weakness: Secondary | ICD-10-CM

## 2022-02-13 DIAGNOSIS — Z20822 Contact with and (suspected) exposure to covid-19: Secondary | ICD-10-CM | POA: Insufficient documentation

## 2022-02-13 DIAGNOSIS — R42 Dizziness and giddiness: Secondary | ICD-10-CM

## 2022-02-13 DIAGNOSIS — R2990 Unspecified symptoms and signs involving the nervous system: Secondary | ICD-10-CM

## 2022-02-13 DIAGNOSIS — I6509 Occlusion and stenosis of unspecified vertebral artery: Secondary | ICD-10-CM | POA: Diagnosis present

## 2022-02-13 LAB — DIFFERENTIAL
Abs Immature Granulocytes: 0.01 10*3/uL (ref 0.00–0.07)
Basophils Absolute: 0 10*3/uL (ref 0.0–0.1)
Basophils Relative: 1 %
Eosinophils Absolute: 0.1 10*3/uL (ref 0.0–0.5)
Eosinophils Relative: 2 %
Immature Granulocytes: 0 %
Lymphocytes Relative: 35 %
Lymphs Abs: 1.4 10*3/uL (ref 0.7–4.0)
Monocytes Absolute: 0.3 10*3/uL (ref 0.1–1.0)
Monocytes Relative: 8 %
Neutro Abs: 2.2 10*3/uL (ref 1.7–7.7)
Neutrophils Relative %: 54 %

## 2022-02-13 LAB — URINALYSIS, ROUTINE W REFLEX MICROSCOPIC
Bacteria, UA: NONE SEEN
Bilirubin Urine: NEGATIVE
Glucose, UA: NEGATIVE mg/dL
Ketones, ur: NEGATIVE mg/dL
Leukocytes,Ua: NEGATIVE
Nitrite: NEGATIVE
Protein, ur: NEGATIVE mg/dL
Specific Gravity, Urine: 1.029 (ref 1.005–1.030)
pH: 6 (ref 5.0–8.0)

## 2022-02-13 LAB — CBC
HCT: 45.1 % (ref 36.0–46.0)
Hemoglobin: 15.3 g/dL — ABNORMAL HIGH (ref 12.0–15.0)
MCH: 31.1 pg (ref 26.0–34.0)
MCHC: 33.9 g/dL (ref 30.0–36.0)
MCV: 91.7 fL (ref 80.0–100.0)
Platelets: 249 10*3/uL (ref 150–400)
RBC: 4.92 MIL/uL (ref 3.87–5.11)
RDW: 13.2 % (ref 11.5–15.5)
WBC: 4 10*3/uL (ref 4.0–10.5)
nRBC: 0 % (ref 0.0–0.2)

## 2022-02-13 LAB — COMPREHENSIVE METABOLIC PANEL
ALT: 22 U/L (ref 0–44)
AST: 23 U/L (ref 15–41)
Albumin: 5.2 g/dL — ABNORMAL HIGH (ref 3.5–5.0)
Alkaline Phosphatase: 66 U/L (ref 38–126)
Anion gap: 8 (ref 5–15)
BUN: 15 mg/dL (ref 6–20)
CO2: 25 mmol/L (ref 22–32)
Calcium: 9.9 mg/dL (ref 8.9–10.3)
Chloride: 104 mmol/L (ref 98–111)
Creatinine, Ser: 0.63 mg/dL (ref 0.44–1.00)
GFR, Estimated: >60 mL/min (ref >60)
Glucose, Bld: 96 mg/dL (ref 70–99)
Potassium: 4.2 mmol/L (ref 3.5–5.1)
Sodium: 137 mmol/L (ref 135–145)
Total Bilirubin: 0.7 mg/dL (ref 0.3–1.2)
Total Protein: 9.1 g/dL — ABNORMAL HIGH (ref 6.5–8.1)

## 2022-02-13 LAB — ETHANOL: Alcohol, Ethyl (B): <10 mg/dL (ref <10)

## 2022-02-13 LAB — CBG MONITORING, ED: Glucose-Capillary: 99 mg/dL (ref 70–99)

## 2022-02-13 LAB — RAPID URINE DRUG SCREEN, HOSP PERFORMED
Amphetamines: NOT DETECTED
Barbiturates: NOT DETECTED
Benzodiazepines: NOT DETECTED
Cocaine: NOT DETECTED
Opiates: NOT DETECTED
Tetrahydrocannabinol: NOT DETECTED

## 2022-02-13 LAB — RESP PANEL BY RT-PCR (FLU A&B, COVID) ARPGX2
Influenza A by PCR: NEGATIVE
Influenza B by PCR: NEGATIVE
SARS Coronavirus 2 by RT PCR: NEGATIVE

## 2022-02-13 LAB — PROTIME-INR
INR: 0.9 (ref 0.8–1.2)
Prothrombin Time: 12.4 seconds (ref 11.4–15.2)

## 2022-02-13 LAB — APTT: aPTT: 28 seconds (ref 24–36)

## 2022-02-13 MED ORDER — ROSUVASTATIN CALCIUM 20 MG PO TABS
20.0000 mg | ORAL_TABLET | Freq: Every day | ORAL | Status: DC
  Administered 2022-02-13 – 2022-02-14 (×2): 20 mg via ORAL
  Filled 2022-02-13 (×2): qty 1

## 2022-02-13 MED ORDER — ACETAMINOPHEN 650 MG RE SUPP: 650.0000 mg | RECTAL | Status: DC | PRN

## 2022-02-13 MED ORDER — LEVOTHYROXINE SODIUM 88 MCG PO TABS
88.0000 ug | ORAL_TABLET | Freq: Every day | ORAL | Status: DC
  Administered 2022-02-14: 88 ug via ORAL
  Filled 2022-02-13: qty 1

## 2022-02-13 MED ORDER — KETOROLAC TROMETHAMINE 30 MG/ML IJ SOLN
30.0000 mg | Freq: Once | INTRAMUSCULAR | Status: AC
  Administered 2022-02-13: 30 mg via INTRAVENOUS
  Filled 2022-02-13: qty 1

## 2022-02-13 MED ORDER — ACETAMINOPHEN 325 MG PO TABS
650.0000 mg | ORAL_TABLET | ORAL | Status: DC | PRN
  Administered 2022-02-13 – 2022-02-14 (×2): 650 mg via ORAL
  Filled 2022-02-13 (×2): qty 2

## 2022-02-13 MED ORDER — ACETAMINOPHEN 160 MG/5ML PO SOLN: 650.0000 mg | ORAL | Status: DC | PRN

## 2022-02-13 MED ORDER — CLOPIDOGREL BISULFATE 75 MG PO TABS
75.0000 mg | ORAL_TABLET | Freq: Every day | ORAL | Status: DC
  Administered 2022-02-13 – 2022-02-14 (×2): 75 mg via ORAL
  Filled 2022-02-13 (×2): qty 1

## 2022-02-13 MED ORDER — ASPIRIN EC 81 MG PO TBEC
81.0000 mg | DELAYED_RELEASE_TABLET | Freq: Every day | ORAL | Status: DC
  Administered 2022-02-13 – 2022-02-14 (×2): 81 mg via ORAL
  Filled 2022-02-13 (×2): qty 1

## 2022-02-13 MED ORDER — IOHEXOL 350 MG/ML SOLN
75.0000 mL | Freq: Once | INTRAVENOUS | Status: AC | PRN
  Administered 2022-02-13: 75 mL via INTRAVENOUS

## 2022-02-13 MED ORDER — STROKE: EARLY STAGES OF RECOVERY BOOK
Freq: Once | Status: AC
  Filled 2022-02-13: qty 1

## 2022-02-13 MED ORDER — LORAZEPAM 2 MG/ML IJ SOLN
1.0000 mg | Freq: Once | INTRAMUSCULAR | Status: AC
  Administered 2022-02-13: 1 mg via INTRAVENOUS
  Filled 2022-02-13: qty 1

## 2022-02-13 NOTE — ED Provider Notes (Signed)
?[facility] ?Provider Note ? ? ?CSN: [id number] ?Arrival date & time: 02/13/22  1235 ? ?An emergency department physician performed an initial assessment on this suspected stroke patient at 59. ? ?History ? ?Chief Complaint  ?Patient presents with  ? Dizziness  ? ? ?Kristy Abbott is a 56 y.o. female. ? ?Patient presents with dizziness since 3 PM yesterday along with weakness on left side.  History of a stroke ? ?The history is provided by the patient and medical records.  ?Dizziness ?Quality:  Head spinning ?Severity:  Moderate ?Onset quality:  Sudden ?Timing:  Constant ?Progression:  Waxing and waning ?Chronicity:  Recurrent ?Context: not with bowel movement   ?Relieved by:  Nothing ?Worsened by:  Nothing ?Ineffective treatments:  None tried ?Associated symptoms: no chest pain, no diarrhea and no headaches   ? ?  ? ?Home Medications ?Prior to Admission medications   ?Medication Sig Start Date End Date Taking? Authorizing Provider  ?acetaminophen (TYLENOL) 325 MG tablet Take 2 tablets (650 mg total) by mouth every 6 (six) hours as needed for mild pain (or Fever >/= 101). 01/30/22  Yes Lonia Blood, MD  ?aspirin EC 81 MG EC tablet Take 1 tablet (81 mg total) by mouth daily. Swallow whole. 01/31/22  Yes Lonia Blood, MD  ?cetirizine (ZYRTEC ALLERGY) 10 MG tablet Take 1 tablet (10 mg total) by mouth daily. 11/05/19  Yes Avegno, Zachery Dakins, FNP  ?clopidogrel (PLAVIX) 75 MG tablet Take 1 tablet (75 mg total) by mouth daily. 01/31/22 05/01/22 Yes Lonia Blood, MD  ?Ferrous Sulfate 142 (45 Fe) MG TBCR Take 45 mg by mouth every other day.   Yes [provider]  ?levothyroxine (SYNTHROID, LEVOTHROID) 88 MCG tablet Take 88 mcg by mouth daily before breakfast.   Yes [provider]  ?meclizine (ANTIVERT) 25 MG tablet Take 1 tablet (25 mg total) by mouth 3 (three) times daily as needed for dizziness. 01/23/21  Yes Couture, Cortni S, PA-C  ?omeprazole (PRILOSEC) 40 MG  capsule Take 40 mg by mouth daily.   Yes [provider]  ?rosuvastatin (CRESTOR) 20 MG tablet Take 1 tablet (20 mg total) by mouth daily. 01/31/22  Yes Lonia Blood, MD  ?   ? ?Allergies    ?Patient has no known allergies.   ? ?Review of Systems   ?Review of Systems  ?Constitutional:  Negative for appetite change and fatigue.  ?HENT:  Negative for congestion, ear discharge and sinus pressure.   ?Eyes:  Negative for discharge.  ?Respiratory:  Negative for cough.   ?Cardiovascular:  Negative for chest pain.  ?Gastrointestinal:  Negative for abdominal pain and diarrhea.  ?Genitourinary:  Negative for frequency and hematuria.  ?Musculoskeletal:  Negative for back pain.  ?Skin:  Negative for rash.  ?Neurological:  Positive for dizziness. Negative for seizures and headaches.  ?     Weakness left arm left leg  ?Psychiatric/Behavioral:  Negative for hallucinations.   ? ?Physical Exam ?Updated Vital Signs ?BP (!) 141/96 (BP Location: Left Arm)   Pulse (!) 54   Temp (!) 97.4 ?F (36.3 ?C)   Resp 17   Ht 5\' 2"  (1.575 m)   Wt 71.7 kg   LMP 04/14/2019   SpO2 100%   BMI 28.91 kg/m?  ?Physical Exam ?Vitals and nursing note reviewed.  ?Constitutional:   ?   Appearance: She is well-developed.  ?HENT:  ?   Head: Normocephalic.  ?   Comments: No nystagmus ?  Nose: Nose normal.  ?Eyes:  ?   General: No scleral icterus. ?   Conjunctiva/sclera: Conjunctivae normal.  ?Neck:  ?   Thyroid: No thyromegaly.  ?Cardiovascular:  ?   Rate and Rhythm: Normal rate and regular rhythm.  ?   Heart sounds: No murmur heard. ?  No friction rub. No gallop.  ?Pulmonary:  ?   Breath sounds: No stridor. No wheezing or rales.  ?Chest:  ?   Chest wall: No tenderness.  ?Abdominal:  ?   General: There is no distension.  ?   Tenderness: There is no abdominal tenderness. There is no rebound.  ?Musculoskeletal:     ?   General: Normal range of motion.  ?   Cervical back: Neck supple.  ?   Comments: Mild weakness left arm left leg   ?Lymphadenopathy:  ?   Cervical: No cervical adenopathy.  ?Skin: ?   Findings: No erythema or rash.  ?Neurological:  ?   Mental Status: She is alert and oriented to person, place, and time.  ?   Motor: No abnormal muscle tone.  ?   Coordination: Coordination normal.  ?Psychiatric:     ?   Behavior: Behavior normal.  ? ? ?ED Results / Procedures / Treatments   ?Labs ?(all labs ordered are listed, but only abnormal results are displayed) ?Labs Reviewed  ?CBC - Abnormal; Notable for the following components:  ?    Result Value  ? Hemoglobin 15.3 (*)   ? All other components within normal limits  ?COMPREHENSIVE METABOLIC PANEL - Abnormal; Notable for the following components:  ? Total Protein 9.1 (*)   ? Albumin 5.2 (*)   ? All other components within normal limits  ?URINALYSIS, ROUTINE W REFLEX MICROSCOPIC - Abnormal; Notable for the following components:  ? Color, Urine STRAW (*)   ? Hgb urine dipstick SMALL (*)   ? All other components within normal limits  ?RESP PANEL BY RT-PCR (FLU A&B, COVID) ARPGX2  ?ETHANOL  ?PROTIME-INR  ?APTT  ?DIFFERENTIAL  ?RAPID URINE DRUG SCREEN, HOSP PERFORMED  ?CBG MONITORING, ED  ?I-STAT CHEM 8, ED  ? ? ?EKG ?EKG Interpretation ? ?Date/Time:  Thursday February 13 2022 12:48:49 EDT ?Ventricular Rate:  65 ?PR Interval:  150 ?QRS Duration: 95 ?QT Interval:  417 ?QTC Calculation: 434 ?R Axis:   71 ?Text Interpretation: Sinus rhythm Nonspecific T abnrm, anterolateral leads Confirmed by Eber Hong ([id number]) on 02/13/2022 3:42:34 PM ? ?Radiology ?CT ANGIO HEAD NECK W WO CM ? ?Result Date: 02/13/2022 ?CLINICAL DATA:  Headache, dizziness, facial numbness, left-sided blurry vision and left-sided weakness since 6:30 p.m. last night. History of stroke EXAM: CT ANGIOGRAPHY HEAD AND NECK TECHNIQUE: Multidetector CT imaging of the head and neck was performed using the standard protocol during bolus administration of intravenous contrast. Multiplanar CT image reconstructions and MIPs were obtained to  evaluate the vascular anatomy. Carotid stenosis measurements (when applicable) are obtained utilizing NASCET criteria, using the distal internal carotid diameter as the denominator. RADIATION DOSE REDUCTION: This exam was performed according to the departmental dose-optimization program which includes automated exposure control, adjustment of the mA and/or kV according to patient size and/or use of iterative reconstruction technique. CONTRAST:  72mL OMNIPAQUE IOHEXOL 350 MG/ML SOLN COMPARISON:  CT a head/neck 01/26/2022 FINDINGS: CTA NECK FINDINGS Aortic arch: The aortic arch is normal. The origins of the major branch vessels are patent. The subclavian arteries are patent to the level imaged. Right carotid system: The right common, internal, and external carotid  arteries are patent with mild plaque at the bifurcation but no hemodynamically significant stenosis or occlusion. There is no dissection or aneurysm. Left carotid system: The left common, internal, and external carotid arteries are patent, without hemodynamically significant stenosis or occlusion. There is no dissection or aneurysm. Vertebral arteries: The left vertebral artery is occluded at its origin with diminutive reconstitution of flow in the V2 segment with an additional occlusion at the V2/V3 junction and reconstitution of flow at the V3 segment, overall similar to the prior study from 01/26/2022. The dominant right vertebral artery is patent. Skeleton: There is no acute osseous abnormality or suspicious osseous lesion. There is no visible canal hematoma. Other neck: Soft tissues are unremarkable. Upper chest: The imaged lung apices are clear. Review of the MIP images confirms the above findings CTA HEAD FINDINGS Anterior circulation: The intracranial ICAs are patent. The bilateral MCAs are patent. The bilateral ACAs are patent. There is no aneurysm or AVM. Posterior circulation: The bilateral V4 segments are patent right larger than left.  Opacification of the left V4 segment is improved compared to the study from 01/26/2022 PICA is identified bilaterally. The basilar artery is patent The bilateral PCAs are patent. The posterior communicating arteries are not identified. Ther

## 2022-02-13 NOTE — Progress Notes (Signed)
Stroke alert cart activation at 1247 ?

## 2022-02-13 NOTE — Progress Notes (Signed)
Neurologist, Dr Bing Neighbors, joins the telemedicine cart at 1258 ?

## 2022-02-13 NOTE — H&P (Signed)
?History and Physical  ? ? ?Kristy MaidensCarla D Reeg ZOX:[id number] DOB: 1966/06/14 DOA: 02/13/2022 ? ?PCP: Kirstie PeriShah, Ashish, MD  ?Patient coming from: Home ? ?Chief Complaint: Left-sided weakness and numbness ? ?HPI: Kristy Abbott is a 56 y.o. female with medical history significant of left-sided numbness and weakness with a headache started yesterday.  [facility] was found to have an occluded left vertebral artery intervention was attempted but it was too occluded they could not intervene.  It was recommended for her to take Plavix and aspirin which she has been doing.  Yesterday she got worse again with similar symptoms from her previous stroke 2 weeks ago.  Teleneurology has been consulted recommending repeat MRI.  CTA shows no difference in her vertebral artery occlusion.  She has been getting physical therapy and Occupational Therapy at home.  Patient be referred for admission for possible new stroke. ? ? ?Review of Systems: As per HPI otherwise 10 point review of systems negative.  ? ?Past Medical History:  ?Diagnosis Date  ? Anemia   ? Graves disease   ? Hypothyroidism   ? ? ?Past Surgical History:  ?Procedure Laterality Date  ? CESAREAN SECTION    ? COLONOSCOPY N/A 11/10/2018  ? Procedure: COLONOSCOPY;  Surgeon: West BaliFields, Sandi L, MD;  Location[facility];  Service: Endoscopy;  Laterality: N/A;  1:30  ? Cyst removed from neck    ? IR ANGIO INTRA EXTRACRAN SEL COM CAROTID INNOMINATE BILAT MOD SED  01/28/2022  ? IR ANGIO VERTEBRAL SEL SUBCLAVIAN INNOMINATE UNI L MOD SED  01/28/2022  ? IR ANGIO VERTEBRAL SEL VERTEBRAL UNI R MOD SED  01/28/2022  ? IR US GUIDE VASC ACCESS RIGHT  01/28/2022  ? TOTAL ABDOMINAL HYSTERECTOMY  04/07/2019  ? Dr. Ernestina PennaNigel Buist  ? ? ? reports that she quit smoking about 23 years ago. Her smoking use included cigarettes. She started smoking about 40 years ago. She smoked an average of 1 pack per day. She has never used smokeless tobacco. She reports that she does not  drink alcohol and does not use drugs. ? ?No Known Allergies ? ?Family History  ?Problem Relation Age of Onset  ? CAD Mother   ? Diabetes Mother   ? Colon cancer Neg Hx   ? Colon polyps Neg Hx   ? ? ?Prior to Admission medications   ?Medication Sig Start Date End Date Taking? Authorizing Provider  ?acetaminophen (TYLENOL) 325 MG tablet Take 2 tablets (650 mg total) by mouth every 6 (six) hours as needed for mild pain (or Fever >/= 101). 01/30/22   Lonia BloodMcClung, Jeffrey T, MD  ?aspirin EC 81 MG EC tablet Take 1 tablet (81 mg total) by mouth daily. Swallow whole. 01/31/22   Lonia BloodMcClung, Jeffrey T, MD  ?cetirizine (ZYRTEC ALLERGY) 10 MG tablet Take 1 tablet (10 mg total) by mouth daily. 11/05/19   Durward ParcelAvegno, Komlanvi S, FNP  ?clopidogrel (PLAVIX) 75 MG tablet Take 1 tablet (75 mg total) by mouth daily. 01/31/22 05/01/22  Lonia BloodMcClung, Jeffrey T, MD  ?Ferrous Sulfate (IRON SLOW RELEASE) 142 (45 Fe) MG TBCR Take 45 mg by mouth every other day.    [provider]  ?levothyroxine (SYNTHROID, LEVOTHROID) 88 MCG tablet Take 88 mcg by mouth daily before breakfast.    [provider]  ?meclizine (ANTIVERT) 25 MG tablet Take 1 tablet (25 mg total) by mouth 3 (three) times daily as needed for dizziness. 01/23/21   Couture, Cortni S, PA-C  ?omeprazole (  PRILOSEC) 40 MG capsule Take 40 mg by mouth daily.    [provider]  ?rosuvastatin (CRESTOR) 20 MG tablet Take 1 tablet (20 mg total) by mouth daily. 01/31/22   Lonia Blood, MD  ? ? ?Physical Exam: ?Vitals:  ? 02/13/22 1500 02/13/22 1530 02/13/22 1604 02/13/22 1605  ?BP: 130/82 131/85  (!) 169/90  ?Pulse: (!) 50 (!) 55  66  ?Resp: (!) 21 19  16   ?Temp:   97.6 ?F (36.4 ?C)   ?TempSrc:   Oral   ?SpO2: 98% 99%  99%  ?Weight:      ?Height:      ? ? ? ? ?Constitutional: NAD, calm, comfortable ?Vitals:  ? 02/13/22 1500 02/13/22 1530 02/13/22 1604 02/13/22 1605  ?BP: 130/82 131/85  (!) 169/90  ?Pulse: (!) 50 (!) 55  66  ?Resp: (!) 21 19  16   ?Temp:   97.6 ?F (36.4 ?C)    ?TempSrc:   Oral   ?SpO2: 98% 99%  99%  ?Weight:      ?Height:      ? ?Eyes: PERRL, lids and conjunctivae normal ?ENMT: Mucous membranes are moist. Posterior pharynx clear of any exudate or lesions.Normal dentition.  ?Neck: normal, supple, no masses, no thyromegaly ?Respiratory: clear to auscultation bilaterally, no wheezing, no crackles. Normal respiratory effort. No accessory muscle use.  ?Cardiovascular: Regular rate and rhythm, no murmurs / rubs / gallops. No extremity edema. 2+ pedal pulses. No carotid bruits.  ?Abdomen: no tenderness, no masses palpated. No hepatosplenomegaly. Bowel sounds positive.  ?Musculoskeletal: no clubbing / cyanosis. No joint deformity upper and lower extremities. Good ROM, no contractures. Normal muscle tone.  ?Skin: no rashes, lesions, ulcers. No induration ?Neurologic: CN 2-12 grossly intact. Sensation intact, DTR normal. Strength 5/5 in all 4.  ?Psychiatric: Normal judgment and insight. Alert and oriented x 3. Normal mood.  ? ? ?Labs on Admission: I have personally reviewed following labs and imaging studies ? ?CBC: ?Recent Labs  ?Lab 02/13/22 ?1253  ?WBC 4.0  ?NEUTROABS 2.2  ?HGB 15.3*  ?HCT 45.1  ?MCV 91.7  ?PLT 249  ? ?Basic Metabolic Panel: ?Recent Labs  ?Lab 02/13/22 ?1253  ?NA 137  ?K 4.2  ?CL 104  ?CO2 25  ?GLUCOSE 96  ?BUN 15  ?CREATININE 0.63  ?CALCIUM 9.9  ? ?GFR: ?Estimated Creatinine Clearance: 75.4 mL/min (by C-G formula based on SCr of 0.63 mg/dL). ?Liver Function Tests: ?Recent Labs  ?Lab 02/13/22 ?1253  ?AST 23  ?ALT 22  ?ALKPHOS 66  ?BILITOT 0.7  ?PROT 9.1*  ?ALBUMIN 5.2*  ? ?No results for input(s): LIPASE, AMYLASE in the last 168 hours. ?No results for input(s): AMMONIA in the last 168 hours. ?Coagulation Profile: ?Recent Labs  ?Lab 02/13/22 ?1253  ?INR 0.9  ? ?Cardiac Enzymes: ?No results for input(s): CKTOTAL, CKMB, CKMBINDEX, TROPONINI in the last 168 hours. ?BNP (last 3 results) ?No results for input(s): PROBNP in the last 8760 hours. ?HbA1C: ?No results for  input(s): HGBA1C in the last 72 hours. ?CBG: ?Recent Labs  ?Lab 02/13/22 ?1249  ?GLUCAP 99  ? ?Lipid Profile: ?No results for input(s): CHOL, HDL, LDLCALC, TRIG, CHOLHDL, LDLDIRECT in the last 72 hours. ?Thyroid Function Tests: ?No results for input(s): TSH, T4TOTAL, FREET4, T3FREE, THYROIDAB in the last 72 hours. ?Anemia Panel: ?No results for input(s): VITAMINB12, FOLATE, FERRITIN, TIBC, IRON, RETICCTPCT in the last 72 hours. ?Urine analysis: ?   ?Component Value Date/Time  ? COLORURINE STRAW (A) 02/13/2022 1249  ? APPEARANCEUR CLEAR 02/13/2022 1249  ?  LABSPEC 1.029 02/13/2022 1249  ? PHURINE 6.0 02/13/2022 1249  ? GLUCOSEU NEGATIVE 02/13/2022 1249  ? HGBUR SMALL (A) 02/13/2022 1249  ? BILIRUBINUR NEGATIVE 02/13/2022 1249  ? KETONESUR NEGATIVE 02/13/2022 1249  ? PROTEINUR NEGATIVE 02/13/2022 1249  ? NITRITE NEGATIVE 02/13/2022 1249  ? LEUKOCYTESUR NEGATIVE 02/13/2022 1249  ? ?Sepsis Labs: !!!!!!!!!!!!!!!!!!!!!!!!!!!!!!!!!!!!!!!!!!!! ?@LABRCNTIP (procalcitonin:4,lacticidven:4) ?) ?Recent Results (from the past 240 hour(s))  ?Resp Panel by RT-PCR (Flu A&B, Covid) Nasopharyngeal Swab     Status: None  ? Collection Time: 02/13/22 12:49 PM  ? Specimen: Nasopharyngeal Swab; Nasopharyngeal(NP) swabs in vial transport medium  ?Result Value Ref Range Status  ? SARS Coronavirus 2 by RT PCR NEGATIVE NEGATIVE Final  ?  Comment: (NOTE) ?SARS-CoV-2 target nucleic acids are NOT DETECTED. ? ?The SARS-CoV-2 RNA is generally detectable in upper respiratory ?specimens during the acute phase of infection. The lowest ?concentration of SARS-CoV-2 viral copies this assay can detect is ?138 copies/mL. A negative result does not preclude SARS-Cov-2 ?infection and should not be used as the sole basis for treatment or ?other patient management decisions. A negative result may occur with  ?improper specimen collection/handling, submission of specimen other ?than nasopharyngeal swab, presence of viral mutation(s) within the ?areas targeted by  this assay, and inadequate number of viral ?copies(<138 copies/mL). A negative result must be combined with ?clinical observations, patient history, and epidemiological ?information. The expected result is Ne

## 2022-02-13 NOTE — ED Triage Notes (Signed)
Pt presents to ED with complaints of headache, dizziness, facial numbness, left sided blurry vision, and left sided weakness since 1830 last night. Pt with hx stroke.  ?

## 2022-02-13 NOTE — Progress Notes (Signed)
Neurologist states to take to CTA with 20G- unable to obtain 18G. Patient taken to CT department. ?

## 2022-02-13 NOTE — ED Notes (Signed)
Dr. Selina Cooley, teleneurologist reports "patient is not a candidate for anything".  ?

## 2022-02-13 NOTE — Progress Notes (Signed)
1248 call time ?1249 beeper time ?1257 exam started ?1258 exam finished ?1259 images sent to soc ?1300 exam completed in epic ?1300 [place] [facility] called ?

## 2022-02-13 NOTE — ED Notes (Addendum)
Dr. Selina Cooley notified of pt's most recent BP of 193/108. No new orders given. ?

## 2022-02-13 NOTE — Progress Notes (Signed)
Received a call from [facility]. that pt HR was in 30s. VS taken, pt HR was 47 to 49. Pt was asymptomatic. On call provider notified who stated to continue to monitor the pt. ?

## 2022-02-13 NOTE — ED Notes (Signed)
Unable to obtain 18g IV at this time. Dr. Selina Cooley aware and wants pt taken to CT now for CT Angio without perfusion.  ?

## 2022-02-13 NOTE — ED Notes (Signed)
Dr. Quinn Axe, teleneurologist notified of high BP. No new orders given at this time.  ?

## 2022-02-13 NOTE — Consult Note (Signed)
?NEUROLOGY TELECONSULTATION NOTE  ? ?Date of service: February 13, 2022 ?Patient Name: Kristy Abbott ?MRN:  [id number] ?DOB:  May 25, 1966 ?Reason for consult: telestroke ? ?Requesting Provider: Dr. Milton Ferguson ?Consult Participants: myself, patient, bedside RN, telestroke RN ?Location of the provider: [place] ?Location of the patient: [facility] ? ?This consult was provided via telemedicine with 2-way video and audio communication. The patient/family was informed that care would be provided in this way and agreed to receive care in this manner.  ? ?_ _ _   _ __   _ __ _ _  __ __   _ __   __ _ ? ?History of Present Illness  ? 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 LKW 1830 last night. NIHSS = 8 for visual field deficits (unable to differentiate between 1 and 2 fingers in medial superior and inferior quadrants of R eye or any quadrant of L eye), L facial droop, drift in LUE and BLE, L sensory deficit, and dysarthria. Patient reported that this was mostly new since yesterday but L-sided weakness on tele-exam today is consistent with most recent exam documented by PT on 02/05/22. CT head personal review NAICP. Patient is on aspirin and plavix at home. TNK not administered 2/2 presentation outside the window and recent acute infarct. CTA showed known L vert occlusion, no new LVO. ?  ?ROS  ? ?Per HPI; all other systems reviewed and are negative ? ?Past History  ? ?The following was personally reviewed: ? ?Past Medical History:  ?Diagnosis Date  ? Anemia   ? Graves disease   ? Hypothyroidism   ? ?Past Surgical History:  ?Procedure Laterality Date  ? CESAREAN SECTION    ? COLONOSCOPY N/A 11/10/2018  ? Procedure: COLONOSCOPY;  Surgeon: Danie Binder, MD;  Location: [facility];  Service: Endoscopy;  Laterality: N/A;  1:30  ? Cyst removed from neck    ? IR ANGIO INTRA EXTRACRAN SEL COM CAROTID INNOMINATE BILAT MOD SED  01/28/2022   ? IR ANGIO VERTEBRAL SEL SUBCLAVIAN INNOMINATE UNI L MOD SED  01/28/2022  ? IR ANGIO VERTEBRAL SEL VERTEBRAL UNI R MOD SED  01/28/2022  ? IR US GUIDE VASC ACCESS RIGHT  01/28/2022  ? TOTAL ABDOMINAL HYSTERECTOMY  04/07/2019  ? Dr. Gari Crown  ? ?Family History  ?Problem Relation Age of Onset  ? CAD Mother   ? Diabetes Mother   ? Colon cancer Neg Hx   ? Colon polyps Neg Hx   ? ?Social History  ? ?Socioeconomic History  ? Marital status: Married  ?  Spouse name: Not on file  ? Number of children: Not on file  ? Years of education: Not on file  ? Highest education level: Not on file  ?Occupational History  ? Not on file  ?Tobacco Use  ? Smoking status: Former  ?  Packs/day: 1.00  ?  Types: Cigarettes  ?  Start date: 06/07/1981  ?  Quit date: 10/20/1998  ?  Years since quitting: 23.3  ? Smokeless tobacco: Never  ?Vaping Use  ? Vaping Use: Never used  ?Substance and Sexual Activity  ? Alcohol use: No  ? Drug use: No  ? Sexual activity: Yes  ?Other Topics Concern  ? Not on file  ?Social History Narrative  ? 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  ? ?Social Determinants of Health  ? ?  Financial Resource Strain: Not on file  ?Food Insecurity: Not on file  ?Transportation Needs: Not on file  ?Physical Activity: Not on file  ?Stress: Not on file  ?Social Connections: Not on file  ? ?No Known Allergies ? ?Medications  ? ?(Not in a hospital admission) ?  ? ? ?Current Facility-Administered Medications:  ?  iohexol (OMNIPAQUE) 350 MG/ML injection 75 mL, 75 mL, Intravenous, Once PRN, Derek Jack, MD ? ?Current Outpatient Medications:  ?  acetaminophen (TYLENOL) 325 MG tablet, Take 2 tablets (650 mg total) by mouth every 6 (six) hours as needed for mild pain (or Fever >/= 101)., Disp: , Rfl:  ?  aspirin EC 81 MG EC tablet, Take 1 tablet (81 mg total) by mouth daily. Swallow whole., Disp: 30 tablet, Rfl: 11 ?  cetirizine (ZYRTEC ALLERGY) 10 MG tablet, Take 1 tablet (10 mg total) by  mouth daily., Disp: 30 tablet, Rfl: 0 ?  clopidogrel (PLAVIX) 75 MG tablet, Take 1 tablet (75 mg total) by mouth daily., Disp: 30 tablet, Rfl: 2 ?  Ferrous Sulfate (IRON SLOW RELEASE) 142 (45 Fe) MG TBCR, Take 45 mg by mouth every other day., Disp: , Rfl:  ?  levothyroxine (SYNTHROID, LEVOTHROID) 88 MCG tablet, Take 88 mcg by mouth daily before breakfast., Disp: , Rfl:  ?  meclizine (ANTIVERT) 25 MG tablet, Take 1 tablet (25 mg total) by mouth 3 (three) times daily as needed for dizziness., Disp: 15 tablet, Rfl: 0 ?  omeprazole (PRILOSEC) 40 MG capsule, Take 40 mg by mouth daily., Disp: , Rfl:  ?  rosuvastatin (CRESTOR) 20 MG tablet, Take 1 tablet (20 mg total) by mouth daily., Disp: 30 tablet, Rfl: 1 ? ?Vitals  ? ?Vitals:  ? 02/13/22 1255 02/13/22 1300 02/13/22 1301 02/13/22 1305  ?BP: (!) 191/97 (!) 168/117  (!) 203/110  ?Pulse: 62 60  63  ?Resp: (!) 24 (!) 24  13  ?Temp:      ?TempSrc:      ?SpO2: 98% 98%  100%  ?Weight:   71.7 kg   ?Height:      ?  ? ?Body mass index is 28.91 kg/m?. ? ?Physical Exam  ? ?Exam performed over telemedicine with 2-way video and audio communication and with assistance of bedside RN ? ?Physical Exam ?Gen: A&O x4, NAD ?Resp: normal WOB ?CV: extremities appear well-perfused ? ?Neuro: ?*MS: A&O x4. Follows simple commands. ?*Speech: mild dysarthria, no aphasia, able to name and repeat ?*CN: PERRL 40mm, EOMI, unable to differentiate between 1 and 2 fingers in medial upper or lower quadrant in R eye, or any quadrant in L eye, sensation impaired to LT L face, L UMN facial droop, hearing intact to voice ?*Motor:   Normal bulk.  No tremor, rigidity or bradykinesia. RUE full strength, drift but not to bed in LUE and BLE ?*Sensory: Impaired to LT in LUE and LLE. Symmetric. No double-simultaneous extinction.  ?*Coordination:  FNF bradykinetic but without ataxia bilat ?*Reflexes:  UTA 2/2 tele-exam ?*Gait: deferred ? ?NIHSS ? ?1a Level of Conscious.: 0 ?1b LOC Questions: 0 ?1c LOC Commands: 0 ?2  Best Gaze: 0 ?3 Visual: 2 ?4 Facial Palsy: 1 ?5a Motor Arm - left: 1 ?5b Motor Arm - Right: 0 ?6a Motor Leg - Left: 1 ?6b Motor Leg - Right: 1 ?7 Limb Ataxia: 0 ?8 Sensory: 1 ?9 Best Language: 0 ?10 Dysarthria: 1 ?11 Extinct. and Inatten.: 0 ? ?TOTAL: 8 ? ? ?Premorbid mRS = 1 ? ? ?Labs  ? ?CBC:  ?  Recent Labs  ?Lab 02/13/22 ?1253  ?WBC 4.0  ?NEUTROABS 2.2  ?HGB 15.3*  ?HCT 45.1  ?MCV 91.7  ?PLT 249  ? ? ?Basic Metabolic Panel:  ?Lab Results  ?Component Value Date  ? NA 141 01/27/2022  ? K 3.7 01/27/2022  ? CO2 23 01/27/2022  ? GLUCOSE 88 01/27/2022  ? BUN 13 01/27/2022  ? CREATININE 0.60 01/27/2022  ? CALCIUM 9.2 01/27/2022  ? GFRNONAA >60 01/27/2022  ? GFRAA >90 01/19/2014  ? ?Lipid Panel:  ?Lab Results  ?Component Value Date  ? [place] 88 01/27/2022  ? ?HgbA1c:  ?Lab Results  ?Component Value Date  ? HGBA1C 4.9 01/27/2022  ? ?Urine Drug Screen:  ?   ?Component Value Date/Time  ? LABOPIA NONE DETECTED 01/26/2022 1559  ? COCAINSCRNUR NONE DETECTED 01/26/2022 1559  ? LABBENZ NONE DETECTED 01/26/2022 1559  ? AMPHETMU NONE DETECTED 01/26/2022 1559  ? THCU NONE DETECTED 01/26/2022 1559  ? LABBARB NONE DETECTED 01/26/2022 1559  ?  ?Alcohol Level  ?   ?Component Value Date/Time  ? ETH <10 01/26/2022 2304  ? ? ?Impression  ? 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 LKW 1830 last night. NIHSS = 8. CT head personal review NAICP. CTA showed known L vert occlusion, no new LVO. ? ?TNK not administered 2/2 presentation outside the window and recent acute infarct. Patient is on aspirin and plavix at home.  ? ?Recommendations  ? ?- Admit obs to hospitalist service for stroke workup ?- Permissive HTN x48 hrs from sx onset or until stroke ruled out by MRI goal BP <220/110. PRN labetalol or hydralazine if BP above these parameters. Avoid oral antihypertensives. ?- MRI brain wo contrast ?- Vascular imaging completed ?- TTE  performed 4/11, no need to repeat ?- ASA 81mg  daily + plavix 75mg  daily x90 days f/b ASA 81mg  daily monotherapy after that ?- No indication for statin, patient is at goal without ?- q4 hr neuro checks ?- S

## 2022-02-13 NOTE — ED Notes (Signed)
Pt back from MRI and transported to 3 floor room 341. ?

## 2022-02-13 NOTE — Progress Notes (Signed)
Patient to go for stat CTA per Neurologist- delay for 18G IV access. ?

## 2022-02-13 NOTE — Progress Notes (Signed)
Patient again back from CT department  ?

## 2022-02-13 NOTE — ED Notes (Signed)
Pt to MRI

## 2022-02-14 DIAGNOSIS — I639 Cerebral infarction, unspecified: Secondary | ICD-10-CM | POA: Diagnosis not present

## 2022-02-14 DIAGNOSIS — Z20822 Contact with and (suspected) exposure to covid-19: Secondary | ICD-10-CM | POA: Diagnosis not present

## 2022-02-14 DIAGNOSIS — E039 Hypothyroidism, unspecified: Secondary | ICD-10-CM | POA: Diagnosis not present

## 2022-02-14 DIAGNOSIS — I63219 Cerebral infarction due to unspecified occlusion or stenosis of unspecified vertebral arteries: Secondary | ICD-10-CM | POA: Diagnosis not present

## 2022-02-14 DIAGNOSIS — Z87891 Personal history of nicotine dependence: Secondary | ICD-10-CM | POA: Diagnosis not present

## 2022-02-14 NOTE — TOC Progression Note (Signed)
?  Transition of Care (TOC) Screening Note ? ? ?Patient Details  ?Name: Kristy Abbott ?Date of Birth: [birth year] ? ? ?Transition of Care (TOC) CM/SW Contact:    ?Elliot Gault, LCSW ?Phone Number: ?02/14/2022, 11:29 AM ? ? ? ?PT/OT recommend pt continue with the outpatient therapy she has been receiving. ? ?Transition of Care Department [facility]) has reviewed patient and no TOC needs have been identified at this time. We will continue to monitor patient advancement through interdisciplinary progression rounds. If new patient transition needs arise, please place a TOC consult. ? ? ?

## 2022-02-14 NOTE — Evaluation (Signed)
Occupational Therapy Evaluation ?Patient Details ?Name: Kristy Abbott ?MRN: [id number] ?DOB: 07-26-1966 ?Today's Date: 02/14/2022 ? ? ?History of Present Illness Kristy Abbott is a 56 y.o. female with medical history significant of left-sided numbness and weakness with a headache started yesterday.  Patient was admitted 2 weeks ago at [facility] was found to have an occluded left vertebral artery intervention was attempted but it was too occluded they could not intervene.  It was recommended for her to take Plavix and aspirin which she has been doing.  Yesterday she got worse again with similar symptoms from her previous stroke 2 weeks ago.  Teleneurology has been consulted recommending repeat MRI.  CTA shows no difference in her vertebral artery occlusion.  She has been getting physical therapy and Occupational Therapy at home.  Patient be referred for admission for possible new stroke. (Per MD)  ? ?Clinical Impression ?  ?Pt. Agreeable to OT/PT eval. Pt. Reports that she is felling back to baseline (how she has been feeling "since her first mini stroke"). She lives at home with her husband, he is able to provide 24/7 assist/support if needed. Pt. Completed functional mobility around room/hallway/stairwell independently + cane. Demonstrated ability to wash hands at sink level independently. OT discussed returning to outpatient OT/PT when discharged. PT. Does not require any further acute OT needs at this time.  ?   ? ?Recommendations for follow up therapy are one component of a multi-disciplinary discharge planning process, led by the attending physician.  Recommendations may be updated based on patient status, additional functional criteria and insurance authorization.  ? ?Follow Up Recommendations ? Outpatient OT (Continue outpatinet OT/PT that she is currently recieving.)  ?  ?Assistance Recommended at Discharge Set up Supervision/Assistance  ?Patient can return home with the following Assistance with  cooking/housework;Assist for transportation ? ?  ?Functional Status Assessment ? Patient has had a recent decline in their functional status and demonstrates the ability to make significant improvements in function in a reasonable and predictable amount of time.  ?Equipment Recommendations ?    ?  ?   ?Precautions / Restrictions Precautions ?Precautions: Fall ?Restrictions ?Weight Bearing Restrictions: No  ? ?  ? ?Mobility Bed Mobility ?Overal bed mobility: Independent ?Bed Mobility: Supine to Sit, Sit to Supine ?  ?  ?Supine to sit: Independent ?Sit to supine: Independent ?  ?General bed mobility comments: Increased time ?  ? ?Transfers ?Overall transfer level: Independent ?Equipment used: None ?Transfers: Sit to/from Stand ?Sit to Stand: Independent ?  ?  ?  ?  ?  ?General transfer comment: SBA ?  ? ?  ?Balance Overall balance assessment: Modified Independent ?Sitting-balance support: Feet supported, No upper extremity supported ?Sitting balance-Leahy Scale: Good ?  ?  ?Standing balance support: No upper extremity supported ?Standing balance-Leahy Scale: Good ?  ?  ?  ?  ?  ?  ?  ?  ?  ?  ?  ?  ?   ? ?ADL either performed or assessed with clinical judgement  ? ?ADL Overall ADL's : Needs assistance/impaired ?Eating/Feeding: Set up;Sitting ?  ?Grooming: Wash/dry hands;Min guard ?  ?Upper Body Bathing: Sitting ?  ?Lower Body Bathing: Min guard;Supervison/ safety ?  ?Upper Body Dressing : Independent ?  ?Lower Body Dressing: Minimal assistance ?  ?Toilet Transfer: Supervision/safety ?  ?Toileting- Clothing Manipulation and Hygiene: Supervision/safety ?  ?Tub/ Shower Transfer: Supervision/safety ?  ?Functional mobility during ADLs: Modified independent;Cane ?General ADL Comments: Completed hand washing at sink with SBA  ? ? ? ?  Vision Baseline Vision/History: 1 Wears glasses ?Ability to See in Adequate Light: 0 Adequate ?Patient Visual Report: Diplopia (Reports interminttent diplopia) ?   ?   ?   ?  ?   ?  ? ?Pertinent  Vitals/Pain Pain Assessment ?Pain Assessment: 0-10 ?Pain Score: 7  ?Pain Descriptors / Indicators: Grimacing, Discomfort, Sore ?Pain Intervention(s): Limited activity within patient's tolerance, Monitored during session  ? ? ? ?Hand Dominance Right ?  ?Extremity/Trunk Assessment Upper Extremity Assessment ?Upper Extremity Assessment: Overall WFL for tasks assessed;LUE deficits/detail (L UE 10 degrees less of ROM verses RU [facility]). LUE 3+/5 shoulder flexion. LUE slighlty weaker than RUE.) ?LUE Deficits / Details: Decreased strength grossly and at grasp. ?LUE Coordination: WNL ?  ?Lower Extremity Assessment ?Lower Extremity Assessment: Defer to PT evaluation ?  ?Cervical / Trunk Assessment ?Cervical / Trunk Assessment: Normal ?  ?Communication Communication ?Communication: No difficulties ?  ?Cognition Arousal/Alertness: Awake/alert ?Behavior During Therapy: [facility] for tasks assessed/performed ?Overall Cognitive Status: Within Functional Limits for tasks assessed ?  ?  ?  ?  ?  ?  ?  ?  ?  ?  ?  ?  ?  ?  ?  ?  ?  ?  ?  ?   ? ?  ?Exercises   ?  ?Shoulder Instructions    ? ? ?Home Living Family/patient expects to be discharged to:: Private residence ?Living Arrangements: Spouse/significant other ?Available Help at Discharge: Family;Available 24 hours/day ?Type of Home: House ?Home Access: Stairs to enter ?Entrance Stairs-Number of Steps: 3 ?Entrance Stairs-Rails: Right ?Home Layout: One level ?  ?  ?Bathroom Shower/Tub: Tub/shower unit ?  ?Bathroom Toilet: Standard ?Bathroom Accessibility: Yes ?How Accessible: Accessible via walker ?Home Equipment: Gil[place] Mor - single point ?  ?  ? Lives With: Spouse ? ?  ?Prior Functioning/Environment Prior Level of Function : Independent/Modified Independent;Working/employed;Driving ?  ?  ?  ?  ?  ?  ?  ?ADLs Comments: Does not drive- per husband she has neber drove. He provides all transportation ?  ? ?  ?  ?   ?  ?   ?    ?  ?OT Goals(Current goals can be found in the care plan section)    ?OT  Frequency:   ?  ? ?Co-evaluation PT/OT/SLP Co-Evaluation/Treatment: Yes ?Reason for Co-Treatment: To address functional/ADL transfers ?  ?OT goals addressed during session: ADL's and self-care ?  ? ?  ?AM-PAC OT "6 Clicks" Daily Activity     ?Outcome Measure Help from another person eating meals?: None ?Help from another person taking care of personal grooming?: None ?Help from another person toileting, which includes using toliet, bedpan, or urinal?: None ?Help from another person bathing (including washing, rinsing, drying)?: None ?Help from another person to put on and taking off regular upper body clothing?: None ?Help from another person to put on and taking off regular lower body clothing?: None ?6 Click Score: 24 ?  ?End of Session Equipment Utilized During Treatment: Gait belt (cane) ?Nurse Communication: Mobility status ? ?Activity Tolerance: Patient tolerated treatment well;Patient limited by pain ?Patient left: in bed;with call bell/phone within reach;with bed alarm set;with family/visitor present ? ?   ?              ?Time: 8921-1941 ?OT Time Calculation (min): 25 min ?Charges:  OT General Charges ?$OT Visit: 1 Visit ?OT Evaluation ?$OT Eval Low Complexity: 1 Low ? ?Lurena Joiner, OTR/L ? ? ?Bevelyn Ngo ?02/14/2022, 10:14 AM ?

## 2022-02-14 NOTE — Progress Notes (Signed)
Nsg Discharge Note ? ?Admit Date:  02/13/2022 ?Discharge date: 02/14/2022 ?  ?Kristy Abbott to be D/C'd Home per MD order.  AVS completed. ?Patient/caregiver able to verbalize understanding. ? ?Discharge Medication: ?Allergies as of 02/14/2022   ?No Known Allergies ?  ? ?  ?Medication List  ?  ? ?TAKE these medications   ? ?acetaminophen 325 MG tablet ?Commonly known as: TYLENOL ?Take 2 tablets (650 mg total) by mouth every 6 (six) hours as needed for mild pain (or Fever >/= 101). ?  ?aspirin 81 MG EC tablet ?Take 1 tablet (81 mg total) by mouth daily. Swallow whole. ?  ?cetirizine 10 MG tablet ?Commonly known as: ZyrTEC Allergy ?Take 1 tablet (10 mg total) by mouth daily. ?  ?clopidogrel 75 MG tablet ?Commonly known as: PLAVIX ?Take 1 tablet (75 mg total) by mouth daily. ?  ?Ferrous Sulfate 142 (45 Fe) MG Tbcr ?Take 45 mg by mouth every other day. ?  ?levothyroxine 88 MCG tablet ?Commonly known as: SYNTHROID ?Take 88 mcg by mouth daily before breakfast. ?  ?meclizine 25 MG tablet ?Commonly known as: ANTIVERT ?Take 1 tablet (25 mg total) by mouth 3 (three) times daily as needed for dizziness. ?  ?omeprazole 40 MG capsule ?Commonly known as: PRILOSEC ?Take 40 mg by mouth daily. ?  ?rosuvastatin 20 MG tablet ?Commonly known as: CRESTOR ?Take 1 tablet (20 mg total) by mouth daily. ?  ? ?  ? ? ?Discharge Assessment: ?Vitals:  ? 02/14/22 0140 02/14/22 0359  ?BP: 106/74 102/72  ?Pulse: (!) 50 (!) 47  ?Resp: 15 16  ?Temp: 98 ?F (36.7 ?C) 98 ?F (36.7 ?C)  ?SpO2: 99% 99%  ?Skin clean, dry and intact without evidence of skin break down, no evidence of skin tears noted. ?IV catheter discontinued intact. Site without signs and symptoms of complications - no redness or edema noted at insertion site, patient denies c/o pain - only slight tenderness at site.  Dressing with slight pressure applied. ? ?D/c Instructions-Education: ?Discharge instructions given to patient/family with verbalized understanding. ?D/c education completed  with patient/family including follow up instructions, medication list, d/c activities limitations if indicated, with other d/c instructions as indicated by MD - patient able to verbalize understanding, all questions fully answered. ?Patient instructed to return to ED, call 911, or call MD for any changes in condition.  ?Patient escorted via WC, and D/C home via private auto. ? ?Luther Bradley, RN ?02/14/2022 11:43 AM   ?

## 2022-02-14 NOTE — Progress Notes (Signed)
?   02/13/22 2347  ?Assess: MEWS Score  ?Temp (!) 97.4 ?F (36.3 ?C)  ?BP 95/66  ?Pulse Rate (!) 43  ?Resp 16  ?Level of Consciousness Alert  ?SpO2 99 %  ?O2 Device Room Air  ?Assess: MEWS Score  ?MEWS Temp 0  ?MEWS Systolic 1  ?MEWS Pulse 1  ?MEWS RR 0  ?MEWS LOC 0  ?MEWS Score 2  ?MEWS Score Color Yellow  ?Assess: if the MEWS score is Yellow or Red  ?Were vital signs taken at a resting state? Yes  ?Focused Assessment Change from prior assessment (see assessment flowsheet)  ?Early Detection of Sepsis Score *See Row Information* Low  ?MEWS guidelines implemented *See Row Information* Yes  ?Treat  ?Pain Scale 0-10  ?Pain Score 0  ?Take Vital Signs  ?Increase Vital Sign Frequency  Yellow: Q 2hr X 2 then Q 4hr X 2, if remains yellow, continue Q 4hrs  ?Escalate  ?MEWS: Escalate Yellow: discuss with charge nurse/RN and consider discussing with provider and RRT  ?Notify: Charge Nurse/RN  ?Name of Charge Nurse/RN Notified Asher Muir, RN  ?Date Charge Nurse/RN Notified 02/13/22  ?Time Charge Nurse/RN Notified 2350  ?Notify: Provider  ?Provider Name/Title Carren Rang, MD  ?Date Provider Notified 02/13/22  ?Time Provider Notified 2350  ?Notification Type Page  ?Notification Reason Other (Comment) ?(Yellow MEWS; HR 43)  ?Provider response Evaluate remotely;See new orders  ?Date of Provider Response 02/13/22  ?Time of Provider Response 2350  ? ? ?

## 2022-02-14 NOTE — Discharge Summary (Signed)
Physician Discharge Summary  ?Kristy Abbott JEH:[id number] DOB: 02-12-1966 DOA: 02/13/2022 ? ?PCP: [name], MD ? ?Admit date: 02/13/2022 ?Discharge date: 02/14/2022 ? ?Time spent: 40 minutes ? ?Recommendations for Outpatient Follow-up:  ?Follow-up with primary care physician in 1 to 2 weeks ?Follow-up with neurologist as scheduled by neurology team ? ?Discharge Diagnoses:  ?Principal Problem: ?  CVA (cerebral vascular accident) (HCC) ?Active Problems: ?  Vertebral artery occlusion ? ? ?Discharge Condition: Stable ? ? ?Filed Weights  ? 02/13/22 1301  ?Weight: 71.7 kg  ? ? ?History of present illness:  ?Kristy Abbott is a 56 y.o. female with medical history significant of left-sided numbness and weakness with a headache started yesterday.  Patient was admitted 2 weeks ago at [facility] was found to have an occluded left vertebral artery intervention was attempted but it was too occluded they could not intervene.  It was recommended for her to take Plavix and aspirin which she has been doing.  Yesterday she got worse again with similar symptoms from her previous stroke 2 weeks ago.  Teleneurology has been consulted recommending repeat MRI.  CTA shows no difference in her vertebral artery occlusion.  She has been getting physical therapy and Occupational Therapy at home.  Patient be referred for admission for possible new stroke. ? ?Hospital Course:  ? ?56 year old female with left vertebral artery occlusion comes in with strokelike symptoms status post stroke 2 weeks ago patient was then admitted had a repeat MRI which did not show any acute infarct.  It was recommended for patient to continue Plavix and aspirin by telemetry neurology recommendations and consultation.  Please see all imaging reports and teleneurology consult notes for full details.  There has been no change in patient medication regimen.  She is to follow-up with primary care physician in 1 to 2 weeks and with neurologist in 2 weeks.  Patient is  being discharged in stable condition at her baseline.  Continue with physical therapy and home health. ?   ? ? ? ?Discharge Exam: ?Vitals:  ? 02/14/22 0140 02/14/22 0359  ?BP: 106/74 102/72  ?Pulse: (!) 50 (!) 47  ?Resp: 15 16  ?Temp: 98 ?F (36.7 ?C) 98 ?F (36.7 ?C)  ?SpO2: 99% 99%  ? ? ?General: Alert and oriented no apparent distress ?Cardiovascular: Regular rate and rhythm without murmurs rubs or gallops ?Respiratory: Clear to auscultation bilaterally no wheezes rhonchi rales ? ?Discharge Instructions ? ? ?Discharge Instructions   ? ? Diet - low sodium heart healthy   Complete by: As directed ?  ? Discharge instructions   Complete by: As directed ?  ? Follow-up with primary care physician in 1 to 2 weeks ? ?Follow-up with neurology clinic as scheduled with neurology service  ? Increase activity slowly   Complete by: As directed ?  ? ?  ? ?Allergies as of 02/14/2022   ?No Known Allergies ?  ? ?  ?Medication List  ?  ? ?TAKE these medications   ? ?acetaminophen 325 MG tablet ?Commonly known as: TYLENOL ?Take 2 tablets (650 mg total) by mouth every 6 (six) hours as needed for mild pain (or Fever >/= 101). ?  ?aspirin 81 MG EC tablet ?Take 1 tablet (81 mg total) by mouth daily. Swallow whole. ?  ?cetirizine 10 MG tablet ?Commonly known as: ZyrTEC Allergy ?Take 1 tablet (10 mg total) by mouth daily. ?  ?clopidogrel 75 MG tablet ?Commonly known as: PLAVIX ?Take 1 tablet (75 mg total) by mouth daily. ?  ?  Ferrous Sulfate 142 (45 Fe) MG Tbcr ?Take 45 mg by mouth every other day. ?  ?levothyroxine 88 MCG tablet ?Commonly known as: SYNTHROID ?Take 88 mcg by mouth daily before breakfast. ?  ?meclizine 25 MG tablet ?Commonly known as: ANTIVERT ?Take 1 tablet (25 mg total) by mouth 3 (three) times daily as needed for dizziness. ?  ?omeprazole 40 MG capsule ?Commonly known as: PRILOSEC ?Take 40 mg by mouth daily. ?  ?rosuvastatin 20 MG tablet ?Commonly known as: CRESTOR ?Take 1 tablet (20 mg total) by mouth daily. ?  ? ?  ? ?No  Known Allergies ? ? ? ?The results of significant diagnostics from this hospitalization (including imaging, microbiology, ancillary and laboratory) are listed below for reference.   ? ?Significant Diagnostic Studies: ?CT Angio Head W or Wo Contrast ? ?Result Date: 01/26/2022 ?CLINICAL DATA:  Dizziness and nausea EXAM: CT ANGIOGRAPHY HEAD AND NECK TECHNIQUE: Multidetector CT imaging of the head and neck was performed using the standard protocol during bolus administration of intravenous contrast. Multiplanar CT image reconstructions and MIPs were obtained to evaluate the vascular anatomy. Carotid stenosis measurements (when applicable) are obtained utilizing NASCET criteria, using the distal internal carotid diameter as the denominator. RADIATION DOSE REDUCTION: This exam was performed according to the departmental dose-optimization program which includes automated exposure control, adjustment of the mA and/or kV according to patient size and/or use of iterative reconstruction technique. CONTRAST:  35mL OMNIPAQUE IOHEXOL 350 MG/ML SOLN COMPARISON:  01/23/2021 FINDINGS: CTA NECK FINDINGS SKELETON: There is no bony spinal canal stenosis. No lytic or blastic lesion. OTHER NECK: Normal pharynx, larynx and major salivary glands. No cervical lymphadenopathy. Unremarkable thyroid gland. UPPER CHEST: No pneumothorax or pleural effusion. No nodules or masses. AORTIC ARCH: There is no calcific atherosclerosis of the aortic arch. There is no aneurysm, dissection or hemodynamically significant stenosis of the visualized portion of the aorta. Conventional 3 vessel aortic branching pattern. The visualized proximal subclavian arteries are widely patent. RIGHT CAROTID SYSTEM: Normal without aneurysm, dissection or stenosis. LEFT CAROTID SYSTEM: Normal without aneurysm, dissection or stenosis. VERTEBRAL ARTERIES: Codominant configuration. Long segment narrowing and multifocal complete loss of opacification of the left vertebral artery  which is new since 01/25/2021 and likely indicates dissection. Right vertebral artery is normal. CTA HEAD FINDINGS POSTERIOR CIRCULATION: --Vertebral arteries: Limited opacification of the left V4 segment. Right is normal. --Inferior cerebellar arteries: Both PICA are opacified. --Basilar artery: Normal. --Superior cerebellar arteries: Normal. --Posterior cerebral arteries (PCA): Normal. ANTERIOR CIRCULATION: --Intracranial internal carotid arteries: Normal. --Anterior cerebral arteries (ACA): Normal. Both A1 segments are present. Patent anterior communicating artery (a-comm). --Middle cerebral arteries (MCA): Normal. VENOUS SINUSES: As permitted by contrast timing, patent. ANATOMIC VARIANTS: None Review of the MIP images confirms the above findings. IMPRESSION: 1. Long segment narrowing and multifocal complete loss of opacification of the left vertebral artery, new since 01/23/2021, consistent with dissection. Consultation for possible intervention is recommended. 2. Intracranial and cervical arteries otherwise normal. Critical Value/emergent results were called by telephone at the time of interpretation on 01/26/2022 at 9:32 pm to provider Mertha Baars , who verbally acknowledged these results. Electronically Signed   By: Deatra Robinson M.D.   On: 01/26/2022 21:33  ? ?CT ANGIO HEAD NECK W WO CM ? ?Result Date: 02/13/2022 ?CLINICAL DATA:  Headache, dizziness, facial numbness, left-sided blurry vision and left-sided weakness since 6:30 p.m. last night. History of stroke EXAM: CT ANGIOGRAPHY HEAD AND NECK TECHNIQUE: Multidetector CT imaging of the head and neck was performed using the  standard protocol during bolus administration of intravenous contrast. Multiplanar CT image reconstructions and MIPs were obtained to evaluate the vascular anatomy. Carotid stenosis measurements (when applicable) are obtained utilizing NASCET criteria, using the distal internal carotid diameter as the denominator. RADIATION DOSE REDUCTION:  This exam was performed according to the departmental dose-optimization program which includes automated exposure control, adjustment of the mA and/or kV according to patient size and/or use of iterat

## 2022-02-14 NOTE — Progress Notes (Signed)
SLP Cancellation Note ? ?Patient Details ?Name: Kristy Abbott ?MRN: [id number] ?DOB: 04/29/1966 ? ? ?Cancelled treatment:       Reason Eval/Treat Not Completed: SLP screened, no needs identified, will sign off ? ? ?Tressie Stalker, M.S., CCC-SLP ?02/14/2022, 12:01 PM ?

## 2022-02-14 NOTE — Evaluation (Signed)
Physical Therapy Evaluation ?Patient Details ?Name: Kristy Abbott ?MRN: [id number] ?DOB: [birth year] ?Today's Date: 02/14/2022 ? ?History of Present Illness ? Kristy Abbott is a 56 y.o. female with medical history significant of left-sided numbness and weakness with a headache started yesterday.  Patient was admitted 2 weeks ago at [facility] was found to have an occluded left vertebral artery intervention was attempted but it was too occluded they could not intervene.  It was recommended for her to take Plavix and aspirin which she has been doing.  Yesterday she got worse again with similar symptoms from her previous stroke 2 weeks ago.  Teleneurology has been consulted recommending repeat MRI.  CTA shows no difference in her vertebral artery occlusion.  She has been getting physical therapy and Occupational Therapy at home.  Patient be referred for admission for possible new stroke. ?  ?Clinical Impression ? Patient functioning near baseline for functional mobility and gait demonstrating good return for ambulation in room, hallway and on stairs without loss of balance.  Plan:  Patient discharged from physical therapy to care of nursing for ambulation daily as tolerated for length of stay.  ?   ?   ? ?Recommendations for follow up therapy are one component of a multi-disciplinary discharge planning process, led by the attending physician.  Recommendations may be updated based on patient status, additional functional criteria and insurance authorization. ? ?Follow Up Recommendations Outpatient PT ? ?  ?Assistance Recommended at Discharge PRN  ?Patient can return home with the following ? Help with stairs or ramp for entrance;A little help with walking and/or transfers;A little help with bathing/dressing/bathroom;Assistance with cooking/housework ? ?  ?Equipment Recommendations None recommended by PT  ?Recommendations for Other Services ?    ?  ?Functional Status Assessment Patient has had a recent decline in their  functional status and demonstrates the ability to make significant improvements in function in a reasonable and predictable amount of time.  ? ?  ?Precautions / Restrictions Precautions ?Precautions: Fall ?Restrictions ?Weight Bearing Restrictions: No  ? ?  ? ?Mobility ? Bed Mobility ?Overal bed mobility: Independent ?  ?  ?  ?  ?  ?  ?  ?  ? ?Transfers ?Overall transfer level: Modified independent ?  ?  ?  ?  ?  ?  ?  ?  ?General transfer comment: using SPC ?  ? ?Ambulation/Gait ?Ambulation/Gait assistance: Modified independent (Device/Increase time) ?Gait Distance (Feet): 100 Feet ?Assistive device: Straight cane ?Gait Pattern/deviations: Step-to pattern, Decreased step length - left, Decreased stance time - right, Decreased stride length ?Gait velocity: decreased ?  ?  ?General Gait Details: good return for using SPC with mostly step-to pattern without loss of balance during ambulation in room and hallways ? ?Stairs ?Stairs: Yes ?Stairs assistance: Supervision, Modified independent (Device/Increase time) ?Stair Management: One rail Right, With cane ?Number of Stairs: 3 ?General stair comments: demonstrates good return for going up/down 3 steps using 1 siderail and SPC without loss of balance ? ?Wheelchair Mobility ?  ? ?Modified Rankin (Stroke Patients Only) ?  ? ?  ? ?Balance Overall balance assessment: Needs assistance ?Sitting-balance support: Feet supported, No upper extremity supported ?Sitting balance-Leahy Scale: Good ?Sitting balance - Comments: seated at EOB ?  ?Standing balance support: During functional activity, No upper extremity supported ?Standing balance-Leahy Scale: Poor ?Standing balance comment: fair/good using SPC ?  ?  ?  ?  ?  ?  ?  ?  ?  ?  ?  ?   ? ? ? ?  Pertinent Vitals/Pain Pain Assessment ?Pain Assessment: 0-10 ?Pain Score: 7  ?Pain Location: L neck/shoulder ?Pain Descriptors / Indicators: Grimacing, Discomfort, Sore ?Pain Intervention(s): Limited activity within patient's tolerance,  Monitored during session  ? ? ?Home Living Family/patient expects to be discharged to:: Private residence ?Living Arrangements: Spouse/significant other ?Available Help at Discharge: Family;Available 24 hours/day ?Type of Home: House ?Home Access: Stairs to enter ?Entrance Stairs-Rails: Right ?Entrance Stairs-Number of Steps: 3 ?  ?Home Layout: One level ?Home Equipment: Kasandra Knudsen - single point ?   ?  ?Prior Function Prior Level of Function : Independent/Modified Independent;Working/employed;Driving ?  ?  ?  ?  ?  ?  ?  ?ADLs Comments: Does not drive- per husband she has neber drove. He provides all transportation ?  ? ? ?Hand Dominance  ? Dominant Hand: Right ? ?  ?Extremity/Trunk Assessment  ? Upper Extremity Assessment ?Upper Extremity Assessment: Defer to OT evaluation ?LUE Deficits / Details: Decreased strength grossly and at grasp. ?LUE Coordination: WNL ?  ? ?Lower Extremity Assessment ?Lower Extremity Assessment: Overall WFL for tasks assessed;LLE deficits/detail ?LLE Deficits / Details: 4/5 grossly graded ?LLE Sensation: decreased light touch;decreased proprioception ?LLE Coordination: decreased gross motor;decreased fine motor ?  ? ?Cervical / Trunk Assessment ?Cervical / Trunk Assessment: Normal  ?Communication  ? Communication: No difficulties  ?Cognition Arousal/Alertness: Awake/alert ?Behavior During Therapy: [facility] for tasks assessed/performed ?Overall Cognitive Status: Within Functional Limits for tasks assessed ?  ?  ?  ?  ?  ?  ?  ?  ?  ?  ?  ?  ?  ?  ?  ?  ?  ?  ?  ? ?  ?General Comments   ? ?  ?Exercises    ? ?Assessment/Plan  ?  ?PT Assessment All further PT needs can be met in the next venue of care  ?PT Problem List Decreased strength;Decreased activity tolerance;Decreased balance;Decreased mobility ? ?   ?  ?PT Treatment Interventions     ? ?PT Goals (Current goals can be found in the Care Plan section)  ?Acute Rehab PT Goals ?Patient Stated Goal: return home with family to assist ?PT Goal  Formulation: With patient/family ?Time For Goal Achievement: 02/14/22 ?Potential to Achieve Goals: Good ? ?  ?Frequency   ?  ? ? ?Co-evaluation PT/OT/SLP Co-Evaluation/Treatment: Yes ?Reason for Co-Treatment: To address functional/ADL transfers ?PT goals addressed during session: Mobility/safety with mobility;Balance;Proper use of DME ?OT goals addressed during session: ADL's and self-care ?  ? ? ?  ?AM-PAC PT "6 Clicks" Mobility  ?Outcome Measure Help needed turning from your back to your side while in a flat bed without using bedrails?: None ?Help needed moving from lying on your back to sitting on the side of a flat bed without using bedrails?: None ?Help needed moving to and from a bed to a chair (including a wheelchair)?: A Little ?Help needed standing up from a chair using your arms (e.g., wheelchair or bedside chair)?: A Little ?Help needed to walk in hospital room?: A Little ?Help needed climbing 3-5 steps with a railing? : A Little ?6 Click Score: 20 ? ?  ?End of Session Equipment Utilized During Treatment: Gait belt ?Activity Tolerance: Patient tolerated treatment well;Patient limited by fatigue ?Patient left: in bed;with call bell/phone within reach;with family/visitor present ?Nurse Communication: Mobility status ?PT Visit Diagnosis: Unsteadiness on feet (R26.81);Other abnormalities of gait and mobility (R26.89);Muscle weakness (generalized) (M62.81) ?  ? ?Time: 1497-0263 ?PT Time Calculation (min) (ACUTE ONLY): 24 min ? ? ?Charges:  PT Evaluation ?$PT Eval Moderate Complexity: 1 Mod ?PT Treatments ?$Therapeutic Activity: 23-37 mins ?  ?   ? ? ?12:48 PM, 02/14/22 ?Lonell Grandchild, MPT ?Physical Therapist with [place] ?[facility] ?[phone] office ?[phone] mobile phone ? ? ?

## 2022-02-17 ENCOUNTER — Ambulatory Visit: Payer: 59 | Attending: Internal Medicine | Admitting: Occupational Therapy

## 2022-02-17 ENCOUNTER — Ambulatory Visit: Payer: 59 | Admitting: Physical Therapy

## 2022-02-17 DIAGNOSIS — M6281 Muscle weakness (generalized): Secondary | ICD-10-CM

## 2022-02-17 DIAGNOSIS — R278 Other lack of coordination: Secondary | ICD-10-CM | POA: Diagnosis present

## 2022-02-17 DIAGNOSIS — R208 Other disturbances of skin sensation: Secondary | ICD-10-CM | POA: Insufficient documentation

## 2022-02-17 DIAGNOSIS — M25612 Stiffness of left shoulder, not elsewhere classified: Secondary | ICD-10-CM | POA: Diagnosis present

## 2022-02-17 DIAGNOSIS — R2681 Unsteadiness on feet: Secondary | ICD-10-CM | POA: Diagnosis present

## 2022-02-17 DIAGNOSIS — R2689 Other abnormalities of gait and mobility: Secondary | ICD-10-CM | POA: Diagnosis present

## 2022-02-17 DIAGNOSIS — M25512 Pain in left shoulder: Secondary | ICD-10-CM | POA: Diagnosis present

## 2022-02-17 NOTE — Therapy (Signed)
?OUTPATIENT OCCUPATIONAL THERAPY TREATMENT NOTE ? ? ?Patient Name: Kristy Abbott ?MRN: [id number] ?DOB:1966/08/21, 56 y.o., female ?Today's Date: 02/17/2022 ? ?PCP: Monico Blitz, MD ?REFERRING PROVIDER: Monico Blitz, MD ? ?END OF SESSION:  ? OT End of Session - 02/17/22 0934   ? ? Visit Number 4   ? Number of Visits 21   ? Date for OT Re-Evaluation 04/16/22   ? Authorization Type [employer] - VL [id number]   ? Authorization - Visit Number 4   ? Authorization - Number of Visits 30   ? OT Start Time 0930   ? OT Stop Time 1015   ? OT Time Calculation (min) 45 min   ? Activity Tolerance Patient tolerated treatment well;Patient limited by pain   ? Behavior During Therapy [facility] for tasks assessed/performed   ? ?  ?  ? ?  ? ? ?Past Medical History:  ?Diagnosis Date  ? Anemia   ? Graves disease   ? Hypothyroidism   ? ?Past Surgical History:  ?Procedure Laterality Date  ? CESAREAN SECTION    ? COLONOSCOPY N/A 11/10/2018  ? Procedure: COLONOSCOPY;  Surgeon: Danie Binder, MD;  Location: [facility];  Service: Endoscopy;  Laterality: N/A;  1:30  ? Cyst removed from neck    ? IR ANGIO INTRA EXTRACRAN SEL COM CAROTID INNOMINATE BILAT MOD SED  01/28/2022  ? IR ANGIO VERTEBRAL SEL SUBCLAVIAN INNOMINATE UNI L MOD SED  01/28/2022  ? IR ANGIO VERTEBRAL SEL VERTEBRAL UNI R MOD SED  01/28/2022  ? IR US GUIDE VASC ACCESS RIGHT  01/28/2022  ? TOTAL ABDOMINAL HYSTERECTOMY  04/07/2019  ? Dr. Gari Crown  ? ?Patient Active Problem List  ? Diagnosis Date Noted  ? Stroke-like symptom   ? Left-sided weakness   ? History of CVA (cerebrovascular accident)   ? Neck pain 01/28/2022  ? Hyperlipidemia 01/28/2022  ? Headache 01/27/2022  ? Hypothyroidism 01/27/2022  ? GERD (gastroesophageal reflux disease) 01/27/2022  ? Dizziness 01/27/2022  ? CVA (cerebral vascular accident) ([place]) 01/27/2022  ? 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 Special screening for malignant neoplasms, colon   ? ? ?ONSET DATE: 01/26/22 ? ?REFERRING DIAG:  s/p vertebral artery occlusion and Lt  cerebellar stroke  ? ?THERAPY DIAG:  ?Acute pain of left shoulder ? ?Other lack of coordination ? ?Stiffness of left shoulder, not elsewhere classified ? ?Unsteadiness on feet ? ? ?PERTINENT HISTORY: Patient is a 56 y.o. 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 PMH: Hyperlipidemia, GERD (gastroesophageal reflux disease) ? ?PRECAUTIONS: No driving, no lifting >5 lbs ? ?SUBJECTIVE: I went back into the hospital but they found no evidence of new stroke. MD stated I could resume therapy (notes in Epic state to resume P.T.) ? ?PAIN:  ?Are you having pain? Yes: NPRS scale: 5/10 ?Pain location: neck and Lt shoulder ?Pain description: sharp pain, aching ?Aggravating factors: movement ?Relieving factors: relaxing, heat, tylenol ? ? ? ? ?OBJECTIVE: (Rt handed) ? ?TODAY'S TREATMENT: ?Reviewed shoulder HEP (adapted to use paper towel roll or shoe box as pt reports more comfortable). Reinforced proper shoulder positioning with ex's and w/ functional reaching.  ? ?Simulated tub transfer using tub transfer bench and how to cut shower liner to prevent water from getting out. Pt return demo. Pt provided w/ alternative option of shower chair w/ strategically placed grab bars however pt preferred bench option. Pt politely declined handout stating she could get  tub bench at [facility].  ? ?Practiced mid level reaching LUE to retrieve/replace cones w/ min initial cueing for positioning.  ? ?Seated: Copying small peg design Lt hand for fine motor coordination and visual/perceptual skills w/ little difficulty Lt hand, copying design w/ 100% accuracy ? ?UBE x 5 min, level 1 for normal reciprocal movement pattern and UB conditioning  ? ? ? ? ?  ? ? ? ? ?PATIENT EDUCATION: ?Education details: coordination HEP, review of supine shoulder exercises,  ?Person educated: Patient ?Education method: Explanation, demonstration, verbal cues ?Education  comprehension: verbalized understanding, returned demonstration ? ? ?HOME EXERCISE PROGRAM: ?Cane exercises supine, putty HEP 02/06/22 ?Coordination HEP 02/10/22 ? ? ? ?GOALS: ? ? ?SHORT TERM GOALS: Target date: 03/05/2022 ? ?Pt will be independent with HEP targeting Lt shoulder pain and ROM ? ?Baseline: ?Goal status: ONGOING ? ?2.  Pt to cut food mod I w/ A/E prn ?Baseline:  ?Goal status: MET in clinic using Lt hand as stabilizer w/ regular utensils ? ?3.  Pt to simulate tub transfers safely w/ DME prn and close sup ?Baseline:  ?Goal status: MET ? ?4.  Pt to improve functional use of LUE by performing 28 or greater on [employer] ?Baseline: 19 ?Goal status: ONGOING ? ?5.  Pt to improve grip strength Lt hand to 35 lbs or greater for opening jars/containers ?Baseline: 24.4 lbs (Rt = 66 lbs) ?Goal status: ONGOING ? ? ?LONG TERM GOALS: Target date: 04/16/2022 ? ?Independent with HEP for grip strength and coordination LT hand ?Baseline:  ?Goal status: ONGOING ? ?2.  Pt to return to cooking simple meal at distant supervision  ?Baseline: dependent  ?Goal status: INITIAL ? ?3.  Pt able to pick up and care for puppy using BUE's safely ?Baseline: unable ?Goal status: INITIAL ? ?4.  Pt to improve coordination as evidenced by performing 9 hole peg test in 28 sec or less ?Baseline: 34 sec (Rt = 18 sec) ?Goal status: INITIAL ? ?5.  Pt to improve LUE shoulder ROM by reaching into high shelf for light weight object w/ pain less than or equal to 3/10 ?Baseline: approx 90* w/ pain 6/10 ?Goal status: INITIAL ? ?6.  Pt to demo 90% shoulder movement for all functional tasks w/ pain 3/10 or under ?Baseline:  ?Goal status: INITIAL ? ?ASSESSMENT: ? ?CLINICAL IMPRESSION: ?Patient is progressing towards goals. Pt with increased understanding of proper positioning of LUE w/ reaching and DME options for tub/shower transfers ? ?PERFORMANCE DEFICITS in functional skills including ADLs, IADLs, coordination, sensation, ROM, strength, pain, FMC,  mobility, body mechanics, decreased knowledge of use of DME, and UE functional use ? ?IMPAIRMENTS are limiting patient from ADLs, IADLs, rest and sleep, work, leisure, and social participation.  ? ?COMORBIDITIES may have co-morbidities  that affects occupational performance. Patient will benefit from skilled OT to address above impairments and improve overall function. ? ?MODIFICATION OR ASSISTANCE TO COMPLETE EVALUATION: No modification of tasks or assist necessary to complete an evaluation. ? ?OT OCCUPATIONAL PROFILE AND HISTORY: Detailed assessment: Review of records and additional review of physical, cognitive, psychosocial history related to current functional performance. ? ?CLINICAL DECISION MAKING: LOW - limited treatment options, no task modification necessary ? ?REHAB POTENTIAL: Good ? ?EVALUATION COMPLEXITY: Low ? ? ? ?PLAN: ?OT FREQUENCY: 2x/week ? ?OT DURATION: 10 weeks, plus eval ? ?PLANNED INTERVENTIONS: self care/ADL training, therapeutic exercise, therapeutic activity, neuromuscular re-education, manual therapy, passive range of motion, functional mobility training, aquatic therapy, moist heat, cryotherapy, patient/family education, coping strategies training,  and DME and/or AE instructions ? ?RECOMMENDED OTHER SERVICES: NONE ? ?CONSULTED AND AGREED WITH PLAN OF CARE: Patient ? ?PLAN FOR NEXT SESSION: continue LUE NMR and functional reaching, monitor shoulder pain, continue coordination ? ?Redmond Baseman, OTR/L ?02/17/22 9:35 AM ?Phone [phone] ?FAX [phone] ? ?

## 2022-02-17 NOTE — Therapy (Signed)
?OUTPATIENT PHYSICAL THERAPY TREATMENT NOTE ? ? ?Patient Name: Kristy Abbott ?MRN: [id number] ?DOB:23-Jul-1966, 56 y.o., female ?Today's Date: 02/17/2022 ? ?PCP: Monico Blitz, MD ?REFERRING PROVIDER: Cherene Altes, MD   ? ?END OF SESSION:  ? PT End of Session - 02/17/22 0847   ? ? Visit Number 4   ? Number of Visits 17   Plus eval  ? Date for PT Re-Evaluation 04/30/22   ? Authorization Type [employer]   ? PT Start Time [phone]   ? PT Stop Time 0929   ? PT Time Calculation (min) 43 min   ? Equipment Utilized During Treatment --   ? Activity Tolerance Patient tolerated treatment well   ? Behavior During Therapy [facility] for tasks assessed/performed   ? ?  ?  ? ?  ? ? ? ? ?Past Medical History:  ?Diagnosis Date  ? Anemia   ? Graves disease   ? Hypothyroidism   ? ?Past Surgical History:  ?Procedure Laterality Date  ? CESAREAN SECTION    ? COLONOSCOPY N/A 11/10/2018  ? Procedure: COLONOSCOPY;  Surgeon: Danie Binder, MD;  [facility];  Service: Endoscopy;  Laterality: N/A;  1:30  ? Cyst removed from neck    ? IR ANGIO INTRA EXTRACRAN SEL COM CAROTID INNOMINATE BILAT MOD SED  01/28/2022  ? IR ANGIO VERTEBRAL SEL SUBCLAVIAN INNOMINATE UNI L MOD SED  01/28/2022  ? IR ANGIO VERTEBRAL SEL VERTEBRAL UNI R MOD SED  01/28/2022  ? IR US GUIDE VASC ACCESS RIGHT  01/28/2022  ? TOTAL ABDOMINAL HYSTERECTOMY  04/07/2019  ? Dr. Gari Crown  ? ?Patient Active Problem List  ? Diagnosis Date Noted  ? Stroke-like symptom   ? Left-sided weakness   ? History of CVA (cerebrovascular accident)   ? Neck pain 01/28/2022  ? Hyperlipidemia 01/28/2022  ? Headache 01/27/2022  ? Hypothyroidism 01/27/2022  ? GERD (gastroesophageal reflux disease) 01/27/2022  ? Dizziness 01/27/2022  ? CVA (cerebral vascular accident) ([place]) 01/27/2022  ? 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 Special screening for malignant neoplasms, colon   ? ? ?REFERRING DIAG: I77.74 (ICD-10-CM) - Vertebral artery dissection (HCC  ? ?THERAPY DIAG:  ?Unsteadiness on  feet ? ?Other abnormalities of gait and mobility ? ?Muscle weakness (generalized) ? ?PERTINENT HISTORY: history of anemia and Graves' disease who presented to the ER at [facility] with dizziness and was found on work-up to have a left vertebral artery occlusion with an acute cerebellar stroke. hypothyroidism, GERD, iron deficiency  ? ?PRECAUTIONS: Fall  ? ?SUBJECTIVE: Pt admitted to ER on 4/27 due to headache and increased dizziness, similar to what she experienced when she had her first stroke. Pt admitted for one night and discharged due to no changes on MRI/CTA. No other changes  ? ?PAIN:  ?Are you having pain? Yes ?NPRS scale: 5/10 ?Pain location: left shoulder and cervical spine ?Pain orientation: Left  ?PAIN TYPE: acute ?Pain description: intermittent and sharp  ?Aggravating factors: certain movements ?Relieving factors: Tylenol, heat   ? ?  VITALS: All BP readings taken in LUE in seated position ?   -Pre-session: 117/83 mmHg, HR 57 bpm ? ? ?TODAY'S TREATMENT: ?Self-care/home management  ?Lengthy discussion regarding new MRI/CTA readings and interpretation. Showed pt her imaging results and educated on V1-V4 segments of her L vertebral artery. Educated pt on typical side effects of cerebellar strokes and potential reasons for her dizziness. Pt reports frequent dizziness when looking/reaching to L side. Encouraged pt to turn her head slowly  to L side due to occlusion of L vertebral artery and pairing of cervical rotation/side-bending. Pt verbalized understanding  ?  ? Ther Ex ?Revised and demonstrated HEP (see bolded below) for improved single leg stability, glute/hip ER strength and L NMR. Used blue theraband for monster walks. Pt demonstrated each movement well and reported significant difficulty w/hip abduction/extension.  ?  ?PATIENT EDUCATION: ?Education details: See self-care section  ?Person educated: Patient ?Education method: Explanation ?Education comprehension: verbalized understanding and needs  further education ?  ?  ?HOME EXERCISE PROGRAM: ?Access Code: [email or web address] ?URL: [email or web address] ?Date: 02/17/2022 ?Prepared by: Mickie Bail Elisa Sorlie ? ?Exercises ?- Standing Near Stance in [place] with Eyes Closed  - 1 x daily - 5 x weekly - 1 sets - 3 reps - 30 seconds hold ?- Sit to stand with right foot elevated on small step/book   - 1 x daily - 7 x weekly - 3 sets - 10 reps ?- Side Stepping with Resistance at Thighs and Counter Support  - 1 x daily - 7 x weekly - 3 sets - 10 reps ?- Forward Backward Monster Walk with Band at [employer] and [employer]  - 1 x daily - 7 x weekly - 3 sets - 10 reps ?- Standing Hip Abduction with Counter Support  - 1 x daily - 7 x weekly - 3 sets - 10 reps ?- Standing Hip Extension with Counter Support  - 1 x daily - 7 x weekly - 3 sets - 10 reps ?  ?  ?  ?GOALS: ?Goals reviewed with patient? Yes ?  ?SHORT TERM GOALS: Target date: 03/05/2022 ?  ?Pt will be independent with independent HEP for improved strength, balance, transfers and gait. ?  ?Baseline: ?Goal status: INITIAL ?  ?2.  Pt will improve 5 x STS to less than or equal to 21 seconds without BUE support and minimal dizziness to demonstrate improved functional strength and transfer efficiency.   ?Baseline: 25.91s without BUE support, max dizziness  ?Goal status: INITIAL ?  ?3.  Pt will improve Berg score to 47/56 for decreased fall risk ?  ?Baseline: 42/56 ?Goal status: INITIAL ?  ?4.  Pt will improve gait velocity to at least 2.4 ft/s with LRAD for improved gait efficiency and functional mobility ?  ?Baseline: 2.06 ft/s without AD ?Goal status: INITIAL ?  ?  ?  ?LONG TERM GOALS: Target date: 04/02/2022 ?  ?Pt will be independent with final HEP for improved strength, balance, transfers and gait. ?  ?Baseline:  ?Goal status: INITIAL ?  ?2.  Pt will ambulate greater than or equal to 1,000 feet on 6MWT with LRAD mod I for improved cardiovascular endurance and BLE strength.  ? ?Baseline: 64' w/SBQC and CGA ?Goal status:  INITIAL ?  ?3.  Pt will improve Berg score to 51/56 for decreased fall risk ?  ?Baseline: 42/56 ?Goal status: INITIAL ?  ?4.  Pt will improve 5 x STS to less than or equal to 17 seconds to demonstrate improved functional strength and transfer efficiency.  ?  ?Baseline: 25.91s without BUE support  ?Goal status: INITIAL ?  ?5.  Pt will improve gait velocity to at least 2.8 ft/s with LRAD for improved gait efficiency and performance at community ambulator level  ?  ?Baseline: 2.04 ft/s without AD ?Goal status: INITIAL ?  ?  ?ASSESSMENT: ?  ?CLINICAL IMPRESSION: ?Emphasis of skilled PT session on discussing pt's most recent hospital admission and updating HEP due to initial exercises not being challenging enough.  Pt having more pain today, specifically in cervical region. Pt very challenged by hip abduction/extension and reported low back pain w/extension. Will add pelvic mobility to next HEP. Continue POC. ?  ?  ?OBJECTIVE IMPAIRMENTS Abnormal gait, decreased activity tolerance, decreased balance, decreased coordination, decreased endurance, decreased mobility, difficulty walking, decreased strength, impaired sensation, impaired UE functional use, and pain.  ?  ?ACTIVITY LIMITATIONS cleaning, community activity, driving, meal prep, occupation, and medication management.  ?  ?PERSONAL FACTORS Fitness and 1 comorbidity: dizziness  are also affecting patient's functional outcome.  ?  ?  ?REHAB POTENTIAL: Good ?  ?CLINICAL DECISION MAKING: Stable/uncomplicated ?  ?EVALUATION COMPLEXITY: Low ?  ?PLAN: ?PT FREQUENCY: 2x/week ?  ?PT DURATION: 12 weeks ?  ?PLANNED INTERVENTIONS: Therapeutic exercises, Therapeutic activity, Neuromuscular re-education, Balance training, Gait training, Patient/Family education, Stair training, and DME instructions ?  ?PLAN FOR NEXT SESSION: How is HEP? Monitor BP. Eccentric step downs (fwd & lateral), gait w/weight on LLE, heel/toes walks, tall > half kneel, treadmill training, pelvic tilts for  low back pain  ? ?Cruzita Lederer Ghina Bittinger, PT, DPT ?02/17/22, 9:37 AM  ? ? ? ? ? ? ? ? ? ? ? ? ? ? ? ? ? ? ? ?   ?

## 2022-02-19 ENCOUNTER — Encounter: Payer: Self-pay | Admitting: Physical Therapy

## 2022-02-19 ENCOUNTER — Ambulatory Visit: Payer: 59 | Admitting: Occupational Therapy

## 2022-02-19 ENCOUNTER — Ambulatory Visit: Payer: 59 | Admitting: Physical Therapy

## 2022-02-19 VITALS — BP 132/85

## 2022-02-19 VITALS — BP 129/95 | HR 59

## 2022-02-19 DIAGNOSIS — R2681 Unsteadiness on feet: Secondary | ICD-10-CM

## 2022-02-19 DIAGNOSIS — M25512 Pain in left shoulder: Secondary | ICD-10-CM | POA: Diagnosis not present

## 2022-02-19 DIAGNOSIS — R278 Other lack of coordination: Secondary | ICD-10-CM

## 2022-02-19 DIAGNOSIS — M6281 Muscle weakness (generalized): Secondary | ICD-10-CM

## 2022-02-19 DIAGNOSIS — R208 Other disturbances of skin sensation: Secondary | ICD-10-CM

## 2022-02-19 DIAGNOSIS — R2689 Other abnormalities of gait and mobility: Secondary | ICD-10-CM

## 2022-02-19 DIAGNOSIS — M25612 Stiffness of left shoulder, not elsewhere classified: Secondary | ICD-10-CM

## 2022-02-19 NOTE — Therapy (Signed)
?OUTPATIENT PHYSICAL THERAPY TREATMENT NOTE ? ? ?Patient Name: Kristy Abbott ?MRN: [id number] ?DOB:[birth year], 56 y.o., female ?Today's Date: 02/19/2022 ? ?PCP: [name], MD ?REFERRING PROVIDER: Lonia Blood, MD   ? ?END OF SESSION:  ? PT End of Session - 02/19/22 0847   ? ? Visit Number 5   ? Number of Visits 17   Plus eval  ? Date for PT Re-Evaluation 04/30/22   ? Authorization Type [employer]   ? PT Start Time [phone]   ? PT Stop Time [phone]   ? PT Time Calculation (min) 42 min   ? Activity Tolerance Patient tolerated treatment well   ? Behavior During Therapy [facility] for tasks assessed/performed   ? ?  ?  ? ?  ? ? ? ? ?Past Medical History:  ?Diagnosis Date  ? Anemia   ? Graves disease   ? Hypothyroidism   ? ?Past Surgical History:  ?Procedure Laterality Date  ? CESAREAN SECTION    ? COLONOSCOPY N/A 11/10/2018  ? Procedure: COLONOSCOPY;  Surgeon: West Bali, MD;  Location[facility];  Service: Endoscopy;  Laterality: N/A;  1:30  ? Cyst removed from neck    ? IR ANGIO INTRA EXTRACRAN SEL COM CAROTID INNOMINATE BILAT MOD SED  01/28/2022  ? IR ANGIO VERTEBRAL SEL SUBCLAVIAN INNOMINATE UNI L MOD SED  01/28/2022  ? IR ANGIO VERTEBRAL SEL VERTEBRAL UNI R MOD SED  01/28/2022  ? IR US GUIDE VASC ACCESS RIGHT  01/28/2022  ? TOTAL ABDOMINAL HYSTERECTOMY  04/07/2019  ? Dr. Ernestina Penna  ? ?Patient Active Problem List  ? Diagnosis Date Noted  ? Stroke-like symptom   ? Left-sided weakness   ? History of CVA (cerebrovascular accident)   ? Neck pain 01/28/2022  ? Hyperlipidemia 01/28/2022  ? Headache 01/27/2022  ? Hypothyroidism 01/27/2022  ? GERD (gastroesophageal reflux disease) 01/27/2022  ? Dizziness 01/27/2022  ? CVA (cerebral vascular accident) (HCC) 01/27/2022  ? 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 Special screening for malignant neoplasms, colon   ? ? ?REFERRING DIAG: I77.74 (ICD-10-CM) - Vertebral artery dissection (HCC  ? ?THERAPY DIAG:  ?Muscle weakness (generalized) ? ?Unsteadiness on feet ? ?Other  abnormalities of gait and mobility ? ?PERTINENT HISTORY: history of anemia and Graves' disease who presented to the ER at [facility] with dizziness and was found on work-up to have a left vertebral artery occlusion with an acute cerebellar stroke. hypothyroidism, GERD, iron deficiency  ? ?PRECAUTIONS: Fall  ? ?Vitals:  ? 02/19/22 0850  ?BP: (!) 129/95  ?Pulse: (!) 59  ?  ? ?SUBJECTIVE: Pt reports hurting her back with trying to get tied band on her legs to do her HEP. No falls. 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   ? ?PAIN:  ?Are you having pain? Yes ?NPRS scale: 7/10 ?Pain location: lower back and cervical spine ?Pain orientation: Left  ?PAIN TYPE: acute ?Pain description: intermittent and sharp  ?Aggravating factors: certain movements ?Relieving factors: Tylenol, heat   ? ?  ? ?TODAY'S TREATMENT: ?02/19/2022: ?STRENGTHENING  ?Hook lying on mat table- posterior pelvic tilts for 5 sec holds x 10 reps, verbal and tactile cues needed for correct technique initially. Double knee to chest stretch for 30 seconds x 3 reps. Lower trunk rotation stretch for 30 second holds x 3 reps each way. ?Seated at edge of mat- hamstring stretch for 30  sec's  3 reps each side ?Scifit LE's only level 4.5 x 8 minutes with goal >/= steps per minute for strengthening and activity tolerance. BP after 125/89, HR 67. ?  ? BALANCE/NMR ?Rockerboard: performed both ways on balance board with occasional touch to walls/chair, min guard to min assist for balance- rocking the board with emphasis on tall posture with EO, progressing to EC. Then holding the board steady for EC 30 seconds x 3 reps. Cues on posture and weight shifting to assist with balance.    ? ? BP at end of session 124/96, HR 69 ? ? ? ? ?PATIENT EDUCATION: ?Education details: continue with current HEP, provided band that pt can tie/untie so not to have to bend down ?Person educated:  Patient ?Education method: Explanation ?Education comprehension: verbalized understanding and needs further education ?  ?  ?HOME EXERCISE PROGRAM: ?Access Code: [email or web address] ?URL: [email or web address] ?Date: 02/19/2022 ?Prepared by: Sallyanne KusterKathy Rector Devonshire ? ?Exercises ?- Standing Near Stance in [place] with Eyes Closed  - 1 x daily - 5 x weekly - 1 sets - 3 reps - 30 seconds hold ?- Sit to stand with right foot elevated on small step/book   - 1 x daily - 7 x weekly - 3 sets - 10 reps ?- Side Stepping with Resistance at Thighs and Counter Support  - 1 x daily - 7 x weekly - 3 sets - 10 reps ?- Forward Backward Monster Walk with Band at [employer] and [employer] Support  - 1 x daily - 7 x weekly - 3 sets - 10 reps ?- Standing Hip Abduction with Counter Support  - 1 x daily - 7 x weekly - 3 sets - 10 reps ?- Standing Hip Extension with Counter Support  - 1 x daily - 7 x weekly - 3 sets - 10 reps ? ?Added to HEP this session: 02/19/22 ?- Supine Posterior Pelvic Tilt  - 1 x daily - 5 x weekly - 1 sets - 10 reps - 5 seconds hold ?- Supine Double Knee to Chest  - 1 x daily - 5 x weekly - 1 sets - 3 reps - 30 econds hold ?- Supine Lower Trunk Rotation  - 1 x daily - 5 x weekly - 1 sets - 3 reps - 30 seconds hold ?- Seated Hamstring Stretch  - 1 x daily - 5 x weekly - 1 sets - 3 reps - 30 seconds hold  ?  ?  ?GOALS: ?Goals reviewed with patient? Yes ?  ?SHORT TERM GOALS: Target date: 03/05/2022 ?  ?Pt will be independent with independent HEP for improved strength, balance, transfers and gait. ?  Baseline: ?Goal status: INITIAL ?  ?2.  Pt will improve 5 x STS to less than or equal to 21 seconds without BUE support and minimal dizziness to demonstrate improved functional strength and transfer efficiency.   ?Baseline: 25.91s without BUE support, max dizziness  ?Goal status: INITIAL ?  ?3.  Pt will improve Berg score to 47/56 for decreased fall risk ?  Baseline: 42/56 ?Goal status: INITIAL ?  ?4.  Pt will improve gait velocity to at least  2.4 ft/s with LRAD for improved gait efficiency and functional mobility ?  Baseline: 2.06 ft/s without AD ?Goal status: INITIAL ?  ?  ?  ?LONG TERM GOALS: Target date: 04/02/2022 ?  ?Pt will be independent with final HEP for improved strength, balance, transfers and gait. ?  Baseline:  ?Goal status: INITIAL ?  ?2.  Pt will ambulate greater than or  equal to 1,000 feet on with LRAD mod I for improved cardiovascular endurance and BLE strength.  ?Baseline: 52' w/SBQC and CGA ?Goal status: INITIAL ?  ?3.  Pt will improve Berg score to 51/56 for decreased fall risk ?  Baseline: 42/56 ?Goal status: INITIAL ?  ?4.  Pt will improve 5 x STS to less than or equal to 17 seconds to demonstrate improved functional strength and transfer efficiency.  ?  Baseline: 25.91s without BUE support  ?Goal status: INITIAL ?  ?5.  Pt will improve gait velocity to at least 2.8 ft/s with LRAD for improved gait efficiency and performance at community ambulator level  ?  Baseline: 2.04 ft/s without AD ?Goal status: INITIAL ?  ?  ?ASSESSMENT: ?  ?CLINICAL IMPRESSION: ?Today's skilled session initially focused on ex's/stretches to address back pain with pt reporting a mild improvement afterwards. Remainder of session continued to address strengthening and balance training with no issues noted or reported. The pt is making steady progress and should benefit from continued PT to progress toward unmet goals.  ?  ?  ?OBJECTIVE IMPAIRMENTS Abnormal gait, decreased activity tolerance, decreased balance, decreased coordination, decreased endurance, decreased mobility, difficulty walking, decreased strength, impaired sensation, impaired UE functional use, and pain.  ?  ?ACTIVITY LIMITATIONS cleaning, community activity, driving, meal prep, occupation, and medication management.  ?  ?PERSONAL FACTORS Fitness and 1 comorbidity: dizziness  are also affecting patient's functional outcome.  ?  ?  ?REHAB POTENTIAL: Good ?  ?CLINICAL DECISION MAKING:  Stable/uncomplicated ?  ?EVALUATION COMPLEXITY: Low ?  ?PLAN: ?PT FREQUENCY: 2x/week ?  ?PT DURATION: 12 weeks ?  ?PLANNED INTERVENTIONS: Therapeutic exercises, Therapeutic activity, Neuromuscular re-education, Balanc

## 2022-02-19 NOTE — Therapy (Signed)
?OUTPATIENT OCCUPATIONAL THERAPY TREATMENT NOTE ? ? ?Patient Name: Kristy Abbott ?MRN: [id number] ?DOB:[birth year], 56 y.o., female ?Today's Date: 02/19/2022 ? ?PCP: Monico Blitz, MD ?REFERRING PROVIDER: Monico Blitz, MD ? ?END OF SESSION:  ? OT End of Session - 02/19/22 0839   ? ? Visit Number 5   ? Number of Visits 21   ? Date for OT Re-Evaluation 04/16/22   ? Authorization Type [employer] - VL 60   ? Authorization - Visit Number 5   ? Authorization - Number of Visits 30   ? OT Start Time [phone]   ? OT Stop Time [phone]   ? OT Time Calculation (min) 40 min   ? Activity Tolerance Patient tolerated treatment well;Patient limited by pain   ? Behavior During Therapy [facility] for tasks assessed/performed   ? ?  ?  ? ?  ? ? ? ?Past Medical History:  ?Diagnosis Date  ? Anemia   ? Graves disease   ? Hypothyroidism   ? ?Past Surgical History:  ?Procedure Laterality Date  ? CESAREAN SECTION    ? COLONOSCOPY N/A 11/10/2018  ? Procedure: COLONOSCOPY;  Surgeon: Danie Binder, MD;  [facility];  Service: Endoscopy;  Laterality: N/A;  1:30  ? Cyst removed from neck    ? IR ANGIO INTRA EXTRACRAN SEL COM CAROTID INNOMINATE BILAT MOD SED  01/28/2022  ? IR ANGIO VERTEBRAL SEL SUBCLAVIAN INNOMINATE UNI L MOD SED  01/28/2022  ? IR ANGIO VERTEBRAL SEL VERTEBRAL UNI R MOD SED  01/28/2022  ? IR US GUIDE VASC ACCESS RIGHT  01/28/2022  ? TOTAL ABDOMINAL HYSTERECTOMY  04/07/2019  ? Dr. Gari Crown  ? ?Patient Active Problem List  ? Diagnosis Date Noted  ? Stroke-like symptom   ? Left-sided weakness   ? History of CVA (cerebrovascular accident)   ? Neck pain 01/28/2022  ? Hyperlipidemia 01/28/2022  ? Headache 01/27/2022  ? Hypothyroidism 01/27/2022  ? GERD (gastroesophageal reflux disease) 01/27/2022  ? Dizziness 01/27/2022  ? CVA (cerebral vascular accident) ([place]) 01/27/2022  ? 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 Special screening for malignant neoplasms, colon   ? ? ?ONSET DATE: 01/26/22 ? ?REFERRING DIAG:  s/p vertebral artery occlusion and Lt  cerebellar stroke  ? ?THERAPY DIAG:  ?Muscle weakness (generalized) ? ?Acute pain of left shoulder ? ?Other lack of coordination ? ?Stiffness of left shoulder, not elsewhere classified ? ?Other disturbances of skin sensation ? ?Unsteadiness on feet ? ?Other abnormalities of gait and mobility ? ? ?PERTINENT HISTORY: Patient is a 56 y.o. 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 PMH: Hyperlipidemia, GERD (gastroesophageal reflux disease) ? ?PRECAUTIONS: No driving, no lifting >5 lbs ? ?SUBJECTIVE: I went back into the hospital but they found no evidence of new stroke. MD stated I could resume therapy (notes in Epic state to resume P.T.) ? ?PAIN:  ?Are you having pain? Yes: NPRS scale: 5/10 ?Pain location: neck and head ?Pain description: sharp pain, aching ?Aggravating factors: movement ?Relieving factors: relaxing, heat, tylenol ? ? ? ? ?OBJECTIVE: (Rt handed) ? ?TODAY'S TREATMENT: ?Reviewed shoulder HEP- supine closed chain, min v.c ?Followed by closed chain shoulder flexion in seated to grossly 90*, no increased pain, 10 reps ?Pt demonstrates improved endurance today with exercises.  ? ?Standing to place graded clothespins 1-8# on vertical antennae, then seated and standing to remove clothespins to place on rack, pt demonstrates good performance, rest break before  removing. ? ?UBE x 5 min, level 1 for normal reciprocal movement pattern and UB conditioning  ? ?Placing grooved pegs into pegboard with LUE, removing with in hand manipulation, pt demonstrates improving coordination. ? ? ? ? ?  ? ? ? ? ?PATIENT EDUCATION: ?Education details: review of supine shoulder exercises,  ?Person educated: Patient ?Education method: Explanation, demonstration, verbal cues ?Education comprehension: verbalized understanding, returned demonstration ? ? ?HOME EXERCISE PROGRAM: ?Cane exercises supine, putty HEP 02/06/22 ?Coordination HEP  02/10/22 ? ? ? ?GOALS: ? ? ?SHORT TERM GOALS: Target date: 03/05/2022 ? ?Pt will be independent with HEP targeting Lt shoulder pain and ROM ? ?Baseline: ?Goal status: ONGOING ? ?2.  Pt to cut food mod I w/ A/E prn ?Baseline:  ?Goal status: MET in clinic using Lt hand as stabilizer w/ regular utensils ? ?3.  Pt to simulate tub transfers safely w/ DME prn and close sup ?Baseline:  ?Goal status: MET ? ?4.  Pt to improve functional use of LUE by performing 28 or greater on [employer] ?Baseline: 19 ?Goal status: ONGOING ? ?5.  Pt to improve grip strength Lt hand to 35 lbs or greater for opening jars/containers ?Baseline: 24.4 lbs (Rt = 66 lbs) ?Goal status: ONGOING ? ? ?LONG TERM GOALS: Target date: 04/16/2022 ? ?Independent with HEP for grip strength and coordination LT hand ?Baseline:  ?Goal status: ONGOING ? ?2.  Pt to return to cooking simple meal at distant supervision  ?Baseline: dependent  ?Goal status: INITIAL ? ?3.  Pt able to pick up and care for puppy using BUE's safely ?Baseline: unable ?Goal status: INITIAL ? ?4.  Pt to improve coordination as evidenced by performing 9 hole peg test in 28 sec or less ?Baseline: 34 sec (Rt = 18 sec) ?Goal status: INITIAL ? ?5.  Pt to improve LUE shoulder ROM by reaching into high shelf for light weight object w/ pain less than or equal to 3/10 ?Baseline: approx 90* w/ pain 6/10 ?Goal status: INITIAL ? ?6.  Pt to demo 90% shoulder movement for all functional tasks w/ pain 3/10 or under ?Baseline:  ?Goal status: INITIAL ? ?ASSESSMENT: ? ?CLINICAL IMPRESSION: ?Patient is progressing towards goals. Pt with increased overall endurance. ? ?PERFORMANCE DEFICITS in functional skills including ADLs, IADLs, coordination, sensation, ROM, strength, pain, FMC, mobility, body mechanics, decreased knowledge of use of DME, and UE functional use ? ?IMPAIRMENTS are limiting patient from ADLs, IADLs, rest and sleep, work, leisure, and social participation.  ? ?COMORBIDITIES may have  co-morbidities  that affects occupational performance. Patient will benefit from skilled OT to address above impairments and improve overall function. ? ?MODIFICATION OR ASSISTANCE TO COMPLETE EVALUATION: No modification of tasks or assist necessary to complete an evaluation. ? ?OT OCCUPATIONAL PROFILE AND HISTORY: Detailed assessment: Review of records and additional review of physical, cognitive, psychosocial history related to current functional performance. ? ?CLINICAL DECISION MAKING: LOW - limited treatment options, no task modification necessary ? ?REHAB POTENTIAL: Good ? ?EVALUATION COMPLEXITY: Low ? ? ? ?PLAN: ?OT FREQUENCY: 2x/week ? ?OT DURATION: 10 weeks, plus eval ? ?PLANNED INTERVENTIONS: self care/ADL training, therapeutic exercise, therapeutic activity, neuromuscular re-education, manual therapy, passive range of motion, functional mobility training, aquatic therapy, moist heat, cryotherapy, patient/family education, coping strategies training, and DME and/or AE instructions ? ?RECOMMENDED OTHER SERVICES: NONE ? ?CONSULTED AND AGREED WITH PLAN OF CARE: Patient ? ?PLAN FOR NEXT SESSION: continue coordination,  LUE NMR and functional reaching, monitor shoulder pain,  ? ?Curt Bears Abisola Carrero, OTR/L ?[phone] ?  Phone: [phone] ?8:40 AM 02/19/22  ? ?

## 2022-02-24 ENCOUNTER — Ambulatory Visit: Payer: 59 | Admitting: Physical Therapy

## 2022-02-24 ENCOUNTER — Ambulatory Visit: Payer: 59 | Admitting: Occupational Therapy

## 2022-02-24 ENCOUNTER — Encounter: Payer: Self-pay | Admitting: Physical Therapy

## 2022-02-24 VITALS — BP 126/80

## 2022-02-24 DIAGNOSIS — R278 Other lack of coordination: Secondary | ICD-10-CM

## 2022-02-24 DIAGNOSIS — M6281 Muscle weakness (generalized): Secondary | ICD-10-CM

## 2022-02-24 DIAGNOSIS — M25512 Pain in left shoulder: Secondary | ICD-10-CM | POA: Diagnosis not present

## 2022-02-24 DIAGNOSIS — R2689 Other abnormalities of gait and mobility: Secondary | ICD-10-CM

## 2022-02-24 DIAGNOSIS — R2681 Unsteadiness on feet: Secondary | ICD-10-CM

## 2022-02-24 DIAGNOSIS — M25612 Stiffness of left shoulder, not elsewhere classified: Secondary | ICD-10-CM

## 2022-02-24 NOTE — Therapy (Signed)
?OUTPATIENT PHYSICAL THERAPY TREATMENT NOTE ? ? ?Patient Name: Kristy Abbott ?MRN: [id number] ?DOB:[birth year], 56 y.o., female ?Today's Date: 02/24/2022 ? ?PCP: [name], MD ?REFERRING PROVIDER: Lonia Blood, MD   ? ?END OF SESSION:  ? PT End of Session - 02/24/22 0940   ? ? Visit Number 6   ? Number of Visits 17   Plus eval  ? Date for PT Re-Evaluation 04/30/22   ? Authorization Type [employer]   ? PT Start Time 0932   ? PT Stop Time 1012   ? PT Time Calculation (min) 40 min   ? Equipment Utilized During Treatment Gait belt   ? Activity Tolerance Patient tolerated treatment well   ? Behavior During Therapy [facility] for tasks assessed/performed   ? ?  ?  ? ?  ? ? ? ? ?Past Medical History:  ?Diagnosis Date  ? Anemia   ? Graves disease   ? Hypothyroidism   ? ?Past Surgical History:  ?Procedure Laterality Date  ? CESAREAN SECTION    ? COLONOSCOPY N/A 11/10/2018  ? Procedure: COLONOSCOPY;  Surgeon: West Bali, MD;  [facility];  Service: Endoscopy;  Laterality: N/A;  1:30  ? Cyst removed from neck    ? IR ANGIO INTRA EXTRACRAN SEL COM CAROTID INNOMINATE BILAT MOD SED  01/28/2022  ? IR ANGIO VERTEBRAL SEL SUBCLAVIAN INNOMINATE UNI L MOD SED  01/28/2022  ? IR ANGIO VERTEBRAL SEL VERTEBRAL UNI R MOD SED  01/28/2022  ? IR US GUIDE VASC ACCESS RIGHT  01/28/2022  ? TOTAL ABDOMINAL HYSTERECTOMY  04/07/2019  ? Dr. Ernestina Penna  ? ?Patient Active Problem List  ? Diagnosis Date Noted  ? Stroke-like symptom   ? Left-sided weakness   ? History of CVA (cerebrovascular accident)   ? Neck pain 01/28/2022  ? Hyperlipidemia 01/28/2022  ? Headache 01/27/2022  ? Hypothyroidism 01/27/2022  ? GERD (gastroesophageal reflux disease) 01/27/2022  ? Dizziness 01/27/2022  ? CVA (cerebral vascular accident) (HCC) 01/27/2022  ? 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 Special screening for malignant neoplasms, colon   ? ? ?REFERRING DIAG: I77.74 (ICD-10-CM) - Vertebral artery dissection (HCC  ? ?THERAPY DIAG:  ?Muscle  weakness (generalized) ? ?Unsteadiness on feet ? ?Other abnormalities of gait and mobility ? ?PERTINENT HISTORY: history of anemia and Graves' disease who presented to the ER at [facility] with dizziness and was found on work-up to have a left vertebral artery occlusion with an acute cerebellar stroke. hypothyroidism, GERD, iron deficiency  ? ?PRECAUTIONS: Fall  ? ?Vitals:  ? 02/24/22 0936  ?BP: 126/80  ?  ? ?SUBJECTIVE: Pt states she has been using heat and frequent position changes at home to help with neck and back discomfort.  HEP is going well and still feels challenging.   ? ?PAIN:  ?Are you having pain? Yes ?NPRS scale: 3/10 ?Pain location: lower back and cervical spine ?Pain orientation: Left  ?PAIN TYPE: acute ?Pain description: intermittent and sharp  ?Aggravating factors: certain movements ?Relieving factors: Tylenol, heat   ? ?  ? ?TODAY'S TREATMENT: ?6" step ups w/ RUE support progressed to no UE support 2x15 each LE ?Blue mat alt tandem ECx45sec, inc difficulty w/ LLE in back> Tandem EO head turns x20 ?Standing on airex at [facility] pt performs alt cone taps x15 each LE>continuous taps to 2 cones crossing midline x15 ?STS on airex x12 ?Treadmill training at 1.15mph x6 mins w/ BUE support progressed to no UE support w/ mod cues for inc  step length on RLE using heel clearance past opposite toe as visual cue and inc heel strike on LLE.  Progressed to overground gait w/o tripod cane w/ min cues for improved stride length for energy conservation with gait.  Pt demos good carryover from treadmill w/ 1 LOB laterally w/ lateral step to recover. ? ? ? ? ? ? ?PATIENT EDUCATION: ?Education details: Energy conservation w/ improved step size during gait regardless of use of AD. ?Person educated: Patient ?Education method: Explanation ?Education comprehension: verbalized understanding and needs further education ?  ?  ?HOME EXERCISE PROGRAM: ?Access Code: [email or web address] ?URL: [email or web address] ?Date:  02/19/2022 ?Prepared by: Sallyanne KusterKathy Bury ? ?Exercises ?- Standing Near Stance in [place] with Eyes Closed  - 1 x daily - 5 x weekly - 1 sets - 3 reps - 30 seconds hold ?- Sit to stand with right foot elevated on small step/book   - 1 x daily - 7 x weekly - 3 sets - 10 reps ?- Side Stepping with Resistance at Thighs and Counter Support  - 1 x daily - 7 x weekly - 3 sets - 10 reps ?- Forward Backward Monster Walk with Band at Emerson [employer]ighs and [employer] Col[employer]Counter Support  - 1 x daily - 7 x weekly - 3 sets - 10 reps ?- Standing Hip Abduction with Counter Support  - 1 x daily - 7 x weekly - 3 sets - 10 reps ?- Standing Hip Extension with Counter Support  - 1 x daily - 7 x weekly - 3 sets - 10 reps ? ?Added to HEP this session: 02/19/22 ?- Supine Posterior Pelvic Tilt  - 1 x daily - 5 x weekly - 1 sets - 10 reps - 5 seconds hold ?- Supine Double Knee to Chest  - 1 x daily - 5 x weekly - 1 sets - 3 reps - 30 econds hold ?- Supine Lower Trunk Rotation  - 1 x daily - 5 x weekly - 1 sets - 3 reps - 30 seconds hold ?- Seated Hamstring Stretch  - 1 x daily - 5 x weekly - 1 sets - 3 reps - 30 seconds hold  ?  ?  ?GOALS: ?Goals reviewed with patient? Yes ?  ?SHORT TERM GOALS: Target date: 03/05/2022 ?  ?Pt will be independent with independent HEP for improved strength, balance, transfers and gait. ?  Baseline: ?Goal status: INITIAL ?  ?2.  Pt will improve 5 x STS to less than or equal to 21 seconds without BUE support and minimal dizziness to demonstrate improved functional strength and transfer efficiency.   ?Baseline: 25.91s without BUE support, max dizziness  ?Goal status: INITIAL ?  ?3.  Pt will improve Berg score to 47/56 for decreased fall risk ?  Baseline: 42/56 ?Goal status: INITIAL ?  ?4.  Pt will improve gait velocity to at least 2.4 ft/s with LRAD for improved gait efficiency and functional mobility ?  Baseline: 2.06 ft/s without AD ?Goal status: INITIAL ?  ?  ?  ?LONG TERM GOALS: Target date: 04/02/2022 ?  ?Pt will be independent with final HEP  for improved strength, balance, transfers and gait. ?  Baseline:  ?Goal status: INITIAL ?  ?2.  Pt will ambulate greater than or equal to 1,000 feet on 6MWT with LRAD mod I for improved cardiovascular endurance and BLE strength.  ?Baseline: 31745' w/SBQC and CGA ?Goal status: INITIAL ?  ?3.  Pt will improve Berg score to 51/56 for decreased fall risk ?  Baseline: 42/56 ?Goal  status: INITIAL ?  ?4.  Pt will improve 5 x STS to less than or equal to 17 seconds to demonstrate improved functional strength and transfer efficiency.  ?  Baseline: 25.91s without BUE support  ?Goal status: INITIAL ?  ?5.  Pt will improve gait velocity to at least 2.8 ft/s with LRAD for improved gait efficiency and performance at community ambulator level  ?  Baseline: 2.04 ft/s without AD ?Goal status: INITIAL ?  ?  ?ASSESSMENT: ?  ?CLINICAL IMPRESSION: ?Pt's back pain improved from prior session with pt compliant with updated HEP.  Today's skilled session aimed at addressing gait mechanics and high level balance activities to appropriately challenge current functional status.  Will continue to address deficits per POC. ?  ?  ?OBJECTIVE IMPAIRMENTS Abnormal gait, decreased activity tolerance, decreased balance, decreased coordination, decreased endurance, decreased mobility, difficulty walking, decreased strength, impaired sensation, impaired UE functional use, and pain.  ?  ?ACTIVITY LIMITATIONS cleaning, community activity, driving, meal prep, occupation, and medication management.  ?  ?PERSONAL FACTORS Fitness and 1 comorbidity: dizziness  are also affecting patient's functional outcome.  ?  ?  ?REHAB POTENTIAL: Good ?  ?CLINICAL DECISION MAKING: Stable/uncomplicated ?  ?EVALUATION COMPLEXITY: Low ?  ?PLAN: ?PT FREQUENCY: 2x/week ?  ?PT DURATION: 12 weeks ?  ?PLANNED INTERVENTIONS: Therapeutic exercises, Therapeutic activity, Neuromuscular re-education, Balance training, Gait training, Patient/Family education, Stair training, and DME  instructions ?  ?PLAN FOR NEXT SESSION:  Monitor BP.  Dual task, head turns/nods during dynamic activity. treadmill training (progress speed >1.0 mph and incline) vs Scifit for activity tolerance. Continue to address gait

## 2022-02-24 NOTE — Therapy (Signed)
?OUTPATIENT OCCUPATIONAL THERAPY TREATMENT NOTE ? ? ?Patient Name: Kristy Abbott ?MRN: [id number] ?DOB:June 29, 1966, 56 y.o., female ?Today's Date: 02/24/2022 ? ?PCP: Monico Blitz, MD ?REFERRING PROVIDER: Cherene Altes, MD ? ?END OF SESSION:  ? OT End of Session - 02/24/22 1015   ? ? Visit Number 6   ? Number of Visits 21   ? Date for OT Re-Evaluation 04/16/22   ? Authorization Type [employer] - VL 60   ? Authorization - Visit Number 6   ? Authorization - Number of Visits 30   ? OT Start Time 1015   ? OT Stop Time 1100   ? OT Time Calculation (min) 45 min   ? Activity Tolerance Patient tolerated treatment well;Patient limited by pain   ? Behavior During Therapy [facility] for tasks assessed/performed   ? ?  ?  ? ?  ? ? ? ?Past Medical History:  ?Diagnosis Date  ? Anemia   ? Graves disease   ? Hypothyroidism   ? ?Past Surgical History:  ?Procedure Laterality Date  ? CESAREAN SECTION    ? COLONOSCOPY N/A 11/10/2018  ? Procedure: COLONOSCOPY;  Surgeon: Danie Binder, MD;  [facility];  Service: Endoscopy;  Laterality: N/A;  1:30  ? Cyst removed from neck    ? IR ANGIO INTRA EXTRACRAN SEL COM CAROTID INNOMINATE BILAT MOD SED  01/28/2022  ? IR ANGIO VERTEBRAL SEL SUBCLAVIAN INNOMINATE UNI L MOD SED  01/28/2022  ? IR ANGIO VERTEBRAL SEL VERTEBRAL UNI R MOD SED  01/28/2022  ? IR US GUIDE VASC ACCESS RIGHT  01/28/2022  ? TOTAL ABDOMINAL HYSTERECTOMY  04/07/2019  ? Dr. Gari Crown  ? ?Patient Active Problem List  ? Diagnosis Date Noted  ? Stroke-like symptom   ? Left-sided weakness   ? History of CVA (cerebrovascular accident)   ? Neck pain 01/28/2022  ? Hyperlipidemia 01/28/2022  ? Headache 01/27/2022  ? Hypothyroidism 01/27/2022  ? GERD (gastroesophageal reflux disease) 01/27/2022  ? Dizziness 01/27/2022  ? CVA (cerebral vascular accident) ([place]) 01/27/2022  ? 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 Special screening for malignant neoplasms, colon   ? ? ?ONSET DATE: 01/26/22 ? ?REFERRING DIAG:  s/p vertebral artery occlusion  and Lt cerebellar stroke  ? ?THERAPY DIAG:  ?Acute pain of left shoulder ? ?Muscle weakness (generalized) ? ?Other lack of coordination ? ?Stiffness of left shoulder, not elsewhere classified ? ? ?PERTINENT HISTORY: Patient is a 56 y.o. 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 PMH: Hyperlipidemia, GERD (gastroesophageal reflux disease) ? ?PRECAUTIONS: No driving, no lifting >5 lbs ? ?SUBJECTIVE: I'm doing better. Shoulder is better; I got a new hot pack and that seems to help as well ? ?PAIN:  ?Are you having pain? Yes: NPRS scale: 3/10 ?Pain location: neck and shoulder ?Pain description: dull, aching ?Aggravating factors: movement ?Relieving factors: relaxing, heat, tylenol ? ? ? ? ?OBJECTIVE: (Rt handed) ? ?TODAY'S TREATMENT: ? ?Pt performing mid level reaching to approx 90* to place small pegs in pegboard on vertical surface (copying design at 100% accuracy), then removing mainpulating 5 at a time for in hand coordination.  ? ?Assessed progress to date and STG's - see goal section for objective measurements ? ?Pt issued HEP for posterior sh girdle strengthening in prone- see pt instructions for details. Pt performed each x 10 w/ no increase in sh pain ? ?PATIENT EDUCATION: ?Education details: Prone posterior sh girdle  strengthening HEP  ?Person educated: Patient ?Education method: Explanation, Demonstration, and Handouts ?Education comprehension: verbalized understanding, returned demonstration, and verbal cues required ? ? ? ? ? ? ?HOME EXERCISE PROGRAM: ?Cane exercises supine, putty HEP 02/06/22 ?Coordination HEP 02/10/22 ?Prone sh HEP 02/24/22 ? ? ?GOALS: ? ? ?SHORT TERM GOALS: Target date: 03/05/2022 ? ?Pt will be independent with HEP targeting Lt shoulder pain and ROM ? ?Baseline: ?Goal status: MET ? ?2.  Pt to cut food mod I w/ A/E prn ?Baseline:  ?Goal status: MET in clinic using Lt hand as stabilizer w/ regular  utensils ? ?3.  Pt to simulate tub transfers safely w/ DME prn and close sup ?Baseline:  ?Goal status: MET ? ?4.  Pt to improve functional use of LUE by performing 28 or greater on Box [employer] Blocks ?Baseline: 19 ?Goal status: MET (02/24/22: 41) ? ?5.  Pt to improve grip strength Lt hand to 35 lbs or greater for opening jars/containers ?Baseline: 24.4 lbs (Rt = 66 lbs) ?Goal status: MET (02/24/22: 43.2 LBS)  ? ? ?LONG TERM GOALS: Target date: 04/16/2022 ? ?Independent with HEP for grip strength and coordination LT hand ?Baseline:  ?Goal status: MET ? ?2.  Pt to return to cooking simple meal at distant supervision  ?Baseline: dependent  ?Goal status: ONGOING w/ direct supervision ? ?3.  Pt able to pick up and care for puppy using BUE's safely ?Baseline: unable ?Goal status: ONGOING ? ?4.  Pt to improve coordination as evidenced by performing 9 hole peg test in 28 sec or less ?Baseline: 34 sec (Rt = 18 sec) ?Goal status: MET (02/24/22: 25.47 sec)  ? ?5.  Pt to improve LUE shoulder ROM by reaching into high shelf for light weight object w/ pain less than or equal to 3/10 ?Baseline: approx 90* w/ pain 6/10 ?Goal status: ONGOING ? ?6.  Pt to demo 90% shoulder movement for all functional tasks w/ pain 3/10 or under ?Baseline:  ?Goal status: ONGOING ? ?ASSESSMENT: ? ?CLINICAL IMPRESSION: ?Pt has met all STG's and 2 LTG's. Pt progressing w/ coordination, Lt sh ROM and pain.  ? ?PERFORMANCE DEFICITS in functional skills including ADLs, IADLs, coordination, sensation, ROM, strength, pain, FMC, mobility, body mechanics, decreased knowledge of use of DME, and UE functional use ? ?IMPAIRMENTS are limiting patient from ADLs, IADLs, rest and sleep, work, leisure, and social participation.  ? ?COMORBIDITIES may have co-morbidities  that affects occupational performance. Patient will benefit from skilled OT to address above impairments and improve overall function. ? ?MODIFICATION OR ASSISTANCE TO COMPLETE EVALUATION: No modification of tasks  or assist necessary to complete an evaluation. ? ?OT OCCUPATIONAL PROFILE AND HISTORY: Detailed assessment: Review of records and additional review of physical, cognitive, psychosocial history related to current functional performance. ? ?CLINICAL DECISION MAKING: LOW - limited treatment options, no task modification necessary ? ?REHAB POTENTIAL: Good ? ?EVALUATION COMPLEXITY: Low ? ? ? ?PLAN: ?OT FREQUENCY: 2x/week ? ?OT DURATION: 10 weeks, plus eval ? ?PLANNED INTERVENTIONS: self care/ADL training, therapeutic exercise, therapeutic activity, neuromuscular re-education, manual therapy, passive range of motion, functional mobility training, aquatic therapy, moist heat, cryotherapy, patient/family education, coping strategies training, and DME and/or AE instructions ? ?RECOMMENDED OTHER SERVICES: NONE ? ?CONSULTED AND AGREED WITH PLAN OF CARE: Patient ? ?PLAN FOR NEXT SESSION: review prone HEP, continue coordination, LUE reaching and NMR ? ?Redmond Baseman, OTR/L ?02/24/22 10:16 AM ?Phone [phone] ?FAX [phone] ? ?

## 2022-02-24 NOTE — Patient Instructions (Signed)
Scapular Retraction (Prone) ? ? ? ?Lie with arms at sides. Pinch shoulder blades together and raise shoulders towards ceiling, NOT ears. (Place shoulder blades in back pockets) ?Repeat __10__ times per set. Do __2__ sessions per day. ? ?On Elbows (Prone) ? ? ? ?Rise up on elbows as high as possible, keeping hips and stomach on floor. Hold __5__ seconds. ?Repeat _10___ times per set.  Do __2__ sessions per day. ? ?Extension - Prone ([place]) ? ? ? ?Lie withlleft arm hanging off side of bed. Lift hand back and up. ?Repeat _10___ times per set. Do __2__ sets per day. NO weight ? ? ? ? ?

## 2022-02-26 ENCOUNTER — Ambulatory Visit: Payer: 59 | Admitting: Occupational Therapy

## 2022-02-26 ENCOUNTER — Encounter: Payer: Self-pay | Admitting: Occupational Therapy

## 2022-02-26 ENCOUNTER — Ambulatory Visit: Payer: 59 | Admitting: Physical Therapy

## 2022-02-26 DIAGNOSIS — M6281 Muscle weakness (generalized): Secondary | ICD-10-CM

## 2022-02-26 DIAGNOSIS — M25612 Stiffness of left shoulder, not elsewhere classified: Secondary | ICD-10-CM

## 2022-02-26 DIAGNOSIS — M25512 Pain in left shoulder: Secondary | ICD-10-CM

## 2022-02-26 DIAGNOSIS — R278 Other lack of coordination: Secondary | ICD-10-CM

## 2022-02-26 DIAGNOSIS — R2681 Unsteadiness on feet: Secondary | ICD-10-CM

## 2022-02-26 DIAGNOSIS — R2689 Other abnormalities of gait and mobility: Secondary | ICD-10-CM

## 2022-02-26 NOTE — Therapy (Signed)
?OUTPATIENT OCCUPATIONAL THERAPY TREATMENT NOTE ? ? ?Patient Name: Kristy Abbott ?MRN: [id number] ?DOB:1966/06/03, 56 y.o., female ?Today's Date: 02/26/2022 ? ?PCP: Monico Blitz, MD ?REFERRING PROVIDER: Monico Blitz, MD ? ?END OF SESSION:  ? OT End of Session - 02/26/22 0845   ? ? Visit Number 7   ? Number of Visits 21   ? Date for OT Re-Evaluation 04/16/22   ? Authorization Type [employer] - VL 60   ? Authorization - Visit Number 7   ? Authorization - Number of Visits 30   ? OT Start Time 0845   ? OT Stop Time 0930   ? OT Time Calculation (min) 45 min   ? Activity Tolerance Patient tolerated treatment well;Patient limited by pain   ? Behavior During Therapy [facility] for tasks assessed/performed   ? ?  ?  ? ?  ? ? ? ?Past Medical History:  ?Diagnosis Date  ? Anemia   ? Graves disease   ? Hypothyroidism   ? ?Past Surgical History:  ?Procedure Laterality Date  ? CESAREAN SECTION    ? COLONOSCOPY N/A 11/10/2018  ? Procedure: COLONOSCOPY;  Surgeon: Danie Binder, MD;  [facility];  Service: Endoscopy;  Laterality: N/A;  1:30  ? Cyst removed from neck    ? IR ANGIO INTRA EXTRACRAN SEL COM CAROTID INNOMINATE BILAT MOD SED  01/28/2022  ? IR ANGIO VERTEBRAL SEL SUBCLAVIAN INNOMINATE UNI L MOD SED  01/28/2022  ? IR ANGIO VERTEBRAL SEL VERTEBRAL UNI R MOD SED  01/28/2022  ? IR US GUIDE VASC ACCESS RIGHT  01/28/2022  ? TOTAL ABDOMINAL HYSTERECTOMY  04/07/2019  ? Dr. Gari Crown  ? ?Patient Active Problem List  ? Diagnosis Date Noted  ? Stroke-like symptom   ? Left-sided weakness   ? History of CVA (cerebrovascular accident)   ? Neck pain 01/28/2022  ? Hyperlipidemia 01/28/2022  ? Headache 01/27/2022  ? Hypothyroidism 01/27/2022  ? GERD (gastroesophageal reflux disease) 01/27/2022  ? Dizziness 01/27/2022  ? CVA (cerebral vascular accident) ([place]) 01/27/2022  ? 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 Special screening for malignant neoplasms, colon   ? ? ?ONSET DATE: 01/26/22 ? ?REFERRING DIAG:  s/p vertebral artery occlusion and Lt  cerebellar stroke  ? ?THERAPY DIAG:  ?Acute pain of left shoulder ? ?Muscle weakness (generalized) ? ?Other lack of coordination ? ?Stiffness of left shoulder, not elsewhere classified ? ? ?PERTINENT HISTORY: Patient is a 56 y.o. 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 PMH: Hyperlipidemia, GERD (gastroesophageal reflux disease) ? ?PRECAUTIONS: No driving, no lifting >5 lbs ? ?SUBJECTIVE: I just have some mild pain along Lt side of neck and lower back but my shoulder is much better ? ?PAIN:  ?Are you having pain? Yes: NPRS scale: 3/10 ?Pain location: neck and lower back ?Pain description: dull, sore, aching ?Aggravating factors: movement ?Relieving factors: relaxing, heat, tylenol ? ? ? ? ?OBJECTIVE: (Rt handed) ? ?TODAY'S TREATMENT: ? ?Reviewed prone HEP for sh girdle. Pt demo each x 10 reps for neuro re-education w/ occasional tactile cues.  ?BUE AA/ROM in high level flex/ext (ball along wall w/ forearms on ball), followed by LUE AA/ROM in high level flexion w/ UE Ranger on vertical surface.  ? ?Pt placing grooved pegs in pegboard Lt hand, then removing with tweezers w/ little to no difficulty. Pt standing w/ one hand countertop support to place items on floor and then pick up  off floor (both sides) ? ?Kinesiotaped Lt upper trap to relax muscle (due to soreness/mild pain) and instructed pt in wear and care.  ? ?UBE x 8 min, level 2 for normal reciprocal movement pattern and UB conditioning  ? ? ?PATIENT EDUCATION: ?02/24/22: Education details: Prone posterior sh girdle strengthening HEP  ?Person educated: Patient ?Education method: Explanation, Demonstration, and Handouts ?Education comprehension: verbalized understanding, returned demonstration, and verbal cues required ? ? ? ? ? ? ?HOME EXERCISE PROGRAM: ?Cane exercises supine, putty HEP 02/06/22 ?Coordination HEP 02/10/22 ?Prone sh HEP 02/24/22 ? ? ?GOALS: ? ? ?SHORT  TERM GOALS: Target date: 03/05/2022 ? ?Pt will be independent with HEP targeting Lt shoulder pain and ROM ? ?Baseline: ?Goal status: MET ? ?2.  Pt to cut food mod I w/ A/E prn ?Baseline:  ?Goal status: MET in clinic using Lt hand as stabilizer w/ regular utensils ? ?3.  Pt to simulate tub transfers safely w/ DME prn and close sup ?Baseline:  ?Goal status: MET ? ?4.  Pt to improve functional use of LUE by performing 28 or greater on Box [employer] Blocks ?Baseline: 19 ?Goal status: MET (02/24/22: 41) ? ?5.  Pt to improve grip strength Lt hand to 35 lbs or greater for opening jars/containers ?Baseline: 24.4 lbs (Rt = 66 lbs) ?Goal status: MET (02/24/22: 43.2 LBS)  ? ? ?LONG TERM GOALS: Target date: 04/16/2022 ? ?Independent with HEP for grip strength and coordination LT hand ?Baseline:  ?Goal status: MET ? ?2.  Pt to return to cooking simple meal at distant supervision  ?Baseline: dependent  ?Goal status: ONGOING w/ direct supervision ? ?3.  Pt able to pick up and care for puppy using BUE's safely ?Baseline: unable ?Goal status: ONGOING ? ?4.  Pt to improve coordination as evidenced by performing 9 hole peg test in 28 sec or less ?Baseline: 34 sec (Rt = 18 sec) ?Goal status: MET (02/24/22: 25.47 sec)  ? ?5.  Pt to improve LUE shoulder ROM by reaching into high shelf for light weight object w/ pain less than or equal to 3/10 ?Baseline: approx 90* w/ pain 6/10 ?Goal status: ONGOING ? ?6.  Pt to demo 90% shoulder movement for all functional tasks w/ pain 3/10 or under ?Baseline:  ?Goal status: ONGOING ? ?ASSESSMENT: ? ?CLINICAL IMPRESSION: ?Pt has met all STG's and 2 LTG's. Pt progressing w/ coordination, Lt sh ROM and pain. Pt's pain in Lt shoulder has improved significantly but still has pain at neck along upper traps on Lt side. ? ?PERFORMANCE DEFICITS in functional skills including ADLs, IADLs, coordination, sensation, ROM, strength, pain, FMC, mobility, body mechanics, decreased knowledge of use of DME, and UE functional  use ? ?IMPAIRMENTS are limiting patient from ADLs, IADLs, rest and sleep, work, leisure, and social participation.  ? ?COMORBIDITIES may have co-morbidities  that affects occupational performance. Patient will benefit from skilled OT to address above impairments and improve overall function. ? ?MODIFICATION OR ASSISTANCE TO COMPLETE EVALUATION: No modification of tasks or assist necessary to complete an evaluation. ? ?OT OCCUPATIONAL PROFILE AND HISTORY: Detailed assessment: Review of records and additional review of physical, cognitive, psychosocial history related to current functional performance. ? ?CLINICAL DECISION MAKING: LOW - limited treatment options, no task modification necessary ? ?REHAB POTENTIAL: Good ? ?EVALUATION COMPLEXITY: Low ? ? ? ?PLAN: ?OT FREQUENCY: 2x/week ? ?OT DURATION: 10 weeks, plus eval ? ?PLANNED INTERVENTIONS: self care/ADL training, therapeutic exercise, therapeutic activity, neuromuscular re-education, manual therapy, passive range of motion, functional mobility training, aquatic  therapy, moist heat, cryotherapy, patient/family education, coping strategies training, and DME and/or AE instructions ? ?RECOMMENDED OTHER SERVICES: NONE ? ?CONSULTED AND AGREED WITH PLAN OF CARE: Patient ? ?PLAN FOR NEXT SESSION: assess taping, reapply tape next Wed if it helped, continue LUE reaching, coordination, NMR.  ? ?Redmond Baseman, OTR/L ?02/26/22 8:46 AM ?Phone [phone] ?FAX [phone] ? ?

## 2022-02-26 NOTE — Therapy (Signed)
?OUTPATIENT PHYSICAL THERAPY TREATMENT NOTE ? ? ?Patient Name: Kristy Abbott ?MRN: [id number] ?DOB:1966-08-30, 56 y.o., female ?Today's Date: 02/26/2022 ? ?PCP: Monico Blitz, MD ?REFERRING PROVIDER: Cherene Altes, MD   ? ?END OF SESSION:  ? PT End of Session - 02/26/22 0933   ? ? Visit Number 7   ? Number of Visits 17   Plus eval  ? Date for PT Re-Evaluation 04/30/22   ? Authorization Type [employer]   ? PT Start Time 0930   ? PT Stop Time 1011   ? PT Time Calculation (min) 41 min   ? Equipment Utilized During Treatment Gait belt   ? Activity Tolerance Patient tolerated treatment well   ? Behavior During Therapy [facility] for tasks assessed/performed   ? ?  ?  ? ?  ? ? ? ? ? ?Past Medical History:  ?Diagnosis Date  ? Anemia   ? Graves disease   ? Hypothyroidism   ? ?Past Surgical History:  ?Procedure Laterality Date  ? CESAREAN SECTION    ? COLONOSCOPY N/A 11/10/2018  ? Procedure: COLONOSCOPY;  Surgeon: Danie Binder, MD;  [facility];  Service: Endoscopy;  Laterality: N/A;  1:30  ? Cyst removed from neck    ? IR ANGIO INTRA EXTRACRAN SEL COM CAROTID INNOMINATE BILAT MOD SED  01/28/2022  ? IR ANGIO VERTEBRAL SEL SUBCLAVIAN INNOMINATE UNI L MOD SED  01/28/2022  ? IR ANGIO VERTEBRAL SEL VERTEBRAL UNI R MOD SED  01/28/2022  ? IR US GUIDE VASC ACCESS RIGHT  01/28/2022  ? TOTAL ABDOMINAL HYSTERECTOMY  04/07/2019  ? Dr. Gari Crown  ? ?Patient Active Problem List  ? Diagnosis Date Noted  ? Stroke-like symptom   ? Left-sided weakness   ? History of CVA (cerebrovascular accident)   ? Neck pain 01/28/2022  ? Hyperlipidemia 01/28/2022  ? Headache 01/27/2022  ? Hypothyroidism 01/27/2022  ? GERD (gastroesophageal reflux disease) 01/27/2022  ? Dizziness 01/27/2022  ? CVA (cerebral vascular accident) ([place]) 01/27/2022  ? 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 Special screening for malignant neoplasms, colon   ? ? ?REFERRING DIAG: I77.74 (ICD-10-CM) - Vertebral artery dissection (HCC  ? ?THERAPY DIAG:  ?Other  abnormalities of gait and mobility ? ?Unsteadiness on feet ? ?Muscle weakness (generalized) ? ?PERTINENT HISTORY: history of anemia and Graves' disease who presented to the ER at [facility] with dizziness and was found on work-up to have a left vertebral artery occlusion with an acute cerebellar stroke. hypothyroidism, GERD, iron deficiency  ? ?PRECAUTIONS: Fall  ? ?There were no vitals filed for this visit. ?  ? ?SUBJECTIVE: Pt reports she has bruising of her lips, suspects it was caused by trying to drink a thick smoothie through a straw yesterday. No pain in lips but they "feel numb". States exercises are still challenging.  ? ?PAIN:  ?Are you having pain? Yes ?NPRS scale: 3/10 ?Pain location: lower back and cervical spine ?Pain orientation: Left  ?PAIN TYPE: acute ?Pain description: intermittent and sharp  ?Aggravating factors: certain movements ?Relieving factors: Tylenol, heat   ? ?VITALS: All BP readings taken in LUE while seated  ?-Pre-session: 125/88 mmHg, HR 71 bpm  ? ?  ? ?TODAY'S TREATMENT: ?Gait Training  ?Gait pattern: step through pattern, decreased arm swing- Left, decreased step length- Right, decreased stance time- Left, decreased hip/knee flexion- Left, decreased ankle dorsiflexion- Left, Right foot flat, and poor foot clearance- Right ?Distance walked: >1,000' outside around building on sidewalk  ?Assistive device utilized: None ?  Level of assistance: Modified independence ?Comments: Pt ambulated full lap around building to practice ambulating on unlevel surfaces w/minor inclines and declines. No LOB noted and pt demonstrated good cadence throughout. Noted decreased stance time of LLE resulting in decreased step/length clearance of RLE. At this time, encouraged pt to begin ambulating at home and in community without [facility] for continued stamina practiced and return to PLOF.  ? ?NMR  ?Fwd and retro walking (230' each direction) w/ball toss (2 therapists present) for dual-taking, reaching out of BOS  and balance. S* throughout. Pt maintained balance and cadence well and did not require cues for direction around gym. Pt reported 5/10 dizziness following activity.  ? ?In // bars for lateral weightshifting, single leg stability and dynamic standing balance: ?-Standing on rocker board in A/P direction x3 minutes without UE support. Added single alt. Cone tap for LE coordination and single leg balance, x15 per side. Progressed to 2 cone taps with single leg for increased singe leg stance, x10 per side. No UE support throughout.  ?-Arranged rocker board to L/R direction and pt stood without UE support for 5 minutes. Noted significant lean to R side and used mirror for visual biofeedback for midline orientation. Pt able to shift to R side w/visual cues.  ? ? ?PATIENT EDUCATION: ?Education details: Discontinuing use of cane unless pt feels as though she needs it ?Person educated: Patient ?Education method: Explanation ?Education comprehension: verbalized understanding and needs further education ?  ?  ?HOME EXERCISE PROGRAM: ?Access Code: [email or web address] ?URL: [email or web address] ?Date: 02/19/2022 ?Prepared by: Willow Ora ? ?Exercises ?- Standing Near Stance in [place] with Eyes Closed  - 1 x daily - 5 x weekly - 1 sets - 3 reps - 30 seconds hold ?- Sit to stand with right foot elevated on small step/book   - 1 x daily - 7 x weekly - 3 sets - 10 reps ?- Side Stepping with Resistance at Thighs and Counter Support  - 1 x daily - 7 x weekly - 3 sets - 10 reps ?- Forward Backward Monster Walk with Band at [employer] and [employer]  - 1 x daily - 7 x weekly - 3 sets - 10 reps ?- Standing Hip Abduction with Counter Support  - 1 x daily - 7 x weekly - 3 sets - 10 reps ?- Standing Hip Extension with Counter Support  - 1 x daily - 7 x weekly - 3 sets - 10 reps ? ?Added to HEP on 02/19/22: ?- Supine Posterior Pelvic Tilt  - 1 x daily - 5 x weekly - 1 sets - 10 reps - 5 seconds hold ?- Supine Double Knee to Chest  - 1 x  daily - 5 x weekly - 1 sets - 3 reps - 30 econds hold ?- Supine Lower Trunk Rotation  - 1 x daily - 5 x weekly - 1 sets - 3 reps - 30 seconds hold ?- Seated Hamstring Stretch  - 1 x daily - 5 x weekly - 1 sets - 3 reps - 30 seconds hold  ?  ?  ?GOALS: ?Goals reviewed with patient? Yes ?  ?SHORT TERM GOALS: Target date: 03/05/2022 ?  ?Pt will be independent with independent HEP for improved strength, balance, transfers and gait. ?  Baseline: ?Goal status: INITIAL ?  ?2.  Pt will improve 5 x STS to less than or equal to 21 seconds without BUE support and minimal dizziness to demonstrate improved functional strength and transfer efficiency.   ?Baseline:  25.91s without BUE support, max dizziness  ?Goal status: INITIAL ?  ?3.  Pt will improve Berg score to 47/56 for decreased fall risk ?  Baseline: 42/56 ?Goal status: INITIAL ?  ?4.  Pt will improve gait velocity to at least 2.4 ft/s with LRAD for improved gait efficiency and functional mobility ?  Baseline: 2.06 ft/s without AD ?Goal status: INITIAL ?  ?  ?  ?LONG TERM GOALS: Target date: 04/02/2022 ?  ?Pt will be independent with final HEP for improved strength, balance, transfers and gait. ?  Baseline:  ?Goal status: INITIAL ?  ?2.  Pt will ambulate greater than or equal to 1,000 feet on 6MWT with LRAD mod I for improved cardiovascular endurance and BLE strength.  ?Baseline: 23' w/SBQC and CGA ?Goal status: INITIAL ?  ?3.  Pt will improve Berg score to 51/56 for decreased fall risk ?  Baseline: 42/56 ?Goal status: INITIAL ?  ?4.  Pt will improve 5 x STS to less than or equal to 17 seconds to demonstrate improved functional strength and transfer efficiency.  ?  Baseline: 25.91s without BUE support  ?Goal status: INITIAL ?  ?5.  Pt will improve gait velocity to at least 2.8 ft/s with LRAD for improved gait efficiency and performance at community ambulator level  ?  Baseline: 2.04 ft/s without AD ?Goal status: INITIAL ?  ?  ?ASSESSMENT: ?  ?CLINICAL IMPRESSION: ?Emphasis  of skilled PT session on dual-tasking, gait on unlevel surfaces and midline orientation. Pt encouraged to discontinue use of cane unless she is having an off day, as she demonstrates good balance on unlevel su

## 2022-02-28 NOTE — Progress Notes (Incomplete)
Patient Name: Kristy Abbott ?MRN: [id number] ?DOB:[birth year], 56 y.o., female ?Today's Date: 02/28/2022 ? ?Testing....testing....testing.... ? ? ?Kerrie Pleasure, PT ?02/28/2022, 4:05 PM  ?

## 2022-03-03 ENCOUNTER — Encounter: Payer: Self-pay | Admitting: Occupational Therapy

## 2022-03-03 ENCOUNTER — Ambulatory Visit: Payer: 59 | Admitting: Physical Therapy

## 2022-03-03 ENCOUNTER — Ambulatory Visit: Payer: 59 | Admitting: Occupational Therapy

## 2022-03-03 DIAGNOSIS — R2689 Other abnormalities of gait and mobility: Secondary | ICD-10-CM

## 2022-03-03 DIAGNOSIS — M25612 Stiffness of left shoulder, not elsewhere classified: Secondary | ICD-10-CM

## 2022-03-03 DIAGNOSIS — M6281 Muscle weakness (generalized): Secondary | ICD-10-CM

## 2022-03-03 DIAGNOSIS — R278 Other lack of coordination: Secondary | ICD-10-CM

## 2022-03-03 DIAGNOSIS — R2681 Unsteadiness on feet: Secondary | ICD-10-CM

## 2022-03-03 DIAGNOSIS — M25512 Pain in left shoulder: Secondary | ICD-10-CM | POA: Diagnosis not present

## 2022-03-03 NOTE — Therapy (Signed)
?OUTPATIENT PHYSICAL THERAPY TREATMENT NOTE ? ? ?Patient Name: ELEXA KIVI ?MRN: [id number] ?DOB:1966-01-02, 56 y.o., female ?Today's Date: 03/03/2022 ? ?PCP: Monico Blitz, MD ?REFERRING PROVIDER: Cherene Altes, MD   ? ?END OF SESSION:  ? PT End of Session - 03/03/22 0848   ? ? Visit Number 8   ? Number of Visits 17   Plus eval  ? Date for PT Re-Evaluation 04/30/22   ? Authorization Type [employer]   ? PT Start Time [phone]   ? PT Stop Time 0931   ? PT Time Calculation (min) 44 min   ? Equipment Utilized During Treatment --   ? Activity Tolerance Treatment limited secondary to medical complications (Comment);Patient limited by pain   Dizziness  ? Behavior During Therapy [facility] for tasks assessed/performed   ? ?  ?  ? ?  ? ? ? ? ? ? ?Past Medical History:  ?Diagnosis Date  ? Anemia   ? Graves disease   ? Hypothyroidism   ? ?Past Surgical History:  ?Procedure Laterality Date  ? CESAREAN SECTION    ? COLONOSCOPY N/A 11/10/2018  ? Procedure: COLONOSCOPY;  Surgeon: Danie Binder, MD;  [facility];  Service: Endoscopy;  Laterality: N/A;  1:30  ? Cyst removed from neck    ? IR ANGIO INTRA EXTRACRAN SEL COM CAROTID INNOMINATE BILAT MOD SED  01/28/2022  ? IR ANGIO VERTEBRAL SEL SUBCLAVIAN INNOMINATE UNI L MOD SED  01/28/2022  ? IR ANGIO VERTEBRAL SEL VERTEBRAL UNI R MOD SED  01/28/2022  ? IR US GUIDE VASC ACCESS RIGHT  01/28/2022  ? TOTAL ABDOMINAL HYSTERECTOMY  04/07/2019  ? Dr. Gari Crown  ? ?Patient Active Problem List  ? Diagnosis Date Noted  ? Stroke-like symptom   ? Left-sided weakness   ? History of CVA (cerebrovascular accident)   ? Neck pain 01/28/2022  ? Hyperlipidemia 01/28/2022  ? Headache 01/27/2022  ? Hypothyroidism 01/27/2022  ? GERD (gastroesophageal reflux disease) 01/27/2022  ? Dizziness 01/27/2022  ? CVA (cerebral vascular accident) ([place]) 01/27/2022  ? 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 Special screening for malignant neoplasms, colon   ? ? ?REFERRING DIAG: I77.74 (ICD-10-CM) -  Vertebral artery dissection (HCC  ? ?THERAPY DIAG:  ?Unsteadiness on feet ? ?Other abnormalities of gait and mobility ? ?Muscle weakness (generalized) ? ?PERTINENT HISTORY: history of anemia and Graves' disease who presented to the ER at [facility] with dizziness and was found on work-up to have a left vertebral artery occlusion with an acute cerebellar stroke. hypothyroidism, GERD, iron deficiency  ? ?PRECAUTIONS: Fall  ? ?There were no vitals filed for this visit. ?  ? ?SUBJECTIVE: Pt reports she is very tired today, tearful throughout subjective. Pt has doctors appointment today to discuss return to work, does not feel as though she is ready. Exercises are going well, still challenging. Pt not sleeping well due to continued neck/back pain. Reports being more dizzy this weekend and today.  ? ?PAIN:  ?Are you having pain? Yes ?NPRS scale: 5/10 ?Pain location: lower back and cervical spine ?Pain orientation: Left  ?PAIN TYPE: acute ?Pain description: intermittent and sharp  ?Aggravating factors: certain movements ?Relieving factors: Tylenol, heat   ? ?VITALS: All BP readings taken in LUE while seated  ?-Pre-session: BP 117/79 mmHg, HR 63 bpm  ? ?  ? ?TODAY'S TREATMENT: ?Ther Act  ?STG Assessment  ? [facility] PT Assessment - 03/03/22 0857   ? ?  ? Transfers  ? Five time sit  to stand comments  13.06 without BUE support and mild dizziness   ?  ? Ambulation/Gait  ? Gait velocity 32.8' over 9.47s = 3.46 ft/s without AD   ? ?  ?  ? ?  ? ? ?Ther Ex/ Manual Therapy   ?Scifit multi-peak level 3 for 8 minutes with BUE/BLEs for dynamic cardiovascular warmup, UE/LE coordination and endurance. Pt alternating between BUE support and RUE only throughout. Pt reported 6/10 dizziness following activity and required lengthy seated rest break and water to recover prior to walking to mat.  ? ?Supine on mat, performed suboccipital release and cervical paraspinal massage x10 minutes for improved neck ROM and pain modulation. Pt very tender and  reported significant pain w/manual therapy below C4, added oscillation for added comfort.  ? ?Supine chin tucks x20 for improved cervical ROM and pain modulation. Pt reported 10/10 dizziness following supine <>sit on mat. After several minutes, reported dizziness as 5/10.  ? ? ?Gait Training  ?Gait pattern: step through pattern, decreased arm swing- Left, decreased step length- Right, decreased stance time- Left, Right foot flat, and poor foot clearance- Right ?Distance walked: Various clinic distances  ?Assistive device utilized: None ?Level of assistance: Modified independence ?Comments: Pt ambulating with increased cadence throughout session and noted more symmetrical step length compared to previous sessions.  ? ?PATIENT EDUCATION: ?Education details: POC moving forward- discussed significant improvement in functional performance and pt being in control of her therapy. If she does not feel as though she needs all her visits, she does not have to use them. STG assessment outcomes and provided encouragement, as pt very hard on herself today.  ?Person educated: Patient ?Education method: Explanation ?Education comprehension: verbalized understanding and needs further education ?  ?  ?HOME EXERCISE PROGRAM: ?Access Code: [email or web address] ?URL: [email or web address] ?Date: 02/19/2022 ?Prepared by: Willow Ora ? ?Exercises ?- Standing Near Stance in [place] with Eyes Closed  - 1 x daily - 5 x weekly - 1 sets - 3 reps - 30 seconds hold ?- Sit to stand with right foot elevated on small step/book   - 1 x daily - 7 x weekly - 3 sets - 10 reps ?- Side Stepping with Resistance at Thighs and Counter Support  - 1 x daily - 7 x weekly - 3 sets - 10 reps ?- Forward Backward Monster Walk with Band at [employer] and [employer]  - 1 x daily - 7 x weekly - 3 sets - 10 reps ?- Standing Hip Abduction with Counter Support  - 1 x daily - 7 x weekly - 3 sets - 10 reps ?- Standing Hip Extension with Counter Support  - 1 x daily - 7  x weekly - 3 sets - 10 reps ? ?Added to HEP on 02/19/22: ?- Supine Posterior Pelvic Tilt  - 1 x daily - 5 x weekly - 1 sets - 10 reps - 5 seconds hold ?- Supine Double Knee to Chest  - 1 x daily - 5 x weekly - 1 sets - 3 reps - 30 econds hold ?- Supine Lower Trunk Rotation  - 1 x daily - 5 x weekly - 1 sets - 3 reps - 30 seconds hold ?- Seated Hamstring Stretch  - 1 x daily - 5 x weekly - 1 sets - 3 reps - 30 seconds hold  ?  ?  ?GOALS: ?Goals reviewed with patient? Yes ?  ?SHORT TERM GOALS: Target date: 03/05/2022 ?  ?Pt will be independent with independent HEP for improved strength,  balance, transfers and gait. ?  Baseline: ?Goal status: MET ?  ?2.  Pt will improve 5 x STS to less than or equal to 21 seconds without BUE support and minimal dizziness to demonstrate improved functional strength and transfer efficiency.   ?Baseline: 25.91s without BUE support, max dizziness; 13.06s without BUE support, mild dizziness that dissipated quickly  ?Goal status: MET ?  ?3.  Pt will improve Berg score to 47/56 for decreased fall risk ?  Baseline: 42/56 ?Goal status: INITIAL ?  ?4.  Pt will improve gait velocity to at least 2.4 ft/s with LRAD for improved gait efficiency and functional mobility ?  Baseline: 2.06 ft/s without AD; 3.46 ft/s without AD on 5/15  ?Goal status: MET  ?  ?  ?  ?LONG TERM GOALS: Target date: 04/02/2022 ?  ?Pt will be independent with final HEP for improved strength, balance, transfers and gait. ?  Baseline:  ?Goal status: INITIAL ?  ?2.  Pt will ambulate greater than or equal to 1,000 feet on 6MWT with LRAD mod I for improved cardiovascular endurance and BLE strength.  ?Baseline: 78' w/SBQC and CGA ?Goal status: INITIAL ?  ?3.  Pt will improve Berg score to 51/56 for decreased fall risk ?  Baseline: 42/56 ?Goal status: INITIAL ?  ?4.  Pt will improve 5 x STS to less than or equal to 10 seconds without BUE support without dizziness to demonstrate improved functional strength and transfer efficiency.  ?   Baseline: 25.91s without BUE support; 13.06s without UE support and mild dizziness on 5/15  ?Goal status: REVISED  ?  ?5.  Pt will improve gait velocity to at least 3.8 ft/s with LRAD for improved gait e

## 2022-03-03 NOTE — Therapy (Signed)
?OUTPATIENT OCCUPATIONAL THERAPY TREATMENT NOTE ? ? ?Patient Name: Kristy Abbott ?MRN: [id number] ?DOB:January 25, 1966, 56 y.o., female ?Today's Date: 03/03/2022 ? ?PCP: Monico Blitz, MD ?REFERRING PROVIDER: Monico Blitz, MD ? ?END OF SESSION:  ? OT End of Session - 03/03/22 0939   ? ? Visit Number 8   ? Number of Visits 21   ? Date for OT Re-Evaluation 04/16/22   ? Authorization Type [employer] - VL [id number]   ? Authorization - Visit Number 8   ? Authorization - Number of Visits 30   ? OT Start Time 0932   ? OT Stop Time 1000 (left early for MD appt)  ? OT Time Calculation (min) 28 min   ? Activity Tolerance Patient tolerated treatment well;Patient limited by pain   ? Behavior During Therapy [facility] for tasks assessed/performed   ? ?  ?  ? ?  ? ? ? ?Past Medical History:  ?Diagnosis Date  ? Anemia   ? Graves disease   ? Hypothyroidism   ? ?Past Surgical History:  ?Procedure Laterality Date  ? CESAREAN SECTION    ? COLONOSCOPY N/A 11/10/2018  ? Procedure: COLONOSCOPY;  Surgeon: Danie Binder, MD;  [facility];  Service: Endoscopy;  Laterality: N/A;  1:30  ? Cyst removed from neck    ? IR ANGIO INTRA EXTRACRAN SEL COM CAROTID INNOMINATE BILAT MOD SED  01/28/2022  ? IR ANGIO VERTEBRAL SEL SUBCLAVIAN INNOMINATE UNI L MOD SED  01/28/2022  ? IR ANGIO VERTEBRAL SEL VERTEBRAL UNI R MOD SED  01/28/2022  ? IR US GUIDE VASC ACCESS RIGHT  01/28/2022  ? TOTAL ABDOMINAL HYSTERECTOMY  04/07/2019  ? Dr. Gari Crown  ? ?Patient Active Problem List  ? Diagnosis Date Noted  ? Stroke-like symptom   ? Left-sided weakness   ? History of CVA (cerebrovascular accident)   ? Neck pain 01/28/2022  ? Hyperlipidemia 01/28/2022  ? Headache 01/27/2022  ? Hypothyroidism 01/27/2022  ? GERD (gastroesophageal reflux disease) 01/27/2022  ? Dizziness 01/27/2022  ? CVA (cerebral vascular accident) ([place]) 01/27/2022  ? 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 Special screening for malignant neoplasms, colon   ? ? ?ONSET DATE: 01/26/22 ? ?REFERRING DIAG:  s/p vertebral  artery occlusion and Lt cerebellar stroke  ? ?THERAPY DIAG:  ?Stiffness of left shoulder, not elsewhere classified ? ?Other lack of coordination ? ?Muscle weakness (generalized) ? ? ?PERTINENT HISTORY: Patient is a 56 y.o. 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 PMH: Hyperlipidemia, GERD (gastroesophageal reflux disease) ? ?PRECAUTIONS: No driving, no lifting >5 lbs ? ?SUBJECTIVE: The tape helped but I had to take it off the next day because it started itching (therapist noted red splotches on skin today) ? ?PAIN:  ?Are you having pain? Yes: NPRS scale: 5/10 ?Pain location: neck and lower back ?Pain description: dull, sore, aching ?Aggravating factors: movement ?Relieving factors: relaxing, heat, tylenol ? ? ? ? ?OBJECTIVE: (Rt handed) ? ?TODAY'S TREATMENT: ? ?Discussed options for kinesiotape for sensitive skin since pt had skin reaction, but it did help with pain. Pt instructed to bring in Maalox next session to apply prior to kinesiotape to see if this helps skin. ?Practiced tying/untying shoes for coordination/bilateral integration I'ly.  ? ?Pt performing functional high level reaching to place graded clothespins (red to black resistance) on antenna and back LUE.  ? ?BUE AA/ROM in high flexion w/ ball sliding up wall for NMR followed  by scapula strengthening lifting arm (alternating arms) off ball while in high flexion w/ mod facilitation LUE ? ?UBE x 5 min, level 2 resistance (backwards)  ? ?Pt had to leave early for MD appt ? ? ?PATIENT EDUCATION: ?02/24/22: Education details: Prone posterior sh girdle strengthening HEP  ?Person educated: Patient ?Education method: Explanation, Demonstration, and Handouts ?Education comprehension: verbalized understanding, returned demonstration, and verbal cues required ? ? ? ? ? ? ?HOME EXERCISE PROGRAMS: ?Cane exercises supine, putty HEP 02/06/22 ?Coordination HEP  02/10/22 ?Prone sh HEP 02/24/22 ? ? ?GOALS: ? ? ?SHORT TERM GOALS: Target date: 03/05/2022 ? ?Pt will be independent with HEP targeting Lt shoulder pain and ROM ? ?Baseline: ?Goal status: MET ? ?2.  Pt to cut food mod I w/ A/E prn ?Baseline:  ?Goal status: MET in clinic using Lt hand as stabilizer w/ regular utensils ? ?3.  Pt to simulate tub transfers safely w/ DME prn and close sup ?Baseline:  ?Goal status: MET ? ?4.  Pt to improve functional use of LUE by performing 28 or greater on [employer] ?Baseline: 19 ?Goal status: MET (02/24/22: 41) ? ?5.  Pt to improve grip strength Lt hand to 35 lbs or greater for opening jars/containers ?Baseline: 24.4 lbs (Rt = 66 lbs) ?Goal status: MET (02/24/22: 43.2 LBS)  ? ? ?LONG TERM GOALS: Target date: 04/16/2022 ? ?Independent with HEP for grip strength and coordination LT hand ?Baseline:  ?Goal status: MET ? ?2.  Pt to return to cooking simple meal at distant supervision  ?Baseline: dependent  ?Goal status: ONGOING w/ direct supervision ? ?3.  Pt able to pick up and care for puppy using BUE's safely ?Baseline: unable ?Goal status: ONGOING ? ?4.  Pt to improve coordination as evidenced by performing 9 hole peg test in 28 sec or less ?Baseline: 34 sec (Rt = 18 sec) ?Goal status: MET (02/24/22: 25.47 sec)  ? ?5.  Pt to improve LUE shoulder ROM by reaching into high shelf for light weight object w/ pain less than or equal to 3/10 ?Baseline: approx 90* w/ pain 6/10 ?Goal status: ONGOING ? ?6.  Pt to demo 90% shoulder movement for all functional tasks w/ pain 3/10 or under ?Baseline:  ?Goal status: ONGOING ? ?ASSESSMENT: ? ?CLINICAL IMPRESSION: ?Pt has met all STG's and 2 LTG's. Pt progressing w/ coordination, Lt sh ROM and pain. Pt's pain in Lt shoulder has improved significantly but still has pain at neck along upper traps on Lt side. ? ?PERFORMANCE DEFICITS in functional skills including ADLs, IADLs, coordination, sensation, ROM, strength, pain, FMC, mobility, body mechanics, decreased  knowledge of use of DME, and UE functional use ? ?IMPAIRMENTS are limiting patient from ADLs, IADLs, rest and sleep, work, leisure, and social participation.  ? ?COMORBIDITIES may have co-morbidities  that affects occupational performance. Patient will benefit from skilled OT to address above impairments and improve overall function. ? ?MODIFICATION OR ASSISTANCE TO COMPLETE EVALUATION: No modification of tasks or assist necessary to complete an evaluation. ? ?OT OCCUPATIONAL PROFILE AND HISTORY: Detailed assessment: Review of records and additional review of physical, cognitive, psychosocial history related to current functional performance. ? ?CLINICAL DECISION MAKING: LOW - limited treatment options, no task modification necessary ? ?REHAB POTENTIAL: Good ? ?EVALUATION COMPLEXITY: Low ? ? ? ?PLAN: ?OT FREQUENCY: 2x/week ? ?OT DURATION: 10 weeks, plus eval ? ?PLANNED INTERVENTIONS: self care/ADL training, therapeutic exercise, therapeutic activity, neuromuscular re-education, manual therapy, passive range of motion, functional mobility training, aquatic therapy, moist heat, cryotherapy,  patient/family education, coping strategies training, and DME and/or AE instructions ? ?RECOMMENDED OTHER SERVICES: NONE ? ?CONSULTED AND AGREED WITH PLAN OF CARE: Patient ? ?PLAN FOR NEXT SESSION: Try kinesiotape w/ Maalox, continue LUE reaching, coordination, NMR, UBE  ? ?Redmond Baseman, OTR/L ?03/03/22 9:41 AM ?Phone [phone] ?FAX [phone] ? ?

## 2022-03-05 ENCOUNTER — Encounter: Payer: Self-pay | Admitting: Occupational Therapy

## 2022-03-05 ENCOUNTER — Ambulatory Visit: Payer: 59 | Admitting: Physical Therapy

## 2022-03-05 ENCOUNTER — Ambulatory Visit: Payer: 59 | Admitting: Occupational Therapy

## 2022-03-05 ENCOUNTER — Encounter: Payer: Self-pay | Admitting: Physical Therapy

## 2022-03-05 VITALS — BP 94/71 | HR 58

## 2022-03-05 DIAGNOSIS — M25612 Stiffness of left shoulder, not elsewhere classified: Secondary | ICD-10-CM

## 2022-03-05 DIAGNOSIS — R208 Other disturbances of skin sensation: Secondary | ICD-10-CM

## 2022-03-05 DIAGNOSIS — R2681 Unsteadiness on feet: Secondary | ICD-10-CM

## 2022-03-05 DIAGNOSIS — M6281 Muscle weakness (generalized): Secondary | ICD-10-CM

## 2022-03-05 DIAGNOSIS — R2689 Other abnormalities of gait and mobility: Secondary | ICD-10-CM

## 2022-03-05 DIAGNOSIS — R278 Other lack of coordination: Secondary | ICD-10-CM

## 2022-03-05 DIAGNOSIS — M25512 Pain in left shoulder: Secondary | ICD-10-CM

## 2022-03-05 NOTE — Therapy (Signed)
?OUTPATIENT PHYSICAL THERAPY TREATMENT NOTE ? ? ?Patient Name: Kristy Abbott ?MRN: [id number] ?DOB:1965/12/19, 56 y.o., female ?Today's Date: 03/05/2022 ? ?PCP: Monico Blitz, MD ?REFERRING PROVIDER: Cherene Altes, MD   ? ?END OF SESSION:  ? PT End of Session - 03/05/22 0805   ? ? Visit Number 9   ? Number of Visits 17   Plus eval  ? Date for PT Re-Evaluation 04/30/22   ? Authorization Type [employer]   ? PT Start Time [phone]   ? PT Stop Time 0845   ? PT Time Calculation (min) 41 min   ? Equipment Utilized During Treatment Gait belt   ? Activity Tolerance Patient tolerated treatment well   Dizziness  ? Behavior During Therapy [facility] for tasks assessed/performed   ? ?  ?  ? ?  ? ? ? ? ? ? ?Past Medical History:  ?Diagnosis Date  ? Anemia   ? Graves disease   ? Hypothyroidism   ? ?Past Surgical History:  ?Procedure Laterality Date  ? CESAREAN SECTION    ? COLONOSCOPY N/A 11/10/2018  ? Procedure: COLONOSCOPY;  Surgeon: Danie Binder, MD;  [facility];  Service: Endoscopy;  Laterality: N/A;  1:30  ? Cyst removed from neck    ? IR ANGIO INTRA EXTRACRAN SEL COM CAROTID INNOMINATE BILAT MOD SED  01/28/2022  ? IR ANGIO VERTEBRAL SEL SUBCLAVIAN INNOMINATE UNI L MOD SED  01/28/2022  ? IR ANGIO VERTEBRAL SEL VERTEBRAL UNI R MOD SED  01/28/2022  ? IR US GUIDE VASC ACCESS RIGHT  01/28/2022  ? TOTAL ABDOMINAL HYSTERECTOMY  04/07/2019  ? Dr. Gari Crown  ? ?Patient Active Problem List  ? Diagnosis Date Noted  ? Stroke-like symptom   ? Left-sided weakness   ? History of CVA (cerebrovascular accident)   ? Neck pain 01/28/2022  ? Hyperlipidemia 01/28/2022  ? Headache 01/27/2022  ? Hypothyroidism 01/27/2022  ? GERD (gastroesophageal reflux disease) 01/27/2022  ? Dizziness 01/27/2022  ? CVA (cerebral vascular accident) ([place]) 01/27/2022  ? 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 Special screening for malignant neoplasms, colon   ? ? ?REFERRING DIAG: I77.74 (ICD-10-CM) - Vertebral artery dissection (HCC  ? ?THERAPY DIAG:   ?Muscle weakness (generalized) ? ?Unsteadiness on feet ? ?Other abnormalities of gait and mobility ? ?PERTINENT HISTORY: history of anemia and Graves' disease who presented to the ER at [facility] with dizziness and was found on work-up to have a left vertebral artery occlusion with an acute cerebellar stroke. hypothyroidism, GERD, iron deficiency  ? ?PRECAUTIONS: Fall  ? ?Vitals:  ? 03/05/22 0809  ?BP: 94/71  ?Pulse: (!) 58  ? ?  ? ?SUBJECTIVE: No new complaints. No falls. Did not sleep well last night.  ? ?PAIN:  ?Are you having pain? Yes ?NPRS scale: 3-4/10 ?Pain location: cervical spine, sharp pain at times in her mid/thoracic spine at times ?Pain orientation: Left  ?PAIN TYPE: acute ?Pain description: intermittent and sharp  ?Aggravating factors: certain movements ?Relieving factors: Tylenol, heat   ? ? ? ?  ? ?TODAY'S TREATMENT: ?03/05/2022 ? [facility] PT Assessment - 03/05/22 0811   ? ?  ? Berg Balance Test  ? Sit to Stand Able to stand without using hands and stabilize independently   ? Standing Unsupported Able to stand safely 2 minutes   ? Sitting with Back Unsupported but Feet Supported on Floor or Stool Able to sit safely and securely 2 minutes   ? Stand to Sit Sits safely with minimal  use of hands   ? Transfers Able to transfer safely, minor use of hands   ? Standing Unsupported with Eyes Closed Able to stand 10 seconds safely   ? Standing Unsupported with Feet Together Able to place feet together independently and stand 1 minute safely   ? From Standing, Reach Forward with Outstretched Arm Can reach confidently >25 cm (10")   ? From Standing Position, Pick up Object from [place] to pick up shoe safely and easily   ? From Standing Position, Turn to Look Behind Over each Shoulder Looks behind from both sides and weight shifts well   ? Turn 360 Degrees Able to turn 360 degrees safely in 4 seconds or less   ? Standing Unsupported, Alternately Place Feet on Step/Stool Able to stand independently and safely  and complete 8 steps in 20 seconds   9.47 sec's  ? Standing Unsupported, One Foot in [place] to place foot tandem independently and hold 30 seconds   ? Standing on One Leg Able to lift leg independently and hold > 10 seconds   ? Total Score 56   ? Merrilee Jansky comment: 56/56   ? ?  ?  ? ?BALANCE/NMR: ? Gait around track while self tossing sunshine ball and naming girl names A-Z, supervision with min cues to names and stay on task. No balance issues note. Mild lightheadedness afterwards that resolved with rest breaks. ? BP 122/89 afterwards.  ?Single Leg Stance: standing across blue foam beam with 2 tall cones on floor in front- alternating forward, cross, forward double, cross double foot taps for 10 reps each with no UE support, occasional touch to bars t o assist with balance, cues on stance position and weight shifting to assist, min guard to min assist for balance.  ?Side Stepping: on blue foam beam in mini squat position no UE support for 4 laps toward each direction with min guard to min assist, occasional touch to bars to assist with balance. Cues on posture, step length and technique. ?Tandem Walking: on blue foam beam with heel taps to floor with each step, no UE support, 4 laps each forward/backward, occasional touch to bars for balance with cues for posture and step placement on beam.    ? ? ?PATIENT EDUCATION: ?Education details:   ?Person educated: Patient ?Education method: Explanation ?Education comprehension: verbalized understanding and needs further education ?  ?  ?HOME EXERCISE PROGRAM: ?Access Code: [email or web address] ?URL: [email or web address] ?Date: 02/19/2022 ?Prepared by: Willow Ora ? ?Exercises ?- Standing Near Stance in [place] with Eyes Closed  - 1 x daily - 5 x weekly - 1 sets - 3 reps - 30 seconds hold ?- Sit to stand with right foot elevated on small step/book   - 1 x daily - 7 x weekly - 3 sets - 10 reps ?- Side Stepping with Resistance at Thighs and Counter Support  - 1 x daily - 7 x  weekly - 3 sets - 10 reps ?- Forward Backward Monster Walk with Band at [employer] and [employer]  - 1 x daily - 7 x weekly - 3 sets - 10 reps ?- Standing Hip Abduction with Counter Support  - 1 x daily - 7 x weekly - 3 sets - 10 reps ?- Standing Hip Extension with Counter Support  - 1 x daily - 7 x weekly - 3 sets - 10 reps ? ?Added to HEP on 02/19/22: ?- Supine Posterior Pelvic Tilt  - 1 x daily - 5 x weekly -  1 sets - 10 reps - 5 seconds hold ?- Supine Double Knee to Chest  - 1 x daily - 5 x weekly - 1 sets - 3 reps - 30 econds hold ?- Supine Lower Trunk Rotation  - 1 x daily - 5 x weekly - 1 sets - 3 reps - 30 seconds hold ?- Seated Hamstring Stretch  - 1 x daily - 5 x weekly - 1 sets - 3 reps - 30 seconds hold  ?  ?  ?GOALS: ?Goals reviewed with patient? Yes ?  ?SHORT TERM GOALS: Target date: 03/05/2022 ?  ?Pt will be independent with independent HEP for improved strength, balance, transfers and gait. ?  Baseline: ?Goal status: MET ?  ?2.  Pt will improve 5 x STS to less than or equal to 21 seconds without BUE support and minimal dizziness to demonstrate improved functional strength and transfer efficiency.   ?Baseline: 25.91s without BUE support, max dizziness; 13.06s without BUE support, mild dizziness that dissipated quickly  ?Goal status: MET ?  ?3.  Pt will improve Berg score to 47/56 for decreased fall risk ?  Baseline: 03/05/22- 56/56 ?Goal status: MET ?  ?4.  Pt will improve gait velocity to at least 2.4 ft/s with LRAD for improved gait efficiency and functional mobility ?  Baseline: 2.06 ft/s without AD; 3.46 ft/s without AD on 5/15  ?Goal status: MET  ?  ?  ?  ?LONG TERM GOALS: Target date: 04/02/2022 ?  ?Pt will be independent with final HEP for improved strength, balance, transfers and gait. ?  Baseline:  ?Goal status: INITIAL ?  ?2.  Pt will ambulate greater than or equal to 1,000 feet on 6MWT with LRAD mod I for improved cardiovascular endurance and BLE strength.  ?Baseline: 45' w/SBQC and CGA ?Goal  status: INITIAL ?  ?3.  Pt will improve Berg score to 51/56 for decreased fall risk ?  Baseline: 03/05/22: 56/56 ?Goal status: MET ?  ?4.  Pt will improve 5 x STS to less than or equal to 10 seconds wit

## 2022-03-05 NOTE — Therapy (Signed)
?OUTPATIENT OCCUPATIONAL THERAPY TREATMENT NOTE ? ? ?Patient Name: Kristy Abbott ?MRN: [id number] ?DOB:Oct 19, 1966, 56 y.o., female ?Today's Date: 03/05/2022 ? ?PCP: Monico Blitz, MD ?REFERRING PROVIDER: Cherene Altes, MD ? ?END OF SESSION:  ? OT End of Session - 03/03/22 0939   ? ? Visit Number 8   ? Number of Visits 21   ? Date for OT Re-Evaluation 04/16/22   ? Authorization Type [employer] - VL 60   ? Authorization - Visit Number 8   ? Authorization - Number of Visits 30   ? OT Start Time 0932   ? OT Stop Time 1000 (left early for MD appt)  ? OT Time Calculation (min) 28 min   ? Activity Tolerance Patient tolerated treatment well;Patient limited by pain   ? Behavior During Therapy [facility] for tasks assessed/performed   ? ?  ?  ? ?  ? ? ? ?Past Medical History:  ?Diagnosis Date  ? Anemia   ? Graves disease   ? Hypothyroidism   ? ?Past Surgical History:  ?Procedure Laterality Date  ? CESAREAN SECTION    ? COLONOSCOPY N/A 11/10/2018  ? Procedure: COLONOSCOPY;  Surgeon: Danie Binder, MD;  [facility];  Service: Endoscopy;  Laterality: N/A;  1:30  ? Cyst removed from neck    ? IR ANGIO INTRA EXTRACRAN SEL COM CAROTID INNOMINATE BILAT MOD SED  01/28/2022  ? IR ANGIO VERTEBRAL SEL SUBCLAVIAN INNOMINATE UNI L MOD SED  01/28/2022  ? IR ANGIO VERTEBRAL SEL VERTEBRAL UNI R MOD SED  01/28/2022  ? IR US GUIDE VASC ACCESS RIGHT  01/28/2022  ? TOTAL ABDOMINAL HYSTERECTOMY  04/07/2019  ? Dr. Gari Crown  ? ?Patient Active Problem List  ? Diagnosis Date Noted  ? Stroke-like symptom   ? Left-sided weakness   ? History of CVA (cerebrovascular accident)   ? Neck pain 01/28/2022  ? Hyperlipidemia 01/28/2022  ? Headache 01/27/2022  ? Hypothyroidism 01/27/2022  ? GERD (gastroesophageal reflux disease) 01/27/2022  ? Dizziness 01/27/2022  ? CVA (cerebral vascular accident) ([place]) 01/27/2022  ? 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 Special screening for malignant neoplasms, colon   ? ? ?ONSET DATE: 01/26/22 ? ?REFERRING DIAG:  s/p  vertebral artery occlusion and Lt cerebellar stroke  ? ?THERAPY DIAG:  ?Stiffness of left shoulder, not elsewhere classified ? ?Other lack of coordination ? ?Muscle weakness (generalized) ? ?Unsteadiness on feet ? ?Other abnormalities of gait and mobility ? ?Acute pain of left shoulder ? ?Other disturbances of skin sensation ? ? ?PERTINENT HISTORY: Patient is a 56 y.o. 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 PMH: Hyperlipidemia, GERD (gastroesophageal reflux disease) ? ?PRECAUTIONS: No driving, no lifting >5 lbs ? ?SUBJECTIVE: The tape helped but I had to take it off the next day because it started itching (therapist noted red splotches on skin today) ? ?PAIN:  ?Are you having pain? Yes: NPRS scale: 2/10 ?Pain location: neck and lower back ?Pain description: dull, sore, aching ?Aggravating factors: movement ?Relieving factors: relaxing, heat, tylenol ? ? ? ? ?OBJECTIVE: (Rt handed) ? ?TODAY'S TREATMENT: ? ?Discussed options for kinesiotape for sensitive skin since pt had skin reaction, but it did help with pain. Pt instructed to bring in Maalox next session to apply prior to kinesiotape to see if this helps skin. ? ?Pt performing functional high level reaching to place graded clothespins (red to black resistance) on antenna and back  LUE.  ? ?BUE AA/ROM in high flexion w/ ball rolling up wall for NMR followed by scapula strengthening with bilateral UE's  ? ?Standing in corner, closed chain shoulder flexion and diagonals with ball, min v.c ? ?UBE x 6 min, forwards and backwards for conditioning ? ?Standing to copy small peg design with  LUE for fine motor coordination and mid level functional reach, only occasional min difficulty ? ?Gripper set at level 2 for sustained grip to pick up 1 inch block, min difficulty. ? ?Pt reports this task aggravates left shoulder/ traps  so hot pack applied  x8 while pt placed grooved  pegs in peg board, she removed with tweezers for sustained pinch and fine motor coordination. No adverse reactions. ? ? ? ? ? ?PATIENT EDUCATION: ?03/05/22-n/a ?02/24/22: Education details: Prone posterior sh girdle strengthening HEP  ?Person educated: Patient ?Education method: Explanation, Demonstration, and Handouts ?Education comprehension: verbalized understanding, returned demonstration, and verbal cues required ? ? ? ? ? ? ?HOME EXERCISE PROGRAMS: ?Cane exercises supine, putty HEP 02/06/22 ?Coordination HEP 02/10/22 ?Prone sh HEP 02/24/22 ? ? ?GOALS: ? ? ?SHORT TERM GOALS: Target date: 03/05/2022 ? ?Pt will be independent with HEP targeting Lt shoulder pain and ROM ? ?Baseline: ?Goal status: MET ? ?2.  Pt to cut food mod I w/ A/E prn ?Baseline:  ?Goal status: MET in clinic using Lt hand as stabilizer w/ regular utensils ? ?3.  Pt to simulate tub transfers safely w/ DME prn and close sup ?Baseline:  ?Goal status: MET ? ?4.  Pt to improve functional use of LUE by performing 28 or greater on Box & Blocks ?Baseline: 19 ?Goal status: MET (02/24/22: 41) ? ?5.  Pt to improve grip strength Lt hand to 35 lbs or greater for opening jars/containers ?Baseline: 24.4 lbs (Rt = 66 lbs) ?Goal status: MET (02/24/22: 43.2 LBS)  ? ? ?LONG TERM GOALS: Target date: 04/16/2022 ? ?Independent with HEP for grip strength and coordination LT hand ?Baseline:  ?Goal status: MET ? ?2.  Pt to return to cooking simple meal at distant supervision  ?Baseline: dependent  ?Goal status: ONGOING w/ direct supervision ? ?3.  Pt able to pick up and care for puppy using BUE's safely ?Baseline: unable ?Goal status: ONGOING ? ?4.  Pt to improve coordination as evidenced by performing 9 hole peg test in 28 sec or less ?Baseline: 34 sec (Rt = 18 sec) ?Goal status: MET (02/24/22: 25.47 sec)  ? ?5.  Pt to improve LUE shoulder ROM by reaching into high shelf for light weight object w/ pain less than or equal to 3/10 ?Baseline: approx 90* w/ pain 6/10 ?Goal status:  ONGOING ? ?6.  Pt to demo 90% shoulder movement for all functional tasks w/ pain 3/10 or under ?Baseline:  ?Goal status: ONGOING ? ?ASSESSMENT: ? ?CLINICAL IMPRESSION: ?Pt demonstrates excellent overall progress with LUE ROM and functional use.Pt. still has pain at neck along upper traps on Lt side. ? ?PERFORMANCE DEFICITS in functional skills including ADLs, IADLs, coordination, sensation, ROM, strength, pain, FMC, mobility, body mechanics, decreased knowledge of use of DME, and UE functional use ? ?IMPAIRMENTS are limiting patient from ADLs, IADLs, rest and sleep, work, leisure, and social participation.  ? ?COMORBIDITIES may have co-morbidities  that affects occupational performance. Patient will benefit from skilled OT to address above impairments and improve overall function. ? ?MODIFICATION OR ASSISTANCE TO COMPLETE EVALUATION: No modification of tasks or assist necessary to complete an evaluation. ? ?OT OCCUPATIONAL PROFILE AND HISTORY: Detailed  assessment: Review of records and additional review of physical, cognitive, psychosocial history related to current functional performance. ? ?CLINICAL DECISION MAKING: LOW - limited treatment options, no task modification necessary ? ?REHAB POTENTIAL: Good ? ?EVALUATION COMPLEXITY: Low ? ? ? ?PLAN: ?OT FREQUENCY: 2x/week ? ?OT DURATION: 10 weeks, plus eval ? ?PLANNED INTERVENTIONS: self care/ADL training, therapeutic exercise, therapeutic activity, neuromuscular re-education, manual therapy, passive range of motion, functional mobility training, aquatic therapy, moist heat, cryotherapy, patient/family education, coping strategies training, and DME and/or AE instructions ? ?RECOMMENDED OTHER SERVICES: NONE ? ?CONSULTED AND AGREED WITH PLAN OF CARE: Patient ? ?PLAN FOR NEXT SESSION: Try kinesiotape w/ Maalox, continue LUE reaching, coordination, NMR, UBE  ? ?Curt Bears Karagan Lehr, OTR/L ?[phone] ?Phone: [phone] ?8:59 AM 03/05/22  ?

## 2022-03-10 ENCOUNTER — Ambulatory Visit: Payer: 59 | Admitting: Physical Therapy

## 2022-03-10 ENCOUNTER — Encounter: Payer: Self-pay | Admitting: Occupational Therapy

## 2022-03-10 ENCOUNTER — Ambulatory Visit: Payer: 59 | Admitting: Occupational Therapy

## 2022-03-10 VITALS — BP 116/76 | HR 55

## 2022-03-10 DIAGNOSIS — M25612 Stiffness of left shoulder, not elsewhere classified: Secondary | ICD-10-CM

## 2022-03-10 DIAGNOSIS — R2689 Other abnormalities of gait and mobility: Secondary | ICD-10-CM

## 2022-03-10 DIAGNOSIS — M6281 Muscle weakness (generalized): Secondary | ICD-10-CM

## 2022-03-10 DIAGNOSIS — R278 Other lack of coordination: Secondary | ICD-10-CM

## 2022-03-10 DIAGNOSIS — R2681 Unsteadiness on feet: Secondary | ICD-10-CM

## 2022-03-10 DIAGNOSIS — M25512 Pain in left shoulder: Secondary | ICD-10-CM | POA: Diagnosis not present

## 2022-03-10 NOTE — Therapy (Signed)
OUTPATIENT OCCUPATIONAL THERAPY TREATMENT NOTE   Patient Name: Kristy Abbott MRN: [id number] DOB:10-13-1966, 56 y.o., female Today's Date: 03/10/2022  PCP: Monico Blitz, MD REFERRING PROVIDER: Cherene Altes, MD      OT End of Session - 03/10/22 1011     Visit Number 10    Number of Visits 21    Date for OT Re-Evaluation 04/16/22    Authorization Type [employer] - VL 61    Authorization - Visit Number 10    Authorization - Number of Visits 30    OT Start Time 0932    OT Stop Time 1015    OT Time Calculation (min) 43 min    Activity Tolerance Patient tolerated treatment well    Behavior During Therapy WFL for tasks assessed/performed                  Past Medical History:  Diagnosis Date   Anemia    Graves disease    Hypothyroidism    Past Surgical History:  Procedure Laterality Date   CESAREAN SECTION     COLONOSCOPY N/A 11/10/2018   Procedure: COLONOSCOPY;  Surgeon: Danie Binder, MD;  [facility];  Service: Endoscopy;  Laterality: N/A;  1:30   Cyst removed from neck     IR ANGIO INTRA EXTRACRAN SEL COM CAROTID INNOMINATE BILAT MOD SED  01/28/2022   IR ANGIO VERTEBRAL SEL SUBCLAVIAN INNOMINATE UNI L MOD SED  01/28/2022   IR ANGIO VERTEBRAL SEL VERTEBRAL UNI R MOD SED  01/28/2022   IR US GUIDE VASC ACCESS RIGHT  01/28/2022   TOTAL ABDOMINAL HYSTERECTOMY  04/07/2019   Dr. 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 PMH: Hyperlipidemia, GERD (gastroesophageal reflux disease)  PRECAUTIONS: No driving  SUBJECTIVE: I even picked my dog up the other day  PAIN:  Are you having pain? Yes: NPRS scale: 3/10 Pain location: neck  Pain description: dull, sore, aching Aggravating factors: movement Relieving factors: relaxing, heat, tylenol     OBJECTIVE: (Rt handed)  TODAY'S TREATMENT:  Practiced reaching and retrieving 2 lb weight from high shelf and replacing x 5 reps LUE. Practiced reaching for large baking dish w/ BUE's from mid level shelf x 5 reps. Pt only reports neck pain 3/10, no increase in pain w/ above activities and no shoulder pain.   BUE AA/ROM w/ ball along wall for high level shoulder flex x 10 followed by scapula retraction to lift UE off ball alternating arms x 3 reps each side  Prone: scapula retraction w/ BUE's in sh abduction, bent elbows x 10 Prone on elbows for chest lift and scapula depression followed by disengaging  RUE to increase weight over LUE.   Placing [place] pegs in pegboard Lt hand then removing with tweezers for coordination  UBE x 8 min, level 2 for normal reciprocal movement pattern and UB conditioning       HOME EXERCISE PROGRAMS: Cane exercises supine, putty HEP 02/06/22 Coordination HEP 02/10/22 Prone sh HEP 02/24/22   GOALS:   SHORT TERM GOALS: Target date: 03/05/2022  Pt will be independent with HEP targeting Lt shoulder pain and ROM  Baseline: Goal status: MET  2.  Pt to cut food mod I w/ A/E prn Baseline:  Goal status: MET in clinic using Lt hand as stabilizer w/ regular utensils  3.  Pt to simulate tub transfers safely w/ DME  prn and close sup Baseline:  Goal status: MET  4.  Pt to improve functional use of LUE by performing 28 or greater on Box & Blocks Baseline: 19 Goal status: MET (02/24/22: 41)  5.  Pt to improve grip strength Lt hand to 35 lbs or greater for opening jars/containers Baseline: 24.4 lbs (Rt = 66 lbs) Goal status: MET (02/24/22: 43.2 LBS)    LONG TERM GOALS: Target date: 04/16/2022  Independent with HEP for grip strength and coordination LT hand Baseline:  Goal status: MET  2.  Pt to return to cooking simple meal at distant supervision  Baseline: dependent  Goal status: ONGOING w/ direct supervision  3.  Pt able to pick up and care for puppy using BUE's safely Baseline: unable Goal status: ONGOING  4.  Pt to improve coordination as evidenced by performing 9 hole peg test in 28 sec or less Baseline: 34 sec (Rt = 18 sec) Goal status: MET (02/24/22: 25.47 sec)   5.  Pt to improve LUE shoulder ROM by reaching into high shelf for light weight object w/ pain less than or equal to 3/10 Baseline: approx 90* w/ pain 6/10 Goal status: MET  6.  Pt to demo 90% shoulder movement for all functional tasks w/ pain 3/10 or under Baseline:  Goal status: MET  ASSESSMENT:  CLINICAL IMPRESSION: Pt demonstrates excellent overall progress with LUE ROM and functional use.Pt. still has pain at neck along upper traps on Lt side but no pain in Lt shoulder. Pt has met most LTG's at this time  PERFORMANCE DEFICITS in functional skills including ADLs, IADLs, coordination, sensation, ROM, strength, pain, FMC, mobility, body mechanics, decreased knowledge of use of DME, and UE functional use  IMPAIRMENTS are limiting patient from ADLs, IADLs, rest and sleep, work, leisure, and social participation.   COMORBIDITIES may have co-morbidities  that affects occupational performance. Patient will benefit from skilled OT to address above impairments and improve overall function.  MODIFICATION OR ASSISTANCE TO  COMPLETE EVALUATION: No modification of tasks or assist necessary to complete an evaluation.  OT OCCUPATIONAL PROFILE AND HISTORY: Detailed assessment: Review of records and additional review of physical, cognitive, psychosocial history related to current functional performance.  CLINICAL DECISION MAKING: LOW - limited treatment options, no task modification necessary  REHAB POTENTIAL: Good  EVALUATION COMPLEXITY: Low    PLAN: OT FREQUENCY: 2x/week  OT DURATION: 10 weeks, plus eval  PLANNED INTERVENTIONS: self care/ADL training, therapeutic exercise, therapeutic activity, neuromuscular re-education, manual therapy, passive range of motion, functional mobility training, aquatic therapy, moist heat, cryotherapy, patient/family education, coping strategies training, and DME and/or AE instructions  RECOMMENDED OTHER SERVICES: NONE  CONSULTED AND AGREED WITH PLAN OF CARE: Patient  PLAN FOR NEXT SESSION: Pt to bring in liquid  maalox or mylanta to try under kinesiotape to relax upper traps, anticipate d/c in next 2 sessions   Redmond Baseman, OTR/L 03/10/22 10:12 AM Phone [phone] FAX [phone]

## 2022-03-10 NOTE — Therapy (Signed)
OUTPATIENT PHYSICAL THERAPY TREATMENT NOTE- 10TH VISIT PROGRESS NOTE   Patient Name: LAURIEL HELIN MRN: [id number] DOB:1966/06/11, 56 y.o., female Today's Date: 03/10/2022  PCP: Monico Blitz, MD REFERRING PROVIDER: Cherene Altes, MD   Physical Therapy Progress Note   Dates of Reporting Period:02/05/22 - 03/10/22  See Note below for Objective Data and Assessment of Progress/Goals.  Thank you for the referral of this patient. Mickie Bail Trysten Berti, PT, DPT   END OF SESSION:   PT End of Session - 03/10/22 0850     Visit Number 10    Number of Visits 17   Plus eval   Date for PT Re-Evaluation 04/30/22    Authorization Type [employer]    PT Start Time [phone]    PT Stop Time 0930    PT Time Calculation (min) 40 min    Equipment Utilized During Treatment --    Activity Tolerance Patient tolerated treatment well   Dizziness   Behavior During Therapy WFL for tasks assessed/performed                 Past Medical History:  Diagnosis Date   Anemia    Graves disease    Hypothyroidism    Past Surgical History:  Procedure Laterality Date   CESAREAN SECTION     COLONOSCOPY N/A 11/10/2018   Procedure: COLONOSCOPY;  Surgeon: Danie Binder, MD;  [facility];  Service: Endoscopy;  Laterality: N/A;  1:30   Cyst removed from neck     IR ANGIO INTRA EXTRACRAN SEL COM CAROTID INNOMINATE BILAT MOD SED  01/28/2022   IR ANGIO VERTEBRAL SEL SUBCLAVIAN INNOMINATE UNI L MOD SED  01/28/2022   IR ANGIO VERTEBRAL SEL VERTEBRAL UNI R MOD SED  01/28/2022   IR US GUIDE VASC ACCESS RIGHT  01/28/2022   TOTAL ABDOMINAL HYSTERECTOMY  04/07/2019   Dr. 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 hypothyroidism, GERD, iron deficiency   PRECAUTIONS: Fall   Vitals:   03/10/22 0901  BP: 116/76  Pulse: (!) 55       SUBJECTIVE: Reports she swapped mattresses w/her husband and she slept better last night. Neck pain is reduced today.   PAIN:  Are you having pain? Yes NPRS scale: 3/10 Pain location: cervical spine, sharp pain at times in her mid/thoracic spine at times Pain orientation: Left  PAIN TYPE: acute Pain description: intermittent and sharp  Aggravating factors: certain movements Relieving factors: Tylenol, heat         TODAY'S TREATMENT: Ther Act  [facility] PT Assessment - 03/10/22 0903       Balance   Balance Assessed Yes      Functional Gait  Assessment  Gait assessed  Yes    Gait Level Surface Walks 20 ft in less than 7 sec but greater than 5.5 sec, uses assistive device, slower speed, mild gait deviations, or deviates 6-10 in outside of the 12 in walkway width.   6.16s   Change in Gait Speed Able to smoothly change walking speed without loss of balance or gait deviation. Deviate no more than 6 in outside of the 12 in walkway width.    Gait with Horizontal Head Turns Performs head turns smoothly with no change in gait. Deviates no more than 6 in outside 12 in walkway width    Gait with Vertical Head Turns Performs head turns with no change in gait. Deviates no more than 6 in outside 12 in walkway width.    Gait and Pivot Turn Pivot turns safely  within 3 sec and stops quickly with no loss of balance.    Step Over Obstacle Is able to step over one shoe box (4.5 in total height) but must slow down and adjust steps to clear box safely. May require verbal cueing.    Gait with Narrow Base of Support Is able to ambulate for 10 steps heel to toe with no staggering.    Gait with Eyes Closed Walks 20 ft, uses assistive device, slower speed, mild gait deviations, deviates 6-10 in outside 12 in walkway width. Ambulates 20 ft in less than 9 sec but greater than 7 sec.   8.65s, lateral deviation to R side   Ambulating Backwards Walks 20 ft, no assistive devices, good speed, no evidence for imbalance, normal gait    Steps Alternating feet, must use rail.    Total Score 25    FGA comment: Low fall risk             NMR  Performed parallel to counter top for improved obstacle navigation, single leg stability, anticipatory stepping and high amplitude movement: -walking over stepping stones placed randomly on ground, x4 w/CGA. Noted extremely cautious behavior and valsalva throughout, min cues to maintain breathing. Mild dizziness reported following activity  - obstacle course consisting of hopscotch (w/colored dots), stepping stones, 6" hurdles and dynadiscs, CGA-min A throughout. Noted greatest difficulty w/hopscotch and dynadiscs. Mild dizziness reported  -Modified plantigrade stance jacks w/UE support on counter, x8. Pt reported increased in nerve pain in LLE and moderate dizziness. Regressed to modified plantigrade mountain climbers, x8 per side, and pt able to tolerate much better. Mod cues throughout session to avoid valsalva.     PATIENT EDUCATION: Education details: FGA assessment, POC moving forward, integrating modified plank jacks/mtn climbers into HEP  Person educated: Patient Education method: Explanation Education comprehension: verbalized understanding and needs further education     HOME EXERCISE PROGRAM: Access Code:  [email or web address] URL: [email or web address] Date: 02/19/2022 Prepared by: Willow Ora  Exercises - Standing Near Stance in [place] with Eyes Closed  - 1 x daily - 5 x weekly - 1 sets - 3 reps - 30 seconds hold - Sit to stand with right foot elevated on small step/book   - 1 x daily - 7 x weekly - 3 sets - 10 reps - Side Stepping with Resistance at Thighs and Counter Support  - 1 x daily - 7 x weekly - 3 sets - 10 reps - Forward Backward Monster Walk with Band at Thighs and Counter Support  - 1 x daily - 7 x weekly - 3 sets - 10 reps - Standing Hip Abduction with Counter Support  - 1  x daily - 7 x weekly - 3 sets - 10 reps - Standing Hip Extension with Counter Support  - 1 x daily - 7 x weekly - 3 sets - 10 reps  Added to HEP on 02/19/22: - Supine Posterior Pelvic Tilt  - 1 x daily - 5 x weekly - 1 sets - 10 reps - 5 seconds hold - Supine Double Knee to Chest  - 1 x daily - 5 x weekly - 1 sets - 3 reps - 30 econds hold - Supine Lower Trunk Rotation  - 1 x daily - 5 x weekly - 1 sets - 3 reps - 30 seconds hold - Seated Hamstring Stretch  - 1 x daily - 5 x weekly - 1 sets - 3 reps - 30 seconds hold      GOALS: Goals reviewed with patient? Yes   SHORT TERM GOALS: Target date: 03/05/2022   Pt will be independent with independent HEP for improved strength, balance, transfers and gait.   Baseline: Goal status: MET   2.  Pt will improve 5 x STS to less than or equal to 21 seconds without BUE support and minimal dizziness to demonstrate improved functional strength and transfer efficiency.   Baseline: 25.91s without BUE support, max dizziness; 13.06s without BUE support, mild dizziness that dissipated quickly  Goal status: MET   3.  Pt will improve Berg score to 47/56 for decreased fall risk   Baseline: 03/05/22- 56/56 Goal status: MET   4.  Pt will improve gait velocity to at least 2.4 ft/s with LRAD for improved gait efficiency and functional mobility   Baseline: 2.06 ft/s without AD;  3.46 ft/s without AD on 5/15  Goal status: MET        LONG TERM GOALS: Target date: 04/02/2022   Pt will be independent with final HEP for improved strength, balance, transfers and gait.   Baseline:  Goal status: INITIAL   2.  Pt will ambulate greater than or equal to 1,000 feet on 6MWT with LRAD mod I for improved cardiovascular endurance and BLE strength.  Baseline: 745' w/SBQC and CGA Goal status: INITIAL   3.  Pt will improve Berg score to 51/56 for decreased fall risk   Baseline: 03/05/22: 56/56 Goal status: MET   4.  Pt will improve 5 x STS to less than or equal to 10 seconds without BUE support without dizziness to demonstrate improved functional strength and transfer efficiency.    Baseline: 25.91s without BUE support; 13.06s without UE support and mild dizziness on 5/15  Goal status: REVISED    5.  Pt will improve gait velocity to at least 3.8 ft/s with LRAD for improved gait efficiency and performance at community ambulator level   Baseline: 2.04 ft/s without AD; 3.46 ft/s without AD on 5/15  Goal status: REVISED   6. Pt will improve FGA to 28/30 for decreased fall risk   Baseline: 25/30 on 5/22   Goal status: NEW      ASSESSMENT:   CLINICAL IMPRESSION: Emphasis of skilled PT session on further dynamic balance assessment, obstacle navigation and high amplitude movement. Pt achieved a 25/30 on FGA and new LTG written. Pt demonstrated the most difficulty w/stepping over an obstacle and gait w/EC. Pt demonstrates fear-avoidance behavior when approaching obstacles or performing high-amplitude tasks (jumping) despite maintaining good balance and required min-mod verbal cues to breathe as pt frequently held breath. Continue POC.      OBJECTIVE IMPAIRMENTS Abnormal gait, decreased activity tolerance,  decreased balance, decreased coordination, decreased endurance, decreased mobility, difficulty walking, decreased strength, impaired sensation, impaired UE functional use, and  pain.    ACTIVITY LIMITATIONS cleaning, community activity, driving, meal prep, occupation, and medication management.    PERSONAL FACTORS Fitness and 1 comorbidity: dizziness  are also affecting patient's functional outcome.      REHAB POTENTIAL: Good   CLINICAL DECISION MAKING: Stable/uncomplicated   EVALUATION COMPLEXITY: Low   PLAN: PT FREQUENCY: 2x/week   PT DURATION: 12 weeks   PLANNED INTERVENTIONS: Therapeutic exercises, Therapeutic activity, Neuromuscular re-education, Balance training, Gait training, Patient/Family education, Stair training, and DME instructions   PLAN FOR NEXT SESSION:  Monitor BP. High amplitude tasks, obstacle navigation. Dual task, head turns/nods during dynamic activity. treadmill training (progress speed >1.0 mph and incline) and elliptical. Continue to address gait with emphasis on increased stance on left LE without cane, balance training on compliant surfaces, LE strengthening- step ups/down, resisted movements, leg press. Be mindful not to exacerbate low back pain with activities performed. Bosu black side up for midline orientation tasks, resisted step ups, rockerboard rebounder throws, single leg march ups to blue side of bosu    Lateef Juncaj E Ruthanna Macchia, PT, DPT 03/10/22, 10:39 AM

## 2022-03-12 ENCOUNTER — Ambulatory Visit: Payer: 59 | Admitting: Physical Therapy

## 2022-03-12 ENCOUNTER — Encounter: Payer: Self-pay | Admitting: Occupational Therapy

## 2022-03-12 ENCOUNTER — Encounter: Payer: Self-pay | Admitting: Physical Therapy

## 2022-03-12 ENCOUNTER — Ambulatory Visit: Payer: 59 | Admitting: Occupational Therapy

## 2022-03-12 DIAGNOSIS — R2689 Other abnormalities of gait and mobility: Secondary | ICD-10-CM

## 2022-03-12 DIAGNOSIS — M25512 Pain in left shoulder: Secondary | ICD-10-CM | POA: Diagnosis not present

## 2022-03-12 DIAGNOSIS — M6281 Muscle weakness (generalized): Secondary | ICD-10-CM

## 2022-03-12 DIAGNOSIS — R2681 Unsteadiness on feet: Secondary | ICD-10-CM

## 2022-03-12 DIAGNOSIS — R278 Other lack of coordination: Secondary | ICD-10-CM

## 2022-03-12 DIAGNOSIS — M25612 Stiffness of left shoulder, not elsewhere classified: Secondary | ICD-10-CM

## 2022-03-12 NOTE — Therapy (Signed)
OUTPATIENT PHYSICAL THERAPY TREATMENT NOTE- 10TH VISIT PROGRESS NOTE   Patient Name: Kristy Abbott MRN: [id number] DOB:03/18/1966, 56 y.o., female Today's Date: 03/12/2022  PCP: Monico Blitz, MD REFERRING PROVIDER: Cherene Altes, MD   Physical Therapy Progress Note   Dates of Reporting Period:02/05/22 - 03/10/22  See Note below for Objective Data and Assessment of Progress/Goals.  Thank you for the referral of this patient. Mickie Bail Plaster, PT, DPT   END OF SESSION:   PT End of Session - 03/12/22 0935     Visit Number 11    Number of Visits 17   Plus eval   Date for PT Re-Evaluation 04/30/22    Authorization Type [employer]    PT Start Time [phone]    PT Stop Time 1015    PT Time Calculation (min) 42 min    Equipment Utilized During Treatment Gait belt    Activity Tolerance Patient tolerated treatment well   Dizziness   Behavior During Therapy WFL for tasks assessed/performed                 Past Medical History:  Diagnosis Date   Anemia    Graves disease    Hypothyroidism    Past Surgical History:  Procedure Laterality Date   CESAREAN SECTION     COLONOSCOPY N/A 11/10/2018   Procedure: COLONOSCOPY;  Surgeon: Danie Binder, MD;  [facility];  Service: Endoscopy;  Laterality: N/A;  1:30   Cyst removed from neck     IR ANGIO INTRA EXTRACRAN SEL COM CAROTID INNOMINATE BILAT MOD SED  01/28/2022   IR ANGIO VERTEBRAL SEL SUBCLAVIAN INNOMINATE UNI L MOD SED  01/28/2022   IR ANGIO VERTEBRAL SEL VERTEBRAL UNI R MOD SED  01/28/2022   IR US GUIDE VASC ACCESS RIGHT  01/28/2022   TOTAL ABDOMINAL HYSTERECTOMY  04/07/2019   Dr. 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 hypothyroidism, GERD, iron deficiency   PRECAUTIONS: Fall   There were no vitals filed for this visit.      SUBJECTIVE: No new complaints. No falls.   PAIN:  Are you having pain? Yes NPRS scale: 3/10 Pain location: cervical spine, sharp pain at times in her mid/thoracic spine at times Pain orientation: Left  PAIN TYPE: acute Pain description: constant, dull, and aching  Aggravating factors: certain movements Relieving factors: Tylenol, heat         TODAY'S TREATMENT:  03/12/2022 STRENGTHENING  Elliptical level 1.0 x 1.5 minutes each forward/backward with bil UE support, min guard assist to get on/off for safety.  GAIT On treadmill Forward direction with bil UE's: 1.3 mph level x 2 minutes, 1% incline x  1 minutes, 2% incline x 1 minute, 1% incline x 1 minute, then level x 1 more minute= total of 6 minutes Backwards with tread in reverse with bil UE support: 1.3 mph level x minute, 1% for downhill x 1 minutes, 2% for increased down hill x 1 minute, back to 1% x 1 minute, level x 1 more minute- total 5 minutes  BALANCE/NMR: BOSU blue side up: alternating step ups with contralateral march for 10 reps each side with light UE support on bars Inverted BOSU: rocking anterior/posterior direction, then laterally for 10 reps each way, then mini squats x 10 reps with bil UE support on bars. Cues on posture and weight shifting to assist  with balance.     PATIENT EDUCATION: Education details: continue with HEP Person educated: Patient Education method: Explanation Education comprehension: verbalized understanding and needs further education     HOME EXERCISE PROGRAM: Access Code: [email or web address] URL: [email or web address] Date: 02/19/2022 Prepared by: Willow Ora  Exercises - Standing Near Stance in [place] with Eyes Closed  - 1 x daily - 5 x weekly - 1 sets - 3 reps - 30 seconds hold - Sit to stand with right foot elevated on small step/book   - 1 x daily - 7 x weekly - 3 sets - 10 reps - Side Stepping with Resistance at Thighs and Counter Support  - 1 x daily - 7 x weekly - 3 sets - 10 reps - Forward Backward Monster Walk with Band at [employer] and Counter Support  - 1 x daily - 7 x weekly - 3 sets - 10 reps - Standing Hip Abduction with Counter Support  - 1 x daily - 7 x weekly - 3 sets - 10 reps - Standing Hip Extension with Counter Support  - 1 x daily - 7 x weekly - 3 sets - 10 reps  Added to HEP on 02/19/22: - Supine Posterior Pelvic Tilt  - 1 x daily - 5 x weekly - 1 sets - 10 reps - 5 seconds hold - Supine Double Knee to Chest  - 1 x daily - 5 x weekly - 1 sets - 3 reps - 30 econds hold - Supine Lower Trunk Rotation  - 1 x daily - 5 x weekly - 1 sets - 3 reps - 30 seconds hold - Seated Hamstring Stretch  - 1 x daily - 5 x weekly - 1 sets - 3 reps - 30 seconds hold      GOALS: Goals reviewed with patient? Yes   SHORT TERM GOALS: Target date: 03/05/2022   Pt will be independent with independent HEP for improved strength, balance, transfers and gait.   Baseline: Goal status: MET   2.  Pt will improve 5 x STS to less than or equal to 21 seconds without BUE support and minimal dizziness to demonstrate improved functional strength and transfer efficiency.   Baseline: 25.91s without BUE support, max dizziness; 13.06s without BUE support, mild dizziness that dissipated quickly  Goal status: MET   3.  Pt  will improve Berg score to 47/56 for decreased fall risk   Baseline: 03/05/22- 56/56 Goal status: MET   4.  Pt will improve gait velocity to at least 2.4 ft/s with LRAD for improved gait efficiency and functional mobility   Baseline: 2.06 ft/s without AD; 3.46 ft/s without AD on 5/15  Goal status: MET        LONG TERM GOALS: Target date: 04/02/2022   Pt will be independent with final  HEP for improved strength, balance, transfers and gait.   Baseline:  Goal status: INITIAL   2.  Pt will ambulate greater than or equal to 1,000 feet on 6MWT with LRAD mod I for improved cardiovascular endurance and BLE strength.  Baseline: 745' w/SBQC and CGA Goal status: INITIAL   3.  Pt will improve Berg score to 51/56 for decreased fall risk   Baseline: 03/05/22: 56/56 Goal status: MET   4.  Pt will improve 5 x STS to less than or equal to 10 seconds without BUE support without dizziness to demonstrate improved functional strength and transfer efficiency.    Baseline: 25.91s without BUE support; 13.06s without UE support and mild dizziness on 5/15  Goal status: REVISED    5.  Pt will improve gait velocity to at least 3.8 ft/s with LRAD for improved gait efficiency and performance at community ambulator level   Baseline: 2.04 ft/s without AD; 3.46 ft/s without AD on 5/15  Goal status: REVISED   6. Pt will improve FGA to 28/30 for decreased fall risk   Baseline: 25/30 on 5/22   Goal status: NEW      ASSESSMENT:   CLINICAL IMPRESSION:  Today's skilled session continued to focus on strengthening, activity tolerance and balance training on compliant surfaces with brief rest breaks needed at times. No other issues noted or reported in session. The pt appears to be making steady progress toward goals and should benefit from continued PT to progress toward unmet goals.      OBJECTIVE IMPAIRMENTS Abnormal gait, decreased activity tolerance, decreased balance, decreased coordination, decreased endurance,  decreased mobility, difficulty walking, decreased strength, impaired sensation, impaired UE functional use, and pain.    ACTIVITY LIMITATIONS cleaning, community activity, driving, meal prep, occupation, and medication management.    PERSONAL FACTORS Fitness and 1 comorbidity: dizziness  are also affecting patient's functional outcome.      REHAB POTENTIAL: Good   CLINICAL DECISION MAKING: Stable/uncomplicated   EVALUATION COMPLEXITY: Low   PLAN: PT FREQUENCY: 2x/week   PT DURATION: 12 weeks   PLANNED INTERVENTIONS: Therapeutic exercises, Therapeutic activity, Neuromuscular re-education, Balance training, Gait training, Patient/Family education, Stair training, and DME instructions   PLAN FOR NEXT SESSION:  Monitor BP. High amplitude tasks,- ? Mini tramp. Continue with up/down hill on Treadmill for activity tolerance, Elliptical for strengthening/activity tolerance, high level balance on compliant surfaces- stepping stones, hurdles on mats, unlevel mats. LE strengthening    Willow Ora, PTA, [facility] [address] [place], [place] [address] [phone] 03/12/22, 10:51 AM

## 2022-03-12 NOTE — Therapy (Signed)
OUTPATIENT OCCUPATIONAL THERAPY TREATMENT NOTE   Patient Name: Kristy Abbott MRN: [id number] DOB:19-Mar-1966, 56 y.o., female Today's Date: 03/12/2022  PCP: Monico Blitz, MD REFERRING PROVIDER: Monico Blitz, MD      OT End of Session - 03/12/22 0844     Visit Number 11    Number of Visits 21    Date for OT Re-Evaluation 04/16/22    Authorization Type [employer] - VL [id number]    Authorization - Visit Number 11    Authorization - Number of Visits 30    OT Start Time [phone]    OT Stop Time 0928    OT Time Calculation (min) 46 min    Activity Tolerance Patient tolerated treatment well    Behavior During Therapy [facility] for tasks assessed/performed                  Past Medical History:  Diagnosis Date   Anemia    Graves disease    Hypothyroidism    Past Surgical History:  Procedure Laterality Date   CESAREAN SECTION     COLONOSCOPY N/A 11/10/2018   Procedure: COLONOSCOPY;  Surgeon: Danie Binder, MD;  [facility];  Service: Endoscopy;  Laterality: N/A;  1:30   Cyst removed from neck     IR ANGIO INTRA EXTRACRAN SEL COM CAROTID INNOMINATE BILAT MOD SED  01/28/2022   IR ANGIO VERTEBRAL SEL SUBCLAVIAN INNOMINATE UNI L MOD SED  01/28/2022   IR ANGIO VERTEBRAL SEL VERTEBRAL UNI R MOD SED  01/28/2022   IR US GUIDE VASC ACCESS RIGHT  01/28/2022   TOTAL ABDOMINAL HYSTERECTOMY  04/07/2019   Dr. 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 PMH: Hyperlipidemia, GERD (gastroesophageal reflux disease)  PRECAUTIONS: No driving  SUBJECTIVE: Doing good, except soreness in neck. Able to pick up puppy more  PAIN:  Are you having pain? Yes: NPRS scale: 3/10 Pain location: neck  Pain description: dull, sore, aching Aggravating factors: movement Relieving factors: relaxing, heat, tylenol     OBJECTIVE: (Rt handed)  TODAY'S TREATMENT:  Pt brought in liquid Mylanta to apply prior to kinesiotape today for skin sensitivity. Applied first, let dry, and then applied kinesiotape to relax upper traps Lt side.   Prone: scapula retraction w/ arms in abduction, elbows bent x 10 w/ cues to prevent sh hiking. Prone on elbows for chest lift and scapula depression x 5 reps holding 5 sec.  Quadraped: cat/cow stretch x 5 followed by A/P wt shifts w/ cues to maintain scapula stability  Standing: AA/ROM BUE's in high level sh flexion along wall w/ ball, followed by high level lifts off ball for scapula depression.  UBE x 8 min, level 3 resistance for UB conditioning  Pt placing small pegs in pegboard Lt hand, manipulating for fingertip to/from palm translation, up to 5 pegs one at a time.      HOME EXERCISE PROGRAMS: Cane exercises supine, putty HEP 02/06/22 Coordination HEP 02/10/22 Prone sh HEP 02/24/22   GOALS:   SHORT TERM GOALS: Target date: 03/05/2022  Pt will be independent with HEP targeting Lt shoulder pain and ROM  Baseline: Goal status: MET  2.  Pt to cut food mod I w/ A/E prn Baseline:  Goal status: MET in clinic using Lt hand as stabilizer w/ regular utensils  3.  Pt to simulate tub transfers safely w/  DME prn and close sup Baseline:  Goal status: MET  4.  Pt to improve functional use of LUE by performing 28 or greater on Box & Blocks Baseline: 19 Goal status: MET (02/24/22: 41)  5.  Pt to improve grip strength Lt hand to 35 lbs or greater for opening jars/containers Baseline: 24.4 lbs (Rt = 66 lbs) Goal status: MET (02/24/22: 43.2 LBS)    LONG TERM GOALS: Target date: 04/16/2022  Independent with HEP for grip strength and coordination LT hand Baseline:  Goal status: MET  2.  Pt to return to cooking simple meal at distant supervision  Baseline: dependent  Goal status: MET  3.  Pt able to pick up and care for puppy using BUE's safely Baseline: unable Goal status: ONGOING  4.  Pt to improve coordination as evidenced by performing 9 hole peg test in 28 sec or less Baseline: 34 sec (Rt = 18 sec) Goal status: MET (02/24/22: 25.47 sec)   5.  Pt to improve LUE shoulder ROM by reaching into high shelf for light weight object w/ pain less than or equal to 3/10 Baseline: approx 90* w/ pain 6/10 Goal status: MET  6.  Pt to demo 90% shoulder movement for all functional tasks w/ pain 3/10 or under Baseline:  Goal status: MET  ASSESSMENT:  CLINICAL IMPRESSION: Pt demonstrates excellent overall progress with LUE ROM and functional use.Pt. still has pain at neck along upper traps on Lt side but no pain in Lt shoulder. Pt has met most LTG's at this time  PERFORMANCE DEFICITS in functional skills including ADLs, IADLs, coordination, sensation, ROM, strength, pain, FMC, mobility, body mechanics, decreased knowledge of use of DME, and UE functional use  IMPAIRMENTS are limiting patient from ADLs, IADLs, rest and sleep, work, leisure, and social participation.   COMORBIDITIES may have co-morbidities  that affects occupational performance. Patient will benefit from skilled OT to address above impairments and improve overall function.  MODIFICATION OR ASSISTANCE TO COMPLETE EVALUATION: No  modification of tasks or assist necessary to complete an evaluation.  OT OCCUPATIONAL PROFILE AND HISTORY: Detailed assessment: Review of records and additional review of physical, cognitive, psychosocial history related to current functional performance.  CLINICAL DECISION MAKING: LOW - limited treatment options, no task modification necessary  REHAB POTENTIAL: Good  EVALUATION COMPLEXITY: Low    PLAN: OT FREQUENCY: 2x/week  OT DURATION: 10 weeks, plus eval  PLANNED INTERVENTIONS: self care/ADL training, therapeutic exercise, therapeutic activity, neuromuscular re-education, manual therapy, passive range of motion, functional mobility training, aquatic therapy, moist heat, cryotherapy, patient/family education, coping strategies training, and DME and/or AE instructions  RECOMMENDED OTHER SERVICES: NONE  CONSULTED AND AGREED WITH PLAN OF CARE: Patient  PLAN FOR NEXT SESSION: Assess how taping went with skin barrier applied - if it works  then review on how to do at home w/ pt/husband, D/C next session   Redmond Baseman, OTR/L 03/12/22 8:44 AM Phone [phone] FAX [phone]

## 2022-03-18 ENCOUNTER — Ambulatory Visit: Payer: 59

## 2022-03-18 ENCOUNTER — Ambulatory Visit: Payer: 59 | Admitting: Occupational Therapy

## 2022-03-18 ENCOUNTER — Encounter: Payer: Self-pay | Admitting: Occupational Therapy

## 2022-03-18 VITALS — BP 103/75 | HR 61

## 2022-03-18 DIAGNOSIS — R278 Other lack of coordination: Secondary | ICD-10-CM

## 2022-03-18 DIAGNOSIS — M25512 Pain in left shoulder: Secondary | ICD-10-CM | POA: Diagnosis not present

## 2022-03-18 DIAGNOSIS — M6281 Muscle weakness (generalized): Secondary | ICD-10-CM

## 2022-03-18 DIAGNOSIS — R2681 Unsteadiness on feet: Secondary | ICD-10-CM

## 2022-03-18 DIAGNOSIS — R2689 Other abnormalities of gait and mobility: Secondary | ICD-10-CM

## 2022-03-18 NOTE — Patient Instructions (Addendum)
Lateral Flexion    With head in comfortable, centered position and chin slightly tucked, gently bring right ear toward right shoulder, gently hold w/ other hand. Hold _10___ seconds. Repeat with left side. Repeat __5__ times. Do __1-2__ sessions per day.   Chair Push-Up    Lift buttocks off seat of chair by pushing down with arms. Repeat _5-10___ times. Do _1-2___ sessions per day. STOP if this exercise becomes painful

## 2022-03-18 NOTE — Therapy (Signed)
OUTPATIENT PHYSICAL THERAPY TREATMENT NOTE   Patient Name: Kristy Abbott MRN: [id number] DOB:11/15/1965, 56 y.o., female Today's Date: 03/18/2022  PCP: Monico Blitz, MD REFERRING PROVIDER: Cherene Altes, MD   Physical Therapy Progress Note    END OF SESSION:   PT End of Session - 03/18/22 0804     Visit Number 12    Number of Visits 17   Plus eval   Date for PT Re-Evaluation 04/30/22    Authorization Type [employer]    PT Start Time 0802    PT Stop Time [id number]    PT Time Calculation (min) 41 min    Equipment Utilized During Treatment Gait belt    Activity Tolerance Patient tolerated treatment well   Dizziness   Behavior During Therapy WFL for tasks assessed/performed                 Past Medical History:  Diagnosis Date   Anemia    Graves disease    Hypothyroidism    Past Surgical History:  Procedure Laterality Date   CESAREAN SECTION     COLONOSCOPY N/A 11/10/2018   Procedure: COLONOSCOPY;  Surgeon: Danie Binder, MD;  [facility];  Service: Endoscopy;  Laterality: N/A;  1:30   Cyst removed from neck     IR ANGIO INTRA EXTRACRAN SEL COM CAROTID INNOMINATE BILAT MOD SED  01/28/2022   IR ANGIO VERTEBRAL SEL SUBCLAVIAN INNOMINATE UNI L MOD SED  01/28/2022   IR ANGIO VERTEBRAL SEL VERTEBRAL UNI R MOD SED  01/28/2022   IR US GUIDE VASC ACCESS RIGHT  01/28/2022   TOTAL ABDOMINAL HYSTERECTOMY  04/07/2019   Dr. 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 hypothyroidism, GERD, iron deficiency   PRECAUTIONS: Fall   Vitals:   03/18/22 0807  BP: 103/75  Pulse: 61      SUBJECTIVE: Patient reports some pain in the neck, mild. No falls.   PAIN:  Are you having pain? Yes NPRS scale: 4/10 Pain location: cervical spine, sharp pain at times in her mid/thoracic spine at times Pain orientation: Left  PAIN TYPE: acute Pain description: constant, dull, and aching  Aggravating factors: certain movements Relieving factors: Tylenol, heat     TODAY'S TREATMENT: 03/18/2022 GAIT Completed gait training on treadmill in forward direction with bil UE's: 1.5 mph level x 2 minutes, 1% incline x 1 minute, 2% incline x 1 minute, 3% incline x 1 minute, trialed to elevated to 4% incline only able to tolerate approx 20 seconds, then returned to 3% incline x 30 seconds, then 1% x 1 more minute. For a total of 7 minutes, mild SOB noted at end of  completion.   Vitals after completion: Sp02: 100%, HR: 67. BP: 97/72, HR: 65. Seated rest break due to lightheadedness and BP readings. Improvements noted with seated rest break: 110/81  PT educated on beginning walking program for improved endurance, provided handout (see patient instructions). Educated patient to start at 5-7 mins, approx 3x/daily and educating on proper increase in time to promote improved endurance.   BALANCE/NMR: Rockerboard; Completed alternating step up onto Rockerboard positioned A/P with opposite LE toe tap to cone x 10 reps bilat. CGA initially but then to supervision as demo improvements with increased repetition.   Standing on Red Balance: with  bil. stance completed eyes open x 30 seconds, then eyes closed 3 x 30 seconds. Then trialed adding in very small range horizontal head turns while maintaining balance, completed eyes open x 5 reps, then eyes closed x 5 reps. CGA intermittent. Mild lightheadedness with eyes closed required seated rest break.  Intermittent rest breaks required throughout session due to lightheadedness.     PATIENT EDUCATION: Education details: continue with HEP Person educated: Patient Education method: Explanation Education comprehension: verbalized understanding and needs further education     HOME EXERCISE PROGRAM: Access Code: [email or web address] URL: [email or web address] Date: 02/19/2022 Prepared by: Willow Ora  Exercises - Standing Near Stance in [place] with Eyes Closed  - 1 x daily - 5 x weekly - 1 sets - 3 reps - 30 seconds hold - Sit to stand with right foot elevated on small step/book   - 1 x daily - 7 x weekly - 3 sets - 10 reps - Side Stepping with Resistance at Thighs and Counter Support  - 1 x daily - 7 x weekly - 3 sets - 10 reps - Forward Backward Monster Walk with Band at [employer] and Counter Support  - 1 x daily - 7 x weekly - 3 sets - 10 reps - Standing Hip Abduction with Counter Support  - 1 x daily - 7 x weekly - 3 sets - 10 reps - Standing Hip Extension with Counter Support  - 1 x daily - 7 x weekly - 3 sets - 10 reps  Added to HEP on 02/19/22: - Supine Posterior Pelvic Tilt  - 1 x daily - 5 x weekly - 1 sets - 10 reps - 5 seconds hold - Supine Double Knee to Chest  - 1 x daily - 5 x weekly - 1 sets - 3 reps - 30 econds hold - Supine Lower Trunk Rotation  - 1 x daily - 5 x weekly - 1 sets - 3 reps - 30 seconds hold - Seated Hamstring Stretch  - 1 x daily - 5 x weekly - 1 sets - 3 reps - 30 seconds hold      GOALS: Goals reviewed with patient? Yes   SHORT TERM GOALS: Target date: 03/05/2022   Pt will be independent with independent HEP for improved strength, balance, transfers  and gait.   Baseline: Goal status: MET   2.  Pt will improve 5 x STS to less than or equal to 21 seconds without BUE support and minimal dizziness to demonstrate improved functional strength and transfer efficiency.   Baseline: 25.91s without BUE support, max dizziness; 13.06s without BUE support, mild dizziness that dissipated quickly  Goal status: MET   3.  Pt will improve Berg score to 47/56 for decreased fall risk   Baseline: 03/05/22- 56/56 Goal status: MET   4.  Pt will improve gait velocity to at least 2.4 ft/s  with LRAD for improved gait efficiency and functional mobility   Baseline: 2.06 ft/s without AD; 3.46 ft/s without AD on 5/15  Goal status: MET        LONG TERM GOALS: Target date: 04/02/2022   Pt will be independent with final HEP for improved strength, balance, transfers and gait.   Baseline:  Goal status: INITIAL   2.  Pt will ambulate greater than or equal to 1,000 feet on 6MWT with LRAD mod I for improved cardiovascular endurance and BLE strength.  Baseline: 745' w/SBQC and CGA Goal status: INITIAL   3.  Pt will improve Berg score to 51/56 for decreased fall risk   Baseline: 03/05/22: 56/56 Goal status: MET   4.  Pt will improve 5 x STS to less than or equal to 10 seconds without BUE support without dizziness to demonstrate improved functional strength and transfer efficiency.    Baseline: 25.91s without BUE support; 13.06s without UE support and mild dizziness on 5/15  Goal status: REVISED    5.  Pt will improve gait velocity to at least 3.8 ft/s with LRAD for improved gait efficiency and performance at community ambulator level   Baseline: 2.04 ft/s without AD; 3.46 ft/s without AD on 5/15  Goal status: REVISED   6. Pt will improve FGA to 28/30 for decreased fall risk   Baseline: 25/30 on 5/22   Goal status: NEW      ASSESSMENT:   CLINICAL IMPRESSION: Continued gait and endurance training on Treadmill with patient tolerating increased time today and  incline, however increased lightheadedness and BP decline noted required extended rest break. Continued rest of session focused on balance activities. Will continue per POC   OBJECTIVE IMPAIRMENTS Abnormal gait, decreased activity tolerance, decreased balance, decreased coordination, decreased endurance, decreased mobility, difficulty walking, decreased strength, impaired sensation, impaired UE functional use, and pain.    ACTIVITY LIMITATIONS cleaning, community activity, driving, meal prep, occupation, and medication management.    PERSONAL FACTORS Fitness and 1 comorbidity: dizziness  are also affecting patient's functional outcome.      REHAB POTENTIAL: Good   CLINICAL DECISION MAKING: Stable/uncomplicated   EVALUATION COMPLEXITY: Low   PLAN: PT FREQUENCY: 2x/week   PT DURATION: 12 weeks   PLANNED INTERVENTIONS: Therapeutic exercises, Therapeutic activity, Neuromuscular re-education, Balance training, Gait training, Patient/Family education, Stair training, and DME instructions   PLAN FOR NEXT SESSION:  Monitor BP. How was walking program? High amplitude tasks,- ? Mini tramp. Continue with up/down hill on Treadmill for activity tolerance, Elliptical for strengthening/activity tolerance, high level balance on compliant surfaces- stepping stones, hurdles on mats, unlevel mats. LE strengthening    Jones Bales, PT, DPT Outpatient Neuro [facility] [address] [place], [place] [address] [phone] 03/18/22, 9:44 AM

## 2022-03-18 NOTE — Patient Instructions (Signed)
WALKING  Walking is a great form of exercise to increase your strength, endurance and overall fitness.  A walking program can help you start slowly and gradually build endurance as you go.  Everyone's ability is different, so each person's starting point will be different.  You do not have to follow them exactly.  The are just samples. You should simply find out what's right for you and stick to that program.   In the beginning, you'll start off walking 2-3 times a day for short distances.  As you get stronger, you'll be walking further at just 1-2 times per day.  A. You Can Walk For A Certain Length Of Time Each Day    Walk 5-7 minutes 3 times per day.  Increase 1-2 minutes every 2-3 days (3 times per day).  Work up to 25-30 minutes (1-2 times per day).   Example:   Day 1-2 5-7 minutes 3 times per day   Day 7-8 10-15 minutes 2-3 times per day   Day 13-14 20-25 minutes 1-2 times per day

## 2022-03-18 NOTE — Therapy (Signed)
OUTPATIENT OCCUPATIONAL THERAPY TREATMENT NOTE   Patient Name: Kristy Abbott MRN: [id number] DOB:1966-10-09, 56 y.o., female Today's Date: 03/18/2022  PCP: Monico Blitz, MD REFERRING PROVIDER: Cherene Altes, MD      OT End of Session - 03/18/22 0848     Visit Number 12    Number of Visits 21    Date for OT Re-Evaluation 04/16/22    Authorization Type [employer] - VL [id number]    Authorization - Visit Number 12    Authorization - Number of Visits 30    OT Start Time 0846    OT Stop Time 0916    OT Time Calculation (min) 30 min    Activity Tolerance Patient tolerated treatment well    Behavior During Therapy [facility] for tasks assessed/performed                  Past Medical History:  Diagnosis Date   Anemia    Graves disease    Hypothyroidism    Past Surgical History:  Procedure Laterality Date   CESAREAN SECTION     COLONOSCOPY N/A 11/10/2018   Procedure: COLONOSCOPY;  Surgeon: Danie Binder, MD;  [facility];  Service: Endoscopy;  Laterality: N/A;  1:30   Cyst removed from neck     IR ANGIO INTRA EXTRACRAN SEL COM CAROTID INNOMINATE BILAT MOD SED  01/28/2022   IR ANGIO VERTEBRAL SEL SUBCLAVIAN INNOMINATE UNI L MOD SED  01/28/2022   IR ANGIO VERTEBRAL SEL VERTEBRAL UNI R MOD SED  01/28/2022   IR US GUIDE VASC ACCESS RIGHT  01/28/2022   TOTAL ABDOMINAL HYSTERECTOMY  04/07/2019   Dr. 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 PMH: Hyperlipidemia, GERD (gastroesophageal reflux disease)  PRECAUTIONS: No driving  SUBJECTIVE: I had to take the tape off again because it was bothering my skin  PAIN:  Are you having pain? Yes: NPRS scale: 4/10 Pain location: neck  Pain description: dull, sore, aching Aggravating factors: movement Relieving factors: relaxing, heat, tylenol     OBJECTIVE: (Rt handed)  TODAY'S TREATMENT:  Pt reports tape helped but had to take off again due to skin breakdown, therefore will not reapply   Reviewed goals and progress to date - see goal section  Issued neck stretch and chair push ups - see pt instructions for details. Pt instructed to stop chair push ups if painful. UBE x 8 min. Level 3 for UB endurance     HOME EXERCISE PROGRAMS: Cane exercises supine, putty HEP 02/06/22 Coordination HEP 02/10/22 Prone sh HEP 02/24/22 Neck stretch, chair push ups 03/18/22  GOALS:   SHORT TERM GOALS: Target date: 03/05/2022  Pt will be independent with HEP targeting Lt  shoulder pain and ROM  Baseline: Goal status: MET  2.  Pt to cut food mod I w/ A/E prn Baseline:  Goal status: MET in clinic using Lt hand as stabilizer w/ regular utensils  3.  Pt to simulate tub transfers safely w/ DME prn and close sup Baseline:  Goal status: MET  4.  Pt to improve functional use of LUE by performing 28 or greater on Box & Blocks Baseline: 19 Goal status: MET (02/24/22: 41)  5.  Pt to improve grip strength Lt hand to 35 lbs or greater for opening jars/containers Baseline: 24.4 lbs (Rt = 66 lbs) Goal status: MET (02/24/22: 43.2 LBS)    LONG TERM GOALS: Target date: 04/16/2022  Independent with HEP for grip strength and coordination LT  hand Baseline:  Goal status: MET  2.  Pt to return to cooking simple meal at distant supervision  Baseline: dependent  Goal status: MET  3.  Pt able to pick up and care for puppy using BUE's safely Baseline: unable Goal status: MET  4.  Pt to improve coordination as evidenced by performing 9 hole peg test in 28 sec or less Baseline: 34 sec (Rt = 18 sec) Goal status: MET (02/24/22: 25.47 sec)   5.  Pt to improve LUE shoulder ROM by reaching into high shelf for light weight object w/ pain less than or equal to 3/10 Baseline: approx 90* w/ pain 6/10 Goal status: MET  6.  Pt to demo 90% shoulder movement for all functional tasks w/ pain 3/10 or under Baseline:  Goal status: MET  ASSESSMENT:  CLINICAL IMPRESSION: Pt has met all STG's and LTG's. Pt only has residual mild neck pain on Lt side, but no shoulder pain.  PERFORMANCE DEFICITS in functional skills including ADLs, IADLs, coordination, sensation, ROM, strength, pain, FMC, mobility, body mechanics, decreased knowledge of use of DME, and UE functional use  IMPAIRMENTS are limiting patient from ADLs, IADLs, rest and sleep, work, leisure, and social participation.   COMORBIDITIES may have co-morbidities  that affects occupational performance. Patient will benefit from skilled OT to address above impairments and improve overall function.  MODIFICATION OR ASSISTANCE TO COMPLETE EVALUATION: No modification of tasks or assist necessary to complete an evaluation.  OT OCCUPATIONAL PROFILE AND HISTORY: Detailed assessment: Review of records and additional review of physical, cognitive, psychosocial history related to current functional performance.  CLINICAL DECISION MAKING: LOW - limited treatment options, no task modification necessary  REHAB POTENTIAL: Good  EVALUATION COMPLEXITY: Low    PLAN: OT FREQUENCY: 2x/week  OT DURATION: 10 weeks, plus eval  PLANNED INTERVENTIONS: self care/ADL training, therapeutic exercise,  therapeutic activity, neuromuscular re-education, manual therapy, passive range of motion, functional mobility training, aquatic therapy, moist heat, cryotherapy, patient/family education, coping strategies training, and DME and/or AE instructions  RECOMMENDED OTHER SERVICES: NONE  CONSULTED AND AGREED WITH PLAN OF CARE: Patient  PLAN : d/c O.T.    OCCUPATIONAL THERAPY DISCHARGE SUMMARY  Visits from Start of Care: 12  Current functional level related to goals / functional outcomes: See above - pt has met all goals   Remaining deficits: Mild pain along upper traps Lt side   Education / Equipment: HEP's   Patient agrees to discharge. Patient goals were met. Patient is being discharged due to meeting the stated rehab goals.Redmond Baseman, OTR/L 03/18/22 8:48 AM Phone [phone] FAX [phone]

## 2022-03-20 ENCOUNTER — Ambulatory Visit: Payer: 59 | Attending: Internal Medicine | Admitting: Physical Therapy

## 2022-03-20 ENCOUNTER — Encounter: Payer: Self-pay | Admitting: Physical Therapy

## 2022-03-20 ENCOUNTER — Ambulatory Visit: Payer: 59 | Admitting: Occupational Therapy

## 2022-03-20 VITALS — BP 110/66

## 2022-03-20 DIAGNOSIS — M6281 Muscle weakness (generalized): Secondary | ICD-10-CM | POA: Insufficient documentation

## 2022-03-20 DIAGNOSIS — R2681 Unsteadiness on feet: Secondary | ICD-10-CM | POA: Diagnosis present

## 2022-03-20 DIAGNOSIS — R2689 Other abnormalities of gait and mobility: Secondary | ICD-10-CM | POA: Insufficient documentation

## 2022-03-20 NOTE — Therapy (Signed)
OUTPATIENT PHYSICAL THERAPY TREATMENT NOTE   Patient Name: Kristy Abbott MRN: [id number] DOB:1966-05-20, 56 y.o., female Today's Date: 03/20/2022  PCP: Monico Blitz, MD REFERRING PROVIDER: Cherene Altes, MD   Physical Therapy Progress Note    END OF SESSION:   PT End of Session - 03/20/22 0936     Visit Number 13    Number of Visits 17   Plus eval   Date for PT Re-Evaluation 04/30/22    Authorization Type [employer]    PT Start Time [phone]    PT Stop Time 1012    PT Time Calculation (min) 39 min    Equipment Utilized During Treatment Gait belt    Activity Tolerance Patient tolerated treatment well   Dizziness   Behavior During Therapy WFL for tasks assessed/performed                 Past Medical History:  Diagnosis Date   Anemia    Graves disease    Hypothyroidism    Past Surgical History:  Procedure Laterality Date   CESAREAN SECTION     COLONOSCOPY N/A 11/10/2018   Procedure: COLONOSCOPY;  Surgeon: Danie Binder, MD;  [facility];  Service: Endoscopy;  Laterality: N/A;  1:30   Cyst removed from neck     IR ANGIO INTRA EXTRACRAN SEL COM CAROTID INNOMINATE BILAT MOD SED  01/28/2022   IR ANGIO VERTEBRAL SEL SUBCLAVIAN INNOMINATE UNI L MOD SED  01/28/2022   IR ANGIO VERTEBRAL SEL VERTEBRAL UNI R MOD SED  01/28/2022   IR US GUIDE VASC ACCESS RIGHT  01/28/2022   TOTAL ABDOMINAL HYSTERECTOMY  04/07/2019   Dr. 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 hypothyroidism, GERD, iron deficiency   PRECAUTIONS: Fall   Vitals:   03/20/22 0940  BP: 110/66     SUBJECTIVE: Patient reports her left leg feels heavier today. Did do a lot more walking yesterday. No falls.   PAIN:  Are you having pain? Yes NPRS scale: 4/10 Pain location: cervical spine, sharp pain at times in her mid/thoracic spine at times Pain orientation: Left  PAIN TYPE: acute Pain description: constant, dull, and aching  Aggravating factors: certain movements Relieving factors: Tylenol, heat     TODAY'S TREATMENT: 03/20/2022  RAMP:  Level of Assistance:  supervision Assistive device utilized: None Ramp Comments: outdoor   CURB:  Level of Assistance:  supervision Assistive device utilized: None Curb Comments: outdoor curb x 1 with no balance issues noted.   GAIT: Gait pattern: step through pattern and decreased stride length Distance walked: >/= 1400 feet in/outdoors combined, plus around clinic Assistive device utilized: None Level of assistance:  supervision to min guard assist at times Comments: no balance issues noted with gait on paved, grass, gravel and hilly surfaces . Mild lightheadedness with gait outdoors. BP 112/68.   BALANCE/NMR: Side Stepping: on blue foam beam for 4 laps each left<>right with light support on bar, min guard assist, cues on posture/step length.  Tandem Walking: on blue foam beam for 4 laps each forward/backward with light support on bars, cues on posture/step placement.   On airex with fingertip support to no UE support: min guard to min assist for balance. - heel toe raises x 10 reps  with emphasis on tall posture/weight shifting - alternating slow marching for 10 reps each side, holding the knee up for 2-3 seconds each time - EC 30 seconds  3 reps with feet moved closer together     PATIENT EDUCATION: Education details: continue with HEP Person educated: Patient Education method: Explanation Education comprehension: verbalized understanding and needs further education     HOME EXERCISE PROGRAM: Access Code: [email or web address] URL: [email or web address] Date: 02/19/2022 Prepared by: Willow Ora  Exercises - Standing Near Stance in [place] with Eyes Closed  - 1 x daily - 5 x weekly - 1 sets - 3 reps - 30 seconds hold - Sit to stand with right foot elevated on small step/book   - 1 x daily - 7 x weekly - 3 sets - 10 reps - Side Stepping with Resistance at Thighs and Counter Support  - 1 x daily - 7 x weekly - 3 sets - 10 reps - Forward Backward Monster Walk with Band at [employer] and Counter Support  - 1 x daily - 7 x weekly - 3 sets - 10 reps - Standing Hip Abduction with Counter Support  - 1 x daily - 7 x weekly - 3 sets - 10 reps - Standing Hip Extension with Counter Support  - 1 x daily - 7 x weekly - 3 sets - 10 reps  Added to HEP on 02/19/22: - Supine Posterior Pelvic Tilt  - 1 x daily - 5 x weekly - 1 sets - 10 reps - 5 seconds hold - Supine Double Knee to Chest  - 1 x daily - 5 x weekly - 1 sets - 3 reps - 30 econds hold - Supine Lower Trunk Rotation  - 1 x daily - 5 x weekly - 1 sets - 3 reps - 30 seconds hold - Seated Hamstring Stretch  - 1 x daily - 5 x weekly - 1 sets - 3 reps - 30 seconds hold      GOALS: Goals reviewed with patient? Yes   SHORT TERM GOALS: Target date: 03/05/2022   Pt will be independent with independent HEP for improved strength, balance, transfers and gait.   Baseline: Goal status: MET   2.  Pt will improve 5 x STS to less than or equal to 21 seconds without BUE support and minimal dizziness to demonstrate improved functional  strength and transfer efficiency.   Baseline: 25.91s without BUE support, max dizziness; 13.06s without BUE support, mild dizziness that dissipated quickly  Goal status: MET   3.  Pt will improve Berg score to 47/56 for decreased fall risk   Baseline: 03/05/22- 56/56 Goal status: MET   4.  Pt will improve gait velocity to at least 2.4 ft/s with LRAD for improved gait efficiency and functional mobility   Baseline: 2.06 ft/s without AD; 3.46 ft/s without AD on 5/15  Goal status: MET  LONG TERM GOALS: Target date: 04/02/2022   Pt will be independent with final HEP for improved strength, balance, transfers and gait.   Baseline:  Goal status: INITIAL   2.  Pt will ambulate greater than or equal to 1,000 feet on 6MWT with LRAD mod I for improved cardiovascular endurance and BLE strength.  Baseline: 745' w/SBQC and CGA Goal status: INITIAL   3.  Pt will improve Berg score to 51/56 for decreased fall risk   Baseline: 03/05/22: 56/56 Goal status: MET   4.  Pt will improve 5 x STS to less than or equal to 10 seconds without BUE support without dizziness to demonstrate improved functional strength and transfer efficiency.    Baseline: 25.91s without BUE support; 13.06s without UE support and mild dizziness on 5/15  Goal status: REVISED    5.  Pt will improve gait velocity to at least 3.8 ft/s with LRAD for improved gait efficiency and performance at community ambulator level   Baseline: 2.04 ft/s without AD; 3.46 ft/s without AD on 5/15  Goal status: REVISED   6. Pt will improve FGA to 28/30 for decreased fall risk   Baseline: 25/30 on 5/22   Goal status: NEW      ASSESSMENT:   CLINICAL IMPRESSION: Today's skilled session continued to focus on strengthening, gait on varied surfaces and balance training with rest breaks needed due to fatigue and/or lightheadedness. BP WNL's each time checked. No other issues noted or reported. The pt is progressing toward goals and should benefit  from continued PT to progress toward unmet goals.    OBJECTIVE IMPAIRMENTS Abnormal gait, decreased activity tolerance, decreased balance, decreased coordination, decreased endurance, decreased mobility, difficulty walking, decreased strength, impaired sensation, impaired UE functional use, and pain.    ACTIVITY LIMITATIONS cleaning, community activity, driving, meal prep, occupation, and medication management.    PERSONAL FACTORS Fitness and 1 comorbidity: dizziness  are also affecting patient's functional outcome.      REHAB POTENTIAL: Good   CLINICAL DECISION MAKING: Stable/uncomplicated   EVALUATION COMPLEXITY: Low   PLAN: PT FREQUENCY: 2x/week   PT DURATION: 12 weeks   PLANNED INTERVENTIONS: Therapeutic exercises, Therapeutic activity, Neuromuscular re-education, Balance training, Gait training, Patient/Family education, Stair training, and DME instructions   PLAN FOR NEXT SESSION:  Monitor BP. How was walking program? High amplitude tasks,- ? Mini tramp. Continue with up/down hill on Treadmill for activity tolerance, Elliptical for strengthening/activity tolerance, high level balance on compliant surfaces- stepping stones, hurdles on mats, unlevel mats. LE strengthening    Willow Ora, PTA, [place] [address], [place], [place] [address] [phone] 03/20/22, 10:40 AM

## 2022-03-24 ENCOUNTER — Ambulatory Visit: Payer: 59 | Admitting: Physical Therapy

## 2022-03-24 ENCOUNTER — Ambulatory Visit: Payer: 59 | Admitting: Occupational Therapy

## 2022-03-24 VITALS — BP 122/82 | HR 58

## 2022-03-24 DIAGNOSIS — R2689 Other abnormalities of gait and mobility: Secondary | ICD-10-CM

## 2022-03-24 DIAGNOSIS — R2681 Unsteadiness on feet: Secondary | ICD-10-CM

## 2022-03-24 DIAGNOSIS — M6281 Muscle weakness (generalized): Secondary | ICD-10-CM | POA: Diagnosis not present

## 2022-03-24 NOTE — Therapy (Signed)
OUTPATIENT PHYSICAL THERAPY TREATMENT NOTE - DISCHARGE SUMMARY    Patient Name: Kristy Abbott MRN: [id number] DOB:1966/01/10, 56 y.o., female Today's Date: 03/24/2022  PCP: Monico Blitz, MD REFERRING PROVIDER: Cherene Altes, MD    PHYSICAL THERAPY DISCHARGE SUMMARY  Visits from Start of Care: 14  Current functional level related to goals / functional outcomes: See below    Remaining deficits: Dizziness, decreased cardiovascular endurance    Education / Equipment: HEP   Patient agrees to discharge. Patient goals were met. Patient is being discharged due to meeting the stated rehab goals.     END OF SESSION:   PT End of Session - 03/24/22 0849     Visit Number 14    Number of Visits 17   Plus eval   Date for PT Re-Evaluation 04/30/22    Authorization Type [employer]    PT Start Time 0848    PT Stop Time 0916   DC   PT Time Calculation (min) 28 min    Activity Tolerance Patient tolerated treatment well   Dizziness   Behavior During Therapy WFL for tasks assessed/performed                 Past Medical History:  Diagnosis Date   Anemia    Graves disease    Hypothyroidism    Past Surgical History:  Procedure Laterality Date   CESAREAN SECTION     COLONOSCOPY N/A 11/10/2018   Procedure: COLONOSCOPY;  Surgeon: Danie Binder, MD;  [facility];  Service: Endoscopy;  Laterality: N/A;  1:30   Cyst removed from neck     IR ANGIO INTRA EXTRACRAN SEL COM CAROTID INNOMINATE BILAT MOD SED  01/28/2022   IR ANGIO VERTEBRAL SEL SUBCLAVIAN INNOMINATE UNI L MOD SED  01/28/2022   IR ANGIO VERTEBRAL SEL VERTEBRAL UNI R MOD SED  01/28/2022   IR US GUIDE VASC ACCESS RIGHT  01/28/2022   TOTAL ABDOMINAL HYSTERECTOMY  04/07/2019   Dr. 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 hypothyroidism, GERD, iron deficiency   PRECAUTIONS: Fall   Vitals:   03/24/22 0856  BP: 122/82  Pulse: (!) 58      SUBJECTIVE: Patient reports she feels ready to DC from PT today, exercises are going well and she is satisfied with her progress so far. Still not sleeping well.   PAIN:  Are you having pain? Yes NPRS scale: 3/10 Pain location: cervical spine, sharp pain at times in her mid/thoracic spine at times Pain orientation: Left  PAIN TYPE: acute Pain description: constant, dull, and aching  Aggravating factors: certain movements Relieving factors: Tylenol, heat     TODAY'S TREATMENT: NMR  [facility] PT  Assessment - 03/24/22 0859       Transfers   Five time sit to stand comments  10s without UE support, no dizziness      Ambulation/Gait   Gait velocity 32.8' over 8.41s = 3.9 ft/s without AD      6 Minute Walk- Baseline   6 Minute Walk- Baseline no      6 Minute walk- Post Test   Perceived Rate of Exertion ([place]) 12-      6 minute walk test results    Aerobic Endurance Distance Walked 1375    Endurance additional comments no AD      Functional Gait  Assessment   Gait assessed  Yes    Gait Level Surface Walks 20 ft in less than 5.5 sec, no assistive devices, good speed, no evidence for imbalance, normal gait pattern, deviates no more  than 6 in outside of the 12 in walkway width.   4.8s   Change in Gait Speed Able to smoothly change walking speed without loss of balance or gait deviation. Deviate no more than 6 in outside of the 12 in walkway width.    Gait with Horizontal Head Turns Performs head turns smoothly with no change in gait. Deviates no more than 6 in outside 12 in walkway width    Gait with Vertical Head Turns Performs head turns with no change in gait. Deviates no more than 6 in outside 12 in walkway width.    Gait and Pivot Turn Pivot turns safely within 3 sec and stops quickly with no loss of balance.    Step Over Obstacle Is able to step over 2 stacked shoe boxes taped together (9 in total height) without changing gait speed. No evidence of imbalance.    Gait with Narrow Base of Support Is able to ambulate for 10 steps heel to toe with no staggering.    Gait with Eyes Closed Walks 20 ft, uses assistive device, slower speed, mild gait deviations, deviates 6-10 in outside 12 in walkway width. Ambulates 20 ft in less than 9 sec but greater than 7 sec.   9.04s   Ambulating Backwards Walks 20 ft, no assistive devices, good speed, no evidence for imbalance, normal gait    Steps Alternating feet, no rail.    Total Score 29    FGA comment: Low fall risk              PATIENT EDUCATION: Education details: Goal assessment, ordering cervical pillow from [facility] for improved comfort w/sleep, how to obtain new PT referral if mobility needs change, continue HEP/walking program  Person educated: Patient Education method: Explanation Education comprehension: verbalized understanding      HOME EXERCISE PROGRAM: Access Code: [email or web address] URL: [email or web address] Date: 02/19/2022 Prepared by: Willow Ora  Exercises - Standing Near Stance in [place] with Eyes Closed  - 1 x daily - 5 x weekly - 1 sets - 3 reps - 30 seconds hold - Sit to stand with right foot elevated on small step/book   - 1 x daily - 7 x  weekly - 3 sets - 10 reps - Side Stepping with Resistance at Thighs and Counter Support  - 1 x daily - 7 x weekly - 3 sets - 10 reps - Forward Backward Monster Walk with Band at Thighs and Counter Support  - 1 x daily - 7 x weekly - 3 sets - 10 reps - Standing Hip Abduction with Counter Support  - 1 x daily - 7 x weekly -  3 sets - 10 reps - Standing Hip Extension with Counter Support  - 1 x daily - 7 x weekly - 3 sets - 10 reps  Added to HEP on 02/19/22: - Supine Posterior Pelvic Tilt  - 1 x daily - 5 x weekly - 1 sets - 10 reps - 5 seconds hold - Supine Double Knee to Chest  - 1 x daily - 5 x weekly - 1 sets - 3 reps - 30 econds hold - Supine Lower Trunk Rotation  - 1 x daily - 5 x weekly - 1 sets - 3 reps - 30 seconds hold - Seated Hamstring Stretch  - 1 x daily - 5 x weekly - 1 sets - 3 reps - 30 seconds hold      GOALS: Goals reviewed with patient? Yes   SHORT TERM GOALS: Target date: 03/05/2022   Pt will be independent with independent HEP for improved strength, balance, transfers and gait.   Baseline: Goal status: MET   2.  Pt will improve 5 x STS to less than or equal to 21 seconds without BUE support and minimal dizziness to demonstrate improved functional strength and transfer efficiency.   Baseline: 25.91s without BUE support, max dizziness; 13.06s without BUE support, mild dizziness that dissipated quickly  Goal status: MET   3.  Pt will improve Berg score to 47/56 for decreased fall risk   Baseline: 03/05/22- 56/56 Goal status: MET   4.  Pt will improve gait velocity to at least 2.4 ft/s with LRAD for improved gait efficiency and functional mobility   Baseline: 2.06 ft/s without AD; 3.46 ft/s without AD on 5/15  Goal status: MET        LONG TERM GOALS: Target date: 04/02/2022   Pt will be independent with final HEP for improved strength, balance, transfers and gait.   Baseline:  Goal status: MET   2.  Pt will ambulate greater than or equal to 1,000 feet on 6MWT with  LRAD mod I for improved cardiovascular endurance and BLE strength.  Baseline: 71' w/SBQC and CGA; 1,375' no AD on 6/5 Goal status: MET   3.  Pt will improve Berg score to 51/56 for decreased fall risk   Baseline: 03/05/22: 56/56 Goal status: MET   4.  Pt will improve 5 x STS to less than or equal to 10 seconds without BUE support without dizziness to demonstrate improved functional strength and transfer efficiency.    Baseline: 25.91s without BUE support; 13.06s without UE support and mild dizziness on 5/15; 10s no UE support and no dizziness on 6/5  Goal status: MET     5.  Pt will improve gait velocity to at least 3.8 ft/s with LRAD for improved gait efficiency and performance at community ambulator level   Baseline: 2.04 ft/s without AD; 3.46 ft/s without AD on 5/15; 3.9 ft/s on 6/5  Goal status: MET   6. Pt will improve FGA to 28/30 for decreased fall risk   Baseline: 25/30 on 5/22; 29/30 on 6/5  Goal status: MET      ASSESSMENT:   CLINICAL IMPRESSION: Emphasis of skilled PT session on LTG assessment and DC from PT. Pt has met 6 of 6 LTGs, improving her gait speed, balance, endurance and global strength. Pt very satisfied with her progress in PT and verbalized readiness to DC today. Pt has been very compliant w/HEP and encouraged her to continue w/walking program and exercise post-DC for continued functional gains. Pt verbalized understanding for  obtaining new referral for PT if mobility needs change. Pt in agreement with DC.    OBJECTIVE IMPAIRMENTS Abnormal gait, decreased activity tolerance, decreased balance, decreased coordination, decreased endurance, decreased mobility, difficulty walking, decreased strength, impaired sensation, impaired UE functional use, and pain.    ACTIVITY LIMITATIONS cleaning, community activity, driving, meal prep, occupation, and medication management.    PERSONAL FACTORS Fitness and 1 comorbidity: dizziness  are also affecting patient's functional  outcome.      REHAB POTENTIAL: Good   CLINICAL DECISION MAKING: Stable/uncomplicated   EVALUATION COMPLEXITY: Low   PLAN: PT FREQUENCY: 2x/week   PT DURATION: 12 weeks   PLANNED INTERVENTIONS: Therapeutic exercises, Therapeutic activity, Neuromuscular re-education, Balance training, Gait training, Patient/Family education, Stair training, and DME instructions    Kaleel Schmieder E Mikah Rottinghaus, PT, DPT 03/24/22, 9:21 AM

## 2022-03-26 ENCOUNTER — Encounter: Payer: 59 | Admitting: Occupational Therapy

## 2022-03-26 ENCOUNTER — Ambulatory Visit: Payer: 59 | Admitting: Physical Therapy

## 2022-03-27 ENCOUNTER — Ambulatory Visit: Payer: 59 | Admitting: Physical Therapy

## 2022-03-27 ENCOUNTER — Ambulatory Visit: Payer: 59 | Admitting: Occupational Therapy

## 2022-03-31 ENCOUNTER — Ambulatory Visit: Payer: 59 | Admitting: Physical Therapy

## 2022-03-31 ENCOUNTER — Encounter: Payer: 59 | Admitting: Occupational Therapy

## 2022-04-01 ENCOUNTER — Encounter: Payer: Self-pay | Admitting: Neurology

## 2022-04-01 ENCOUNTER — Ambulatory Visit: Payer: 59 | Admitting: Neurology

## 2022-04-01 VITALS — BP 134/85 | HR 60 | Ht 64.0 in | Wt 146.0 lb

## 2022-04-01 DIAGNOSIS — I63449 Cerebral infarction due to embolism of unspecified cerebellar artery: Secondary | ICD-10-CM | POA: Diagnosis not present

## 2022-04-01 DIAGNOSIS — M542 Cervicalgia: Secondary | ICD-10-CM | POA: Diagnosis not present

## 2022-04-01 DIAGNOSIS — I6502 Occlusion and stenosis of left vertebral artery: Secondary | ICD-10-CM

## 2022-04-01 DIAGNOSIS — E782 Mixed hyperlipidemia: Secondary | ICD-10-CM | POA: Diagnosis not present

## 2022-04-01 MED ORDER — COQ10 200 MG PO CAPS: 1.0000 | ORAL_CAPSULE | ORAL | 1 refills | Status: AC

## 2022-04-01 MED ORDER — GABAPENTIN 100 MG PO CAPS: 100.0000 mg | ORAL_CAPSULE | Freq: Three times a day (TID) | ORAL | Status: DC

## 2022-04-01 NOTE — Progress Notes (Signed)
[facility] [address] [place]. [place] [id number] [phone]       OFFICE CONSULT NOTE  [name] Date of Birth:  1965-12-21 Medical Record Number:  [id number]   Referring MD: Jetty DuhamelJeffrey McClung  Reason for Referral: Stroke  HPI: Kristy Abbott is a 56 year old pleasant Caucasian lady seen today for initial office consultation visit for stroke.  History is obtained from patient and review of electronic medical records and I have personally reviewed pertinent available imaging films in PACS.  She has past medical history of hypothyroidism, Graves' disease and anemia.  She presented initially on 01/23/2021 with episode of dizziness at that time the CT angiogram showed left vertebral origin severe stenosis in the distal vertebral was filling very well without any decreased flow and this was treated conservatively.  She presented this time on 01/27/2022 to [facility] emergency room complaining of dizziness when she bent over.  She was dizzy off balance and the room was spinning and felt lightheaded and had to catch herself on the windowsill.  She also felt nauseous and took some Zofran which seemed to help.  She had some blurred vision and pain in the neck and the back on the left side.  She also stated she had somewhat similar episode 6 months ago which was transient.  Emergent CT scan of the head showed no acute abnormality but CT angiogram of the head and neck showed long segment narrowing of multifocal signal loss of left vertebral artery in the neck with a patent basilar artery.  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  LDL cholesterol 88 mg percent.  Hemoglobin A1c was 5.2.  Echocardiogram showed normal ejection fraction.  Patient was treated conservatively after doing aDiagnostic cerebral catheter angiogram shows occluded left vertebral artery at its origin with some distal  reconstitution through muscular collaterals.  Retrograde filling of the terminal left vertebral artery up to the PICA from the right vertebral.  She was on IV heparin for a few days and then transition to dual antiplatelet therapy.  She returned to the emergency room on 02/13/2022 with left-sided weakness numbness and scale of 8 but given recent stroke was not administered TNK and treated conservatively as repeat MRI scan did not show an acute infarct.  Patient's states that she is doing well though she feels tired and has some difficulty sleeping.  She has been having chronic neck pain for months and does not at times radiate down her spine to.  She has history of bulging disc but has not had any recent cervical spine imaging.  Occasionally she describes shooting pain down the spine.  She states she has very occasional dizzy spells now and feels lightheaded but they are not as bad.  She has had no falls or injuries these episodes are very transient.  She denies any difficulty in coordination walking or balance.  She is tolerating aspirin and Plavix together quite well without any bleeding and only minor bruising.  She is tolerating Crestor but complains of myalgias.  Her blood pressures under good control today it is 130/80.  ROS:   14 system review of systems is positive for dizziness, lightheadedness, imbalance all other systems negative  PMH:  Past Medical History:  Diagnosis Date   Anemia    Graves disease    Hypothyroidism     Social History:  Social History   Socioeconomic History   Marital status:  Married    Spouse name: Not on file   Number of children: Not on file   Years of education: Not on file   Highest education level: Not on file  Occupational History   Not on file  Tobacco Use   Smoking status: Former    Packs/day: 1.00    Types: Cigarettes    Start date: 06/07/1981    Quit date: 10/20/1998    Years since quitting: 23.4   Smokeless tobacco: Never  Vaping Use   Vaping  Use: Never used  Substance and Sexual Activity   Alcohol use: No   Drug use: No   Sexual activity: Yes  Other Topics Concern   Not on file  Social History Narrative   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   Social Determinants of Health   Financial Resource Strain: Not on file  Food Insecurity: Not on file  Transportation Needs: Not on file  Physical Activity: Not on file  Stress: Not on file  Social Connections: Not on file  Intimate Partner Violence: Not on file    Medications:   Current Outpatient Medications on File Prior to Visit  Medication Sig Dispense Refill   acetaminophen (TYLENOL) 325 MG tablet Take 2 tablets (650 mg total) by mouth every 6 (six) hours as needed for mild pain (or Fever >/= 101).     aspirin EC 81 MG EC tablet Take 1 tablet (81 mg total) by mouth daily. Swallow whole. 30 tablet 11   cetirizine (ZYRTEC ALLERGY) 10 MG tablet Take 1 tablet (10 mg total) by mouth daily. 30 tablet 0   clopidogrel (PLAVIX) 75 MG tablet Take 1 tablet (75 mg total) by mouth daily. 30 tablet 2   Ferrous Sulfate 142 (45 Fe) MG TBCR Take 45 mg by mouth every other day.     levothyroxine (SYNTHROID, LEVOTHROID) 88 MCG tablet Take 88 mcg by mouth daily before breakfast.     meclizine (ANTIVERT) 25 MG tablet Take 1 tablet (25 mg total) by mouth 3 (three) times daily as needed for dizziness. 15 tablet 0   omeprazole (PRILOSEC) 40 MG capsule Take 40 mg by mouth daily.     rosuvastatin (CRESTOR) 20 MG tablet Take 1 tablet (20 mg total) by mouth daily. 30 tablet 1   vitamin B-12 (CYANOCOBALAMIN) 500 MCG tablet Take 500 mcg by mouth daily.     No current facility-administered medications on file prior to visit.    Allergies:  No Known Allergies  Physical Exam General: well developed, well nourished, seated, in no evident distress Head: head normocephalic and atraumatic.   Neck: supple with no carotid or supraclavicular  bruits Cardiovascular: regular rate and rhythm, no murmurs Musculoskeletal: no deformity Skin:  no rash/petichiae Vascular:  Normal pulses all extremities  Neurologic Exam Mental Status: Awake and fully alert. Oriented to place and time. Recent and remote memory intact. Attention span, concentration and fund of knowledge appropriate. Mood and affect appropriate.  Cranial Nerves: Fundoscopic exam reveals sharp disc margins. Pupils equal, briskly reactive to light. Extraocular movements full without nystagmus. Visual fields full to confrontation. Hearing intact. Facial sensation intact. Face, tongue, palate moves normally and symmetrically.  Motor: Normal bulk and tone. Normal strength in all tested extremity muscles. Sensory.: intact to touch , pinprick , position and vibratory sensation.  Coordination: Rapid alternating movements normal in all extremities. Finger-to-nose and heel-to-shin performed accurately bilaterally. Gait and Station: Arises from chair without difficulty. Stance is  normal. Gait demonstrates normal stride length and balance . Able to heel, toe and tandem walk without difficulty.  Reflexes: 1+ and symmetric. Toes downgoing.   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  She is doing clinically quite well with very minimal symptoms.  Vascular risk factors of hyperlipidemia and vertebral artery occlusion.     PLAN:I had a long d/w patient  and her husband about her recent small cerebellar stroke and left vertebral artery occlusion,, risk for recurrent stroke/TIAs, personally independently reviewed imaging studies and stroke evaluation results and answered questions.Continue aspirin 81 mg daily and Plavix 75 mg daily for 1 more month and then stop Plavix and stay on aspirin alone for secondary stroke  prevention and maintain strict control of hypertension with blood pressure goal below 130/90, diabetes with hemoglobin A1c goal below 6.5% and lipids with LDL cholesterol goal below 70 mg/dL. I also advised the patient to eat a healthy diet with plenty of whole grains, cereals, fruits and vegetables, exercise regularly and maintain ideal body weight .start co-Q10 200 mg daily to help with statin myalgias.  Check MRI scan of the cervical spine for her persistent neck pain.  Try gabapentin 100 mg 3 times daily for her neck and radicular pain.  Followup in the future with my nurse practitioner in 6 months or call earlier if necessary.  Greater than 50% time during this 45-minute consultation visit were spent in coordination of care about her vertebral artery stenosis and cerebellar stroke discussion about stroke prevention and treatment and answering questions.  Delia Heady MD Note: This document was prepared with digital dictation and possible smart phrase technology. Any transcriptional errors that result from this process are unintentional.

## 2022-04-01 NOTE — Patient Instructions (Addendum)
I had a long d/w patient  and her husband about her recent small cerebellar stroke and left vertebral artery occlusion,, risk for recurrent stroke/TIAs, personally independently reviewed imaging studies and stroke evaluation results and answered questions.Continue aspirin 81 mg daily and Plavix 75 mg daily for 1 more month and then stop Plavix and stay on aspirin alone for secondary stroke prevention and maintain strict control of hypertension with blood pressure goal below 130/90, diabetes with hemoglobin A1c goal below 6.5% and lipids with LDL cholesterol goal below 70 mg/dL. I also advised the patient to eat a healthy diet with plenty of whole grains, cereals, fruits and vegetables, exercise regularly and maintain ideal body weight .start co-Q10 200 mg daily to help with statin myalgias.  Check MRI scan of the cervical spine for her persistent neck pain.  Try gabapentin 100 mg 3 times daily for her neck and radicular pain.  Followup in the future with my nurse practitioner in 6 months or call earlier if necessary.  Stroke Prevention Some medical conditions and behaviors can lead to a higher chance of having a stroke. You can help prevent a stroke by eating healthy, exercising, not smoking, and managing any medical conditions you have. Stroke is a leading cause of functional impairment. Primary prevention is particularly important because a majority of strokes are first-time events. Stroke changes the lives of not only those who experience a stroke but also their family and other caregivers. How can this condition affect me? A stroke is a medical emergency and should be treated right away. A stroke can lead to brain damage and can sometimes be life-threatening. If a person gets medical treatment right away, there is a better chance of surviving and recovering from a stroke. What can increase my risk? The following medical conditions may increase your risk of a stroke: Cardiovascular disease. High blood  pressure (hypertension). Diabetes. High cholesterol. Sickle cell disease. Blood clotting disorders (hypercoagulable state). Obesity. Sleep disorders (obstructive sleep apnea). Other risk factors include: Being older than age 84. Having a history of blood clots, stroke, or mini-stroke (transient ischemic attack, TIA). Genetic factors, such as race, ethnicity, or a family history of stroke. Smoking cigarettes or using other tobacco products. Taking birth control pills, especially if you also use tobacco. Heavy use of alcohol or drugs, especially cocaine and methamphetamine. Physical inactivity. What actions can I take to prevent this? Manage your health conditions High cholesterol levels. Eating a healthy diet is important for preventing high cholesterol. If cholesterol cannot be managed through diet alone, you may need to take medicines. Take any prescribed medicines to control your cholesterol as told by your health care provider. Hypertension. To reduce your risk of stroke, try to keep your blood pressure below 130/80. Eating a healthy diet and exercising regularly are important for controlling blood pressure. If these steps are not enough to manage your blood pressure, you may need to take medicines. Take any prescribed medicines to control hypertension as told by your health care provider. Ask your health care provider if you should monitor your blood pressure at home. Have your blood pressure checked every year, even if your blood pressure is normal. Blood pressure increases with age and some medical conditions. Diabetes. Eating a healthy diet and exercising regularly are important parts of managing your blood sugar (glucose). If your blood sugar cannot be managed through diet and exercise, you may need to take medicines. Take any prescribed medicines to control your diabetes as told by your health care provider.  Get evaluated for obstructive sleep apnea. Talk to your health care  provider about getting a sleep evaluation if you snore a lot or have excessive sleepiness. Make sure that any other medical conditions you have, such as atrial fibrillation or atherosclerosis, are managed. Nutrition Follow instructions from your health care provider about what to eat or drink to help manage your health condition. These instructions may include: Reducing your daily calorie intake. Limiting how much salt (sodium) you use to 1,500 milligrams (mg) each day. Using only healthy fats for cooking, such as olive oil, canola oil, or sunflower oil. Eating healthy foods. You can do this by: Choosing foods that are high in fiber, such as whole grains, and fresh fruits and vegetables. Eating at least 5 servings of fruits and vegetables a day. Try to fill one-half of your plate with fruits and vegetables at each meal. Choosing lean protein foods, such as lean cuts of meat, poultry without skin, fish, tofu, beans, and nuts. Eating low-fat dairy products. Avoiding foods that are high in sodium. This can help lower blood pressure. Avoiding foods that have saturated fat, trans fat, and cholesterol. This can help prevent high cholesterol. Avoiding processed and prepared foods. Counting your daily carbohydrate intake.  Lifestyle If you drink alcohol: Limit how much you have to: 0-1 drink a day for women who are not pregnant. 0-2 drinks a day for men. Know how much alcohol is in your drink. In the [place] one drink equals one 12 oz bottle of beer ( ), one 5 oz glass of wine ( ), or one 1 oz glass of hard liquor (35mL). Do not use any products that contain nicotine or tobacco. These products include cigarettes, chewing tobacco, and vaping devices, such as e-cigarettes. If you need help quitting, ask your health care provider. Avoid secondhand smoke. Do not use drugs. Activity  Try to stay at a healthy weight. Get at least 30 minutes of exercise on most days, such as: Fast  walking. Biking. Swimming. Medicines Take over-the-counter and prescription medicines only as told by your health care provider. Aspirin or blood thinners (antiplatelets or anticoagulants) may be recommended to reduce your risk of forming blood clots that can lead to stroke. Avoid taking birth control pills. Talk to your health care provider about the risks of taking birth control pills if: You are over 67 years old. You smoke. You get very bad headaches. You have had a blood clot. Where to find more information American Stroke Association: www.strokeassociation.org Get help right away if: You or a loved one has any symptoms of a stroke. "BE FAST" is an easy way to remember the main warning signs of a stroke: B - Balance. Signs are dizziness, sudden trouble walking, or loss of balance. E - Eyes. Signs are trouble seeing or a sudden change in vision. F - Face. Signs are sudden weakness or numbness of the face, or the face or eyelid drooping on one side. A - Arms. Signs are weakness or numbness in an arm. This happens suddenly and usually on one side of the body. S - Speech. Signs are sudden trouble speaking, slurred speech, or trouble understanding what people say. T - Time. Time to call emergency services. Write down what time symptoms started. You or a loved one has other signs of a stroke, such as: A sudden, severe headache with no known cause. Nausea or vomiting. Seizure. These symptoms may represent a serious problem that is an emergency. Do not wait to see if the symptoms  will go away. Get medical help right away. Call your local emergency services (911 in the [place] Do not drive yourself to the hospital. Summary You can help to prevent a stroke by eating healthy, exercising, not smoking, limiting alcohol intake, and managing any medical conditions you may have. Do not use any products that contain nicotine or tobacco. These include cigarettes, chewing tobacco, and vaping devices,  such as e-cigarettes. If you need help quitting, ask your health care provider. Remember "BE FAST" for warning signs of a stroke. Get help right away if you or a loved one has any of these signs. This information is not intended to replace advice given to you by your health care provider. Make sure you discuss any questions you have with your health care provider. Document Revised: 05/07/2020 Document Reviewed: 05/07/2020 Elsevier Patient Education  2023 [employer]

## 2022-04-02 ENCOUNTER — Encounter: Payer: 59 | Admitting: Occupational Therapy

## 2022-04-02 ENCOUNTER — Ambulatory Visit: Payer: 59 | Admitting: Physical Therapy

## 2022-04-03 ENCOUNTER — Telehealth: Payer: Self-pay

## 2022-04-03 NOTE — Telephone Encounter (Signed)
Received return to work note for pt. Discussed with Dr. Pearlean Brownie and he was agreeable to giving clearance if pt felt like she was ready. I called discussed and pt reports she is ready to go back to work. I have completed and placed in MD's office for review and signature if he agrees. Tentative return to work date is 04/08/2022.

## 2022-04-08 ENCOUNTER — Other Ambulatory Visit: Payer: Self-pay | Admitting: Neurology

## 2022-04-08 NOTE — Telephone Encounter (Signed)
Patient requests anxiety meds for scan please

## 2022-04-08 NOTE — Addendum Note (Signed)
Addended by: Christophe Louis E on: 04/08/2022 02:03 PM   Modules accepted: Orders

## 2022-04-08 NOTE — Telephone Encounter (Signed)
Rx pended for MD review

## 2022-04-08 NOTE — Telephone Encounter (Signed)
30 mins MR cervical spine wo contrast Dr. Pearlean Brownie [facility] Harvey: [id number] exp. 04/08/22-05/23/22 scheduled at [facility] 04/15/22 at 8:30am

## 2022-04-10 MED ORDER — ALPRAZOLAM 0.5 MG PO TABS: ORAL_TABLET | ORAL | 0 refills | Status: DC

## 2022-04-10 NOTE — Addendum Note (Signed)
Addended by: Lindell Spar C on: 04/10/2022 10:22 AM   Modules accepted: Orders

## 2022-04-11 ENCOUNTER — Telehealth: Payer: Self-pay | Admitting: Neurology

## 2022-04-11 NOTE — Telephone Encounter (Signed)
Pt called wanting to know if something can be called in for her to keep her calm during her MRI. Please call in to the [facility] on [address]

## 2022-04-15 ENCOUNTER — Ambulatory Visit (INDEPENDENT_AMBULATORY_CARE_PROVIDER_SITE_OTHER): Payer: 59

## 2022-04-15 DIAGNOSIS — M542 Cervicalgia: Secondary | ICD-10-CM

## 2022-05-06 ENCOUNTER — Emergency Department (HOSPITAL_COMMUNITY): Payer: 59

## 2022-05-06 ENCOUNTER — Observation Stay (HOSPITAL_COMMUNITY)
Admission: EM | Admit: 2022-05-06 | Discharge: 2022-05-07 | Disposition: A | Discharge status: Home / Self Care | Payer: 59 | Attending: Family Medicine | Admitting: Family Medicine

## 2022-05-06 ENCOUNTER — Observation Stay (HOSPITAL_COMMUNITY): Payer: 59

## 2022-05-06 ENCOUNTER — Encounter (HOSPITAL_COMMUNITY): Payer: Self-pay

## 2022-05-06 ENCOUNTER — Other Ambulatory Visit: Payer: Self-pay

## 2022-05-06 DIAGNOSIS — H6691 Otitis media, unspecified, right ear: Secondary | ICD-10-CM | POA: Insufficient documentation

## 2022-05-06 DIAGNOSIS — E785 Hyperlipidemia, unspecified: Secondary | ICD-10-CM | POA: Diagnosis not present

## 2022-05-06 DIAGNOSIS — Z7902 Long term (current) use of antithrombotics/antiplatelets: Secondary | ICD-10-CM | POA: Insufficient documentation

## 2022-05-06 DIAGNOSIS — K219 Gastro-esophageal reflux disease without esophagitis: Secondary | ICD-10-CM | POA: Diagnosis present

## 2022-05-06 DIAGNOSIS — E782 Mixed hyperlipidemia: Secondary | ICD-10-CM

## 2022-05-06 DIAGNOSIS — Z79899 Other long term (current) drug therapy: Secondary | ICD-10-CM | POA: Diagnosis not present

## 2022-05-06 DIAGNOSIS — R42 Dizziness and giddiness: Secondary | ICD-10-CM | POA: Diagnosis present

## 2022-05-06 DIAGNOSIS — R2990 Unspecified symptoms and signs involving the nervous system: Secondary | ICD-10-CM | POA: Diagnosis not present

## 2022-05-06 DIAGNOSIS — Z87891 Personal history of nicotine dependence: Secondary | ICD-10-CM | POA: Insufficient documentation

## 2022-05-06 DIAGNOSIS — H6690 Otitis media, unspecified, unspecified ear: Secondary | ICD-10-CM

## 2022-05-06 DIAGNOSIS — Z7982 Long term (current) use of aspirin: Secondary | ICD-10-CM | POA: Insufficient documentation

## 2022-05-06 DIAGNOSIS — E039 Hypothyroidism, unspecified: Secondary | ICD-10-CM | POA: Insufficient documentation

## 2022-05-06 DIAGNOSIS — Z20822 Contact with and (suspected) exposure to covid-19: Secondary | ICD-10-CM | POA: Insufficient documentation

## 2022-05-06 DIAGNOSIS — Z8673 Personal history of transient ischemic attack (TIA), and cerebral infarction without residual deficits: Secondary | ICD-10-CM | POA: Insufficient documentation

## 2022-05-06 DIAGNOSIS — I2510 Atherosclerotic heart disease of native coronary artery without angina pectoris: Secondary | ICD-10-CM | POA: Insufficient documentation

## 2022-05-06 DIAGNOSIS — H6501 Acute serous otitis media, right ear: Secondary | ICD-10-CM | POA: Diagnosis not present

## 2022-05-06 DIAGNOSIS — R29818 Other symptoms and signs involving the nervous system: Secondary | ICD-10-CM | POA: Diagnosis not present

## 2022-05-06 DIAGNOSIS — G450 Vertebro-basilar artery syndrome: Secondary | ICD-10-CM

## 2022-05-06 LAB — RAPID URINE DRUG SCREEN, HOSP PERFORMED
Amphetamines: NOT DETECTED
Barbiturates: NOT DETECTED
Benzodiazepines: NOT DETECTED
Cocaine: NOT DETECTED
Opiates: NOT DETECTED
Tetrahydrocannabinol: NOT DETECTED

## 2022-05-06 LAB — I-STAT CHEM 8, ED
BUN: 14 mg/dL (ref 6–20)
Calcium, Ion: 1.06 mmol/L — ABNORMAL LOW (ref 1.15–1.40)
Chloride: 102 mmol/L (ref 98–111)
Creatinine, Ser: 0.4 mg/dL — ABNORMAL LOW (ref 0.44–1.00)
Glucose, Bld: 83 mg/dL (ref 70–99)
HCT: 37 % (ref 36.0–46.0)
Hemoglobin: 12.6 g/dL (ref 12.0–15.0)
Potassium: 4.3 mmol/L (ref 3.5–5.1)
Sodium: 135 mmol/L (ref 135–145)
TCO2: 21 mmol/L — ABNORMAL LOW (ref 22–32)

## 2022-05-06 LAB — RESP PANEL BY RT-PCR (FLU A&B, COVID) ARPGX2
Influenza A by PCR: NEGATIVE
Influenza B by PCR: NEGATIVE
SARS Coronavirus 2 by RT PCR: NEGATIVE

## 2022-05-06 LAB — PROTIME-INR
INR: 1 (ref 0.8–1.2)
Prothrombin Time: 12.7 seconds (ref 11.4–15.2)

## 2022-05-06 LAB — DIFFERENTIAL
Abs Immature Granulocytes: 0.01 10*3/uL (ref 0.00–0.07)
Basophils Absolute: 0 10*3/uL (ref 0.0–0.1)
Basophils Relative: 1 %
Eosinophils Absolute: 0.1 10*3/uL (ref 0.0–0.5)
Eosinophils Relative: 1 %
Immature Granulocytes: 0 %
Lymphocytes Relative: 38 %
Lymphs Abs: 1.9 10*3/uL (ref 0.7–4.0)
Monocytes Absolute: 0.4 10*3/uL (ref 0.1–1.0)
Monocytes Relative: 8 %
Neutro Abs: 2.6 10*3/uL (ref 1.7–7.7)
Neutrophils Relative %: 52 %

## 2022-05-06 LAB — COMPREHENSIVE METABOLIC PANEL
ALT: 19 U/L (ref 0–44)
AST: 20 U/L (ref 15–41)
Albumin: 4.9 g/dL (ref 3.5–5.0)
Alkaline Phosphatase: 62 U/L (ref 38–126)
Anion gap: 9 (ref 5–15)
BUN: 15 mg/dL (ref 6–20)
CO2: 24 mmol/L (ref 22–32)
Calcium: 9.6 mg/dL (ref 8.9–10.3)
Chloride: 104 mmol/L (ref 98–111)
Creatinine, Ser: 0.56 mg/dL (ref 0.44–1.00)
GFR, Estimated: >60 mL/min (ref >60)
Glucose, Bld: 93 mg/dL (ref 70–99)
Potassium: 4.1 mmol/L (ref 3.5–5.1)
Sodium: 137 mmol/L (ref 135–145)
Total Bilirubin: 1 mg/dL (ref 0.3–1.2)
Total Protein: 8.2 g/dL — ABNORMAL HIGH (ref 6.5–8.1)

## 2022-05-06 LAB — CBC
HCT: 41 % (ref 36.0–46.0)
Hemoglobin: 13.7 g/dL (ref 12.0–15.0)
MCH: 31.6 pg (ref 26.0–34.0)
MCHC: 33.4 g/dL (ref 30.0–36.0)
MCV: 94.5 fL (ref 80.0–100.0)
Platelets: 238 10*3/uL (ref 150–400)
RBC: 4.34 MIL/uL (ref 3.87–5.11)
RDW: 12.3 % (ref 11.5–15.5)
WBC: 5 10*3/uL (ref 4.0–10.5)
nRBC: 0 % (ref 0.0–0.2)

## 2022-05-06 LAB — URINALYSIS, ROUTINE W REFLEX MICROSCOPIC
Bacteria, UA: NONE SEEN
Bilirubin Urine: NEGATIVE
Glucose, UA: NEGATIVE mg/dL
Ketones, ur: NEGATIVE mg/dL
Nitrite: NEGATIVE
Protein, ur: NEGATIVE mg/dL
Specific Gravity, Urine: 1.025 (ref 1.005–1.030)
pH: 6 (ref 5.0–8.0)

## 2022-05-06 LAB — POCT I-STAT, CHEM 8
BUN: 14 mg/dL (ref 6–20)
Calcium, Ion: 1.17 mmol/L (ref 1.15–1.40)
Chloride: 104 mmol/L (ref 98–111)
Creatinine, Ser: 0.5 mg/dL (ref 0.44–1.00)
Glucose, Bld: 89 mg/dL (ref 70–99)
HCT: 44 % (ref 36.0–46.0)
Hemoglobin: 15 g/dL (ref 12.0–15.0)
Potassium: 4.1 mmol/L (ref 3.5–5.1)
Sodium: 137 mmol/L (ref 135–145)
TCO2: 22 mmol/L (ref 22–32)

## 2022-05-06 LAB — CBG MONITORING, ED: Glucose-Capillary: 91 mg/dL (ref 70–99)

## 2022-05-06 LAB — APTT: aPTT: 27 seconds (ref 24–36)

## 2022-05-06 LAB — ETHANOL: Alcohol, Ethyl (B): <10 mg/dL (ref <10)

## 2022-05-06 LAB — HIV ANTIBODY (ROUTINE TESTING W REFLEX): HIV Screen 4th Generation wRfx: NONREACTIVE

## 2022-05-06 MED ORDER — ASPIRIN 81 MG PO TBEC
81.0000 mg | DELAYED_RELEASE_TABLET | Freq: Every day | ORAL | Status: DC
  Administered 2022-05-07: 81 mg via ORAL
  Filled 2022-05-06: qty 1

## 2022-05-06 MED ORDER — MECLIZINE HCL 12.5 MG PO TABS
25.0000 mg | ORAL_TABLET | Freq: Three times a day (TID) | ORAL | Status: DC | PRN
  Administered 2022-05-06: 25 mg via ORAL
  Filled 2022-05-06: qty 2

## 2022-05-06 MED ORDER — PANTOPRAZOLE SODIUM 40 MG PO TBEC
40.0000 mg | DELAYED_RELEASE_TABLET | Freq: Every day | ORAL | Status: DC
  Administered 2022-05-07: 40 mg via ORAL
  Filled 2022-05-06: qty 1

## 2022-05-06 MED ORDER — ENOXAPARIN SODIUM 40 MG/0.4ML IJ SOSY
40.0000 mg | PREFILLED_SYRINGE | INTRAMUSCULAR | Status: DC
  Administered 2022-05-06: 40 mg via SUBCUTANEOUS
  Filled 2022-05-06: qty 0.4

## 2022-05-06 MED ORDER — ACETAMINOPHEN 160 MG/5ML PO SOLN: 650.0000 mg | ORAL | Status: DC | PRN

## 2022-05-06 MED ORDER — LORATADINE 10 MG PO TABS
10.0000 mg | ORAL_TABLET | Freq: Every day | ORAL | Status: DC
  Administered 2022-05-07: 10 mg via ORAL
  Filled 2022-05-06: qty 1

## 2022-05-06 MED ORDER — ONDANSETRON HCL 4 MG/2ML IJ SOLN: 4.0000 mg | Freq: Once | INTRAMUSCULAR | Status: AC

## 2022-05-06 MED ORDER — SODIUM CHLORIDE 0.9 % IV SOLN: INTRAVENOUS | Status: DC

## 2022-05-06 MED ORDER — ACETAMINOPHEN 325 MG PO TABS
650.0000 mg | ORAL_TABLET | ORAL | Status: DC | PRN
  Administered 2022-05-06 – 2022-05-07 (×3): 650 mg via ORAL
  Filled 2022-05-06 (×3): qty 2

## 2022-05-06 MED ORDER — IOHEXOL 350 MG/ML SOLN
100.0000 mL | Freq: Once | INTRAVENOUS | Status: AC | PRN
  Administered 2022-05-06: 100 mL via INTRAVENOUS

## 2022-05-06 MED ORDER — STROKE: EARLY STAGES OF RECOVERY BOOK: Freq: Once | Status: DC

## 2022-05-06 MED ORDER — LEVOTHYROXINE SODIUM 88 MCG PO TABS
88.0000 ug | ORAL_TABLET | Freq: Every day | ORAL | Status: DC
Start: 2022-05-07 — End: 2022-05-07
  Administered 2022-05-07: 88 ug via ORAL
  Filled 2022-05-06: qty 1

## 2022-05-06 MED ORDER — ONDANSETRON HCL 4 MG/2ML IJ SOLN
INTRAMUSCULAR | Status: AC
  Administered 2022-05-06: 4 mg via INTRAVENOUS
  Filled 2022-05-06: qty 2

## 2022-05-06 MED ORDER — FERROUS SULFATE 325 (65 FE) MG PO TABS
325.0000 mg | ORAL_TABLET | Freq: Every day | ORAL | Status: DC
  Administered 2022-05-07: 325 mg via ORAL
  Filled 2022-05-06: qty 1

## 2022-05-06 MED ORDER — VITAMIN B-12 100 MCG PO TABS
500.0000 ug | ORAL_TABLET | Freq: Every day | ORAL | Status: DC
  Administered 2022-05-07: 500 ug via ORAL
  Filled 2022-05-06: qty 5

## 2022-05-06 MED ORDER — HYDROMORPHONE HCL 1 MG/ML IJ SOLN
0.5000 mg | Freq: Once | INTRAMUSCULAR | Status: AC
  Administered 2022-05-06: 0.5 mg via INTRAVENOUS
  Filled 2022-05-06: qty 0.5

## 2022-05-06 MED ORDER — ACETAMINOPHEN 650 MG RE SUPP: 650.0000 mg | RECTAL | Status: DC | PRN

## 2022-05-06 MED ORDER — SENNOSIDES-DOCUSATE SODIUM 8.6-50 MG PO TABS: 1.0000 | ORAL_TABLET | Freq: Every evening | ORAL | Status: DC | PRN

## 2022-05-06 MED ORDER — LORAZEPAM 2 MG/ML IJ SOLN
1.0000 mg | Freq: Once | INTRAMUSCULAR | Status: AC
Start: 2022-05-06 — End: 2022-05-06
  Administered 2022-05-06: 1 mg via INTRAVENOUS
  Filled 2022-05-06: qty 1

## 2022-05-06 MED ORDER — ROSUVASTATIN CALCIUM 20 MG PO TABS
20.0000 mg | ORAL_TABLET | Freq: Every day | ORAL | Status: DC
  Administered 2022-05-07: 20 mg via ORAL
  Filled 2022-05-06: qty 1

## 2022-05-06 NOTE — H&P (Signed)
History and Physical    Patient: Kristy Abbott NAT:[id number] DOB: 1966-08-05 DOA: 05/06/2022 DOS: the patient was seen and examined on 05/06/2022 PCP: Monico Blitz, MD  Patient coming from: Home  Chief Complaint:  Chief Complaint  Patient presents with   Dizziness   HPI: Kristy Abbott is a 56 year old female with a history of coronary artery disease, Graves' disease, cerebellar stroke and left vertebral artery occlusion presenting with vertigo and headache.  The patient states that she had been in her usual state of health when she went to bed on the evening of 05/05/2022.  She woke up on the morning of 05/06/2022 and stated that she " did not feel right".  She went to work at which time she developed bilateral parietal occipital headache with some nausea and dizziness.  She felt like the room was spinning around.  She described a feeling of her ears feeling "clogged like [place]was in the mountains".  She denied any visual loss, focal extremity weakness, dysarthria, or sensory disturbance.  She stated that this dizziness and headache was similar to when she had her cerebellar stroke in April 2023.  The patient was admitted to the hospital from 01/26/2022 to 01/30/2022 when she was treated for her left cerebellar stroke and left vertebral artery occlusion.  She was directed to take aspirin and Plavix for 90 days and then go on to aspirin alone.  She stated that she started on aspirin monotherapy on April 01, 2022.  Patient denies fevers, chills, chest pain, dyspnea, vomiting, diarrhea, abdominal pain, dysuria, hematuria, hematochezia, and melena.  She has some nausea.  In the ED, the patient was afebrile hemodynamically stable with oxygen saturation 100% room air. WBC 5.0, hemoglobin 13.7, platelets 230,000.  Sodium 137, potassium 4.1, bicarbonate 24, serum creatinine 0.56.  AST 20, ALT 19, alk phosphatase 62, total bilirubin 1.0.  EKG shows sinus bradycardia with nonspecific T wave changes.  Alcohol level  was negative.  The patient was seen by teleneurology who recommended admission for stroke work-up.  Review of Systems: As mentioned in the history of present illness. All other systems reviewed and are negative. Past Medical History:  Diagnosis Date   Anemia    Graves disease    Hypothyroidism    Past Surgical History:  Procedure Laterality Date   CESAREAN SECTION     COLONOSCOPY N/A 11/10/2018   Procedure: COLONOSCOPY;  Surgeon: Danie Binder, MD;  Location: [facility];  Service: Endoscopy;  Laterality: N/A;  1:30   Cyst removed from neck     IR ANGIO INTRA EXTRACRAN SEL COM CAROTID INNOMINATE BILAT MOD SED  01/28/2022   IR ANGIO VERTEBRAL SEL SUBCLAVIAN INNOMINATE UNI L MOD SED  01/28/2022   IR ANGIO VERTEBRAL SEL VERTEBRAL UNI R MOD SED  01/28/2022   IR US GUIDE VASC ACCESS RIGHT  01/28/2022   TOTAL ABDOMINAL HYSTERECTOMY  04/07/2019   Dr. Gari Crown   Social History:  reports that she quit smoking about 23 years ago. Her smoking use included cigarettes. She started smoking about 40 years ago. She smoked an average of 1 pack per day. She has never used smokeless tobacco. She reports that she does not drink alcohol and does not use drugs.  No Known Allergies  Family History  Problem Relation Age of Onset   CAD Mother    Diabetes Mother    Colon cancer Neg Hx    Colon polyps Neg Hx     Prior to Admission medications  Medication Sig Start Date End Date Taking? Authorizing Provider  acetaminophen (TYLENOL) 325 MG tablet Take 2 tablets (650 mg total) by mouth every 6 (six) hours as needed for mild pain (or Fever >/= 101). 01/30/22  Yes Cherene Altes, MD  aspirin EC 81 MG EC tablet Take 1 tablet (81 mg total) by mouth daily. Swallow whole. 01/31/22  Yes Cherene Altes, MD  cetirizine (ZYRTEC ALLERGY) 10 MG tablet Take 1 tablet (10 mg total) by mouth daily. 11/05/19  Yes Avegno, Darrelyn Hillock, FNP  Ferrous Sulfate 142 (45 Fe) MG TBCR Take 45 mg by mouth every other day.   Yes  [provider]  levothyroxine (SYNTHROID, LEVOTHROID) 88 MCG tablet Take 88 mcg by mouth daily before breakfast.   Yes [provider]  meclizine (ANTIVERT) 25 MG tablet Take 1 tablet (25 mg total) by mouth 3 (three) times daily as needed for dizziness. 01/23/21  Yes Couture, Cortni S, PA-C  omeprazole (PRILOSEC) 40 MG capsule Take 40 mg by mouth daily.   Yes [provider]  rosuvastatin (CRESTOR) 20 MG tablet Take 1 tablet (20 mg total) by mouth daily. 01/31/22  Yes Cherene Altes, MD  vitamin B-12 (CYANOCOBALAMIN) 500 MCG tablet Take 500 mcg by mouth daily.   Yes [provider]    Physical Exam: Vitals:   05/06/22 1245 05/06/22 1300 05/06/22 1315 05/06/22 1330  BP: (!) 146/96 138/88 (!) 159/90 (!) 141/79  Pulse: (!) 47 (!) 49 (!) 51 (!) 52  Resp: (!) 24 19 13 14   Temp:      TempSrc:      SpO2: 92% 93% 97% 96%  Weight:      Height:       GENERAL:  A&O x 3, NAD, well developed, cooperative, follows commands HEENT: [place]/AT, No thrush, No icterus, No oral ulcers Neck:  No neck mass, No meningismus, soft, supple CV: RRR, no S3, no S4, no rub, no JVD Lungs:  CTA, no wheeze, no rhonchi, good air movement Abd: soft/NT +BS, nondistended Ext: No edema, no lymphangitis, no cyanosis, no rashes Neuro:  CN II-XII intact, strength 4/5 in RUE, RLE, strength 4/5 LUE, LLE; sensation intact bilateral; no dysmetria; babinski equivocal  Data Reviewed: Data reviewed in history  Assessment and Plan: * Vertigo Suspect a component due to otitis media on the Right -Appreciate Neurology Consult -PT/OT evaluation -Speech therapy eval -CT brain--negative for acute findings -MRI brain-- -CTA H&N--no LVO, no core infart; patent carotids, occluded left vertebral artery -01/28/2022 Echo--EF 60 to 65%, no WMA, no PFO, no thrombi -LDL-- -HbA1C-- -Antiplatelet--aspirin and plavix  x90 days, then plavix monotherapy  Otitis media Exam shows erythema and mild bulging of  the right tympanic membrane upper half Start cefdinir Likely contributing to dizziness  Hyperlipidemia Continue Crestor Check lipid panel  GERD (gastroesophageal reflux disease) Continue PPI  Hypothyroidism Continue levothyroxine      Advance Care Planning: FULL  Consults: neurology  Family Communication: spouse 7/18  Severity of Illness: The appropriate patient status for this patient is OBSERVATION. Observation status is judged to be reasonable and necessary in order to provide the required intensity of service to ensure the patient's safety. The patient's presenting symptoms, physical exam findings, and initial radiographic and laboratory data in the context of their medical condition is felt to place them at decreased risk for further clinical deterioration. Furthermore, it is anticipated that the patient will be medically stable for discharge from the hospital within 2 midnights of admission.  Author: Orson Eva, MD 05/06/2022 1:37 PM  For on call review [email or web address].

## 2022-05-06 NOTE — Assessment & Plan Note (Signed)
Continue Crestor Check lipid panel:  LDL IS 61 which is reassuring, no statin added at this time

## 2022-05-06 NOTE — Consult Note (Signed)
NEUROLOGY TELECONSULTATION NOTE   Date of service: May 06, 2022 Patient Name: Kristy Abbott MRN:  [id number] DOB:  1965-11-11 Reason for consult: telestroke  Requesting Provider: Dr. Bethann Berkshire Consult Participants: myself, bedside RN, telestroke RN, patient Location of the provider: [facility] Location of the patient: [facility]  This consult was provided via telemedicine with 2-way video and audio communication. The patient/family was informed that care would be provided in this way and agreed to receive care in this manner.   _ _ _   _ __   _ __ _ _  __ __   _ __   __ _  History of Present Illness   This is a 56 yo woman with pmhx significant for prior small cerebellar vermis ischemic infarct 2022, known multifocal occlusion and reconstitution L vertebral artery, chronic headache, f/b Dr. Pearlean Brownie outpatient neurology, who presents with vertigo, nausea, and vomiting. LKW last night before bed. She woke up feeling nauseated, generally weak, had worse headache than usual, and "like my ears were made of mountains" which she is unable to further characterize. Around 1030 she developed vertigo / sensation of room spinning which she often has transiently but today it did not stop so she came to ED for further evaluation. NIHSS = 0 but patient still had persistent n/v/vertigo. TNK was not administered 2/2 LKW of last night. 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 CTA showed unchanged L vert occlusion without new LVO. CTP showed no perfusion deficit.    ROS   Per HPI; all other systems reviewed and are negative  Past History   The following was personally reviewed:  Past Medical History:  Diagnosis Date   Anemia    Graves disease    Hypothyroidism    Past Surgical History:  Procedure Laterality Date   CESAREAN SECTION     COLONOSCOPY N/A 11/10/2018   Procedure: COLONOSCOPY;  Surgeon: West Bali, MD;  Location: [facility];  Service: Endoscopy;  Laterality: N/A;  1:30   Cyst removed  from neck     IR ANGIO INTRA EXTRACRAN SEL COM CAROTID INNOMINATE BILAT MOD SED  01/28/2022   IR ANGIO VERTEBRAL SEL SUBCLAVIAN INNOMINATE UNI L MOD SED  01/28/2022   IR ANGIO VERTEBRAL SEL VERTEBRAL UNI R MOD SED  01/28/2022   IR US GUIDE VASC ACCESS RIGHT  01/28/2022   TOTAL ABDOMINAL HYSTERECTOMY  04/07/2019   Dr. Ernestina Penna   Family History  Problem Relation Age of Onset   CAD Mother    Diabetes Mother    Colon cancer Neg Hx    Colon polyps Neg Hx    Social History   Socioeconomic History   Marital status: Married    Spouse name: Not on file   Number of children: Not on file   Years of education: Not on file   Highest education level: Not on file  Occupational History   Not on file  Tobacco Use   Smoking status: Former    Packs/day: 1.00    Types: Cigarettes    Start date: 06/07/1981    Quit date: 10/20/1998    Years since quitting: 23.5   Smokeless tobacco: Never  Vaping Use   Vaping Use: Never used  Substance and Sexual Activity   Alcohol use: No   Drug use: No   Sexual activity: Yes  Other Topics Concern   Not on file  Social History Narrative   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   Social Determinants of Health   Financial Resource Strain: Not on file  Food Insecurity: Not on file  Transportation Needs: Not on file  Physical Activity: Not on file  Stress: Not on file  Social Connections: Not on file   No Known Allergies  Medications   (Not in a hospital admission)    No current facility-administered medications for this encounter.  Current Outpatient Medications:    acetaminophen (TYLENOL) 325 MG tablet, Take 2 tablets (650 mg total) by mouth every 6 (six) hours as needed for mild pain (or Fever >/= 101)., Disp: , Rfl:    ALPRAZolam (XANAX) 0.5 MG tablet, Take 1-2 tablets thirty minutes prior to MRI.  May take one additional tablet before entering scanner, if needed.  MUST HAVE DRIVER., Disp: 3  tablet, Rfl: 0   aspirin EC 81 MG EC tablet, Take 1 tablet (81 mg total) by mouth daily. Swallow whole., Disp: 30 tablet, Rfl: 11   cetirizine (ZYRTEC ALLERGY) 10 MG tablet, Take 1 tablet (10 mg total) by mouth daily., Disp: 30 tablet, Rfl: 0   Ferrous Sulfate 142 (45 Fe) MG TBCR, Take 45 mg by mouth every other day., Disp: , Rfl:    gabapentin (NEURONTIN) 100 MG capsule, Take 1 capsule (100 mg total) by mouth 3 (three) times daily., Disp: 1 capsule, Rfl: CAPSULE   levothyroxine (SYNTHROID, LEVOTHROID) 88 MCG tablet, Take 88 mcg by mouth daily before breakfast., Disp: , Rfl:    meclizine (ANTIVERT) 25 MG tablet, Take 1 tablet (25 mg total) by mouth 3 (three) times daily as needed for dizziness., Disp: 15 tablet, Rfl: 0   omeprazole (PRILOSEC) 40 MG capsule, Take 40 mg by mouth daily., Disp: , Rfl:    rosuvastatin (CRESTOR) 20 MG tablet, Take 1 tablet (20 mg total) by mouth daily., Disp: 30 tablet, Rfl: 1   vitamin B-12 (CYANOCOBALAMIN) 500 MCG tablet, Take 500 mcg by mouth daily., Disp: , Rfl:   Vitals   Vitals:   05/06/22 1050 05/06/22 1050 05/06/22 1052  BP:  (!) 191/112   Pulse:  70   Resp:  (!) 22   Temp:  (!) 97.5 F (36.4 C)   TempSrc:  Oral   SpO2:  100%   Weight:   66.9 kg  Height: 5\' 4"  (1.626 m)       Body mass index is 25.32 kg/m.  Physical Exam   Exam performed over telemedicine with 2-way video and audio communication and with assistance of bedside RN  Physical Exam Gen: A&O x4, NAD Resp: normal WOB CV: extremities appear well-perfused  Neuro: *MS: A&O x4. Follows multi-step commands.  *Speech: nondysarthric, no aphasia, able to name and repeat *CN: PERRL 64mm, EOMI, VFF by confrontation, sensation intact, smile symmetric, hearing intact to voice *Motor:   Normal bulk.  No tremor, rigidity or bradykinesia. No pronator drift. All extremities appear full-strength and symmetric. *Sensory: SILT. Symmetric. No double-simultaneous extinction.  *Coordination:   Finger-to-nose, heel-to-shin, rapid alternating motions were intact. *Reflexes:  UTA 2/2 tele-exam *Gait: deferred  NIHSS = 0   Premorbid mRS = 1   Labs   CBC: No results for input(s): "WBC", "NEUTROABS", "HGB", "HCT", "MCV", "PLT" in the last 168 hours.  Basic Metabolic Panel:  Lab Results  Component Value Date   NA 137 02/13/2022   K 4.2 02/13/2022   CO2 25 02/13/2022   GLUCOSE 96 02/13/2022   BUN 15 02/13/2022   CREATININE 0.63 02/13/2022   CALCIUM 9.9 02/13/2022  GFRNONAA >60 02/13/2022   GFRAA >90 01/19/2014   Lipid Panel:  Lab Results  Component Value Date   LDLCALC 88 01/27/2022   HgbA1c:  Lab Results  Component Value Date   HGBA1C 4.9 01/27/2022   Urine Drug Screen:     Component Value Date/Time   LABOPIA NONE DETECTED 02/13/2022 1249   COCAINSCRNUR NONE DETECTED 02/13/2022 1249   LABBENZ NONE DETECTED 02/13/2022 1249   AMPHETMU NONE DETECTED 02/13/2022 1249   THCU NONE DETECTED 02/13/2022 1249   LABBARB NONE DETECTED 02/13/2022 1249    Alcohol Level     Component Value Date/Time   ETH <10 02/13/2022 1253     Impression   This is a 56 yo woman with pmhx significant for prior small cerebellar vermis ischemic infarct 2022, known multifocal occlusion and reconstitution L vertebral artery, chronic headache, f/b Dr. Pearlean Brownie outpatient neurology, who presents with vertigo, nausea, and vomiting. LKW last night before bed. She woke up feeling nauseated, generally weak, had worse headache than usual, and "like my ears were made of mountains" which she is unable to further characterize. Around 1030 she developed vertigo / sensation of room spinning which she often has transiently but today it did not stop so she came to ED for further evaluation. NIHSS = 0 but patient still had persistent n/v/vertigo. TNK was not administered 2/2 LKW of last night. 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 CTA showed unchanged L vert occlusion without new LVO. CTP showed no  perfusion deficit. Cannot rule out small cerebellar stroke therefore recommend admission to [facility] for stroke workup.  Recommendations   - Admit for stroke workup, OK to admit to [facility] - Permissive HTN x48 hrs from sx onset or until stroke ruled out by MRI goal BP <220/110. PRN labetalol or hydralazine if BP above these parameters. Avoid oral antihypertensives. - MRI brain wo contrast - No indication for repeat TTE - most recent was 01/28/22 - Check A1c and LDL + add statin per guidelines - ASA 81mg  daily + plavix 75mg  daily x90 days f/b plavix 75mg  daily monotherapy after that (90 days 2/2 severe intracranial stenosis) - q4 hr neuro checks - STAT head CT for any change in neuro exam - Tele - PT/OT/SLP - patient will not be able to be discharged from ED 2/2 need for PT eval in setting of severe vertigo - Stroke education - F/u with established outpatient neurologist Dr. after discharge  ______________________________________________________________________   Thank you for the opportunity to take part in the care of this patient. If you have any further questions, please contact the neurology consultation attending.  Signed,  , MD [facility] [phone]  If 7pm- 7am, please page neurology on call as listed in [facility].

## 2022-05-06 NOTE — Plan of Care (Signed)
  Problem: Pain Managment: Goal: General experience of comfort will improve Outcome: Progressing   Problem: Safety: Goal: Ability to remain free from injury will improve Outcome: Progressing   Problem: Skin Integrity: Goal: Risk for impaired skin integrity will decrease Outcome: Progressing   

## 2022-05-06 NOTE — Progress Notes (Signed)
RN transferred pt to [phone] via wheelchair from the ED. Received report from [place], [place]. See assessment. Will continue to monitor.

## 2022-05-06 NOTE — Hospital Course (Signed)
56 year old female with a history of coronary artery disease, Graves' disease, cerebellar stroke and left vertebral artery occlusion presenting with vertigo and headache.  The patient states that she had been in her usual state of health when she went to bed on the evening of 05/05/2022.  She woke up on the morning of 05/06/2022 and stated that she " did not feel right".  She went to work at which time she developed bilateral parietal occipital headache with some nausea and dizziness.  She felt like the room was spinning around.  She described a feeling of her ears feeling "clogged like [place]was in the mountains".  She denied any visual loss, focal extremity weakness, dysarthria, or sensory disturbance.  She stated that this dizziness and headache was similar to when she had her cerebellar stroke in April 2023.  The patient was admitted to the hospital from 01/26/2022 to 01/30/2022 when she was treated for her left cerebellar stroke and left vertebral artery occlusion.  She was directed to take aspirin and Plavix for 90 days and then go on to aspirin alone.  She stated that she started on aspirin monotherapy on April 01, 2022.  Patient denies fevers, chills, chest pain, dyspnea, vomiting, diarrhea, abdominal pain, dysuria, hematuria, hematochezia, and melena.  She has some nausea.  In the ED, the patient was afebrile hemodynamically stable with oxygen saturation 100% room air. WBC 5.0, hemoglobin 13.7, platelets 230,000.  Sodium 137, potassium 4.1, bicarbonate 24, serum creatinine 0.56.  AST 20, ALT 19, alk phosphatase 62, total bilirubin 1.0.  EKG shows sinus bradycardia with nonspecific T wave changes.  Alcohol level was negative.  The patient was seen by teleneurology who recommended admission for stroke work-up.

## 2022-05-06 NOTE — Consult Note (Signed)
1059 cart activated 1102 Message to neuro 1104 Dr Selina Cooley joins cart

## 2022-05-06 NOTE — Assessment & Plan Note (Addendum)
Suspect due to otitis media of right ear -Appreciate Neurology Consult and med recommendations -PT/OT evaluation- no follow up needed -CT brain--negative for acute findings -MRI brain--no acute findings -CTA H&N--no LVO, no core infart; patent carotids, occluded left vertebral artery -01/28/2022 Echo--EF 60 to 65%, no WMA, no PFO, no thrombi -LDL--61 -HbA1C--4.8 -Antiplatelet--aspirin and plavix x 90 days, then plavix monotherapy -follow up with Dr. Pearlean Brownie her neurologist

## 2022-05-06 NOTE — Assessment & Plan Note (Signed)
Continue PPI ?

## 2022-05-06 NOTE — Assessment & Plan Note (Addendum)
Exam shows erythema and mild bulging of the right tympanic membrane upper half continue cefdinir 300 mg BID x 5d plus flonase nasal spray Suspect this is contributing to dizziness PT eval completed and no further PT needs identified.

## 2022-05-06 NOTE — Assessment & Plan Note (Signed)
Continue levothyroxine 

## 2022-05-06 NOTE — ED Triage Notes (Signed)
Pt presents to ED with complaints of dizziness, headache, and neck pain started at 1030. Pt states she has felt nauseated all am. Pt states her symptoms started suddenly. Dr Estell Harpin in triage to assess pt. Pt take ASA daily

## 2022-05-06 NOTE — Progress Notes (Signed)
1054 call time 1054 beeper time 1104 exam started 1105 exam finished 1106 images sent to soc 1107 exam completed in epic 1106 [facility] radiology called

## 2022-05-07 DIAGNOSIS — K219 Gastro-esophageal reflux disease without esophagitis: Secondary | ICD-10-CM | POA: Diagnosis not present

## 2022-05-07 DIAGNOSIS — R42 Dizziness and giddiness: Secondary | ICD-10-CM | POA: Diagnosis not present

## 2022-05-07 DIAGNOSIS — E782 Mixed hyperlipidemia: Secondary | ICD-10-CM | POA: Diagnosis not present

## 2022-05-07 DIAGNOSIS — E039 Hypothyroidism, unspecified: Secondary | ICD-10-CM | POA: Diagnosis not present

## 2022-05-07 LAB — LIPID PANEL
Cholesterol: 115 mg/dL (ref 0–200)
HDL: 40 mg/dL — ABNORMAL LOW (ref >40)
LDL Cholesterol: 61 mg/dL (ref 0–99)
Total CHOL/HDL Ratio: 2.9 RATIO
Triglycerides: 68 mg/dL (ref <150)
VLDL: 14 mg/dL (ref 0–40)

## 2022-05-07 LAB — HEMOGLOBIN A1C
Hgb A1c MFr Bld: 4.8 % (ref 4.8–5.6)
Mean Plasma Glucose: 91.06 mg/dL

## 2022-05-07 MED ORDER — FLUTICASONE PROPIONATE 50 MCG/ACT NA SUSP: 2.0000 | Freq: Every day | NASAL | 0 refills | Status: AC

## 2022-05-07 MED ORDER — CLOPIDOGREL BISULFATE 75 MG PO TABS: 75.0000 mg | ORAL_TABLET | Freq: Every day | ORAL | 2 refills | Status: DC

## 2022-05-07 MED ORDER — CEFDINIR 300 MG PO CAPS: 300.0000 mg | ORAL_CAPSULE | Freq: Two times a day (BID) | ORAL | 0 refills | Status: AC

## 2022-05-07 MED ORDER — FLUTICASONE PROPIONATE 50 MCG/ACT NA SUSP: 2.0000 | Freq: Every day | NASAL | Status: DC

## 2022-05-07 MED ORDER — ASPIRIN 81 MG PO TBEC: 81.0000 mg | DELAYED_RELEASE_TABLET | Freq: Every day | ORAL | Status: AC

## 2022-05-07 MED ORDER — CEFDINIR 300 MG PO CAPS: 300.0000 mg | ORAL_CAPSULE | Freq: Two times a day (BID) | ORAL | Status: DC

## 2022-05-07 MED ORDER — PANTOPRAZOLE SODIUM 40 MG PO TBEC: 40.0000 mg | DELAYED_RELEASE_TABLET | Freq: Every day | ORAL | 2 refills | Status: AC

## 2022-05-07 NOTE — TOC Progression Note (Signed)
  Transition of Care [facility]) Screening Note   Patient Details  Name: Kristy Abbott Date of Birth: 08/12/1966   Transition of Care [facility]) CM/SW Contact:    Leitha Bleak, RN Phone Number: 05/07/2022, 9:52 AM    Transition of Care Department [facility]) has reviewed patient and no TOC needs have been identified at this time. We will continue to monitor patient advancement through interdisciplinary progression rounds. If new patient transition needs arise, please place a TOC consult.      Barriers to Discharge: No Barriers Identified

## 2022-05-07 NOTE — Progress Notes (Signed)
SLP Cancellation Note  Patient Details Name: Kristy Abbott MRN: [id number] DOB: Mar 02, 1966   Cancelled treatment:       Reason Eval/Treat Not Completed: SLP screened, no needs identified, will sign off. SLP screened Pt in room. Pt denies any changes in swallowing, speech, language, or cognition. MRI negative for acute changes. SLE will be deferred at this time. Reconsult if indicated. SLP will sign off.   Thank you,  Havery Moros, CCC-SLP [phone]  Joan Herschberger 05/07/2022, 1:27 PM

## 2022-05-07 NOTE — Evaluation (Signed)
Physical Therapy Evaluation Patient Details Name: Kristy Abbott MRN: [id number] DOB: [birth year] Today's Date: 05/07/2022  History of Present Illness  Kristy Abbott is a 56 year old female with a history of coronary artery disease, Graves' disease, cerebellar stroke and left vertebral artery occlusion presenting with vertigo and headache.  The patient states that she had been in her usual state of health when she went to bed on the evening of 05/05/2022.  She woke up on the morning of 05/06/2022 and stated that she " did not feel right".  She went to work at which time she developed bilateral parietal occipital headache with some nausea and dizziness.  She felt like the room was spinning around.  She described a feeling of her ears feeling "clogged like [place]".  She denied any visual loss, focal extremity weakness, dysarthria, or sensory disturbance.  She stated that this dizziness and headache was similar to when she had her cerebellar stroke in April 2023.  The patient was admitted to the hospital from 01/26/2022 to 01/30/2022 when she was treated for her left cerebellar stroke and left vertebral artery occlusion.  She was directed to take aspirin and Plavix for 90 days and then go on to aspirin alone.  She stated that she started on aspirin monotherapy on April 01, 2022.  Patient denies fevers, chills, chest pain, dyspnea, vomiting, diarrhea, abdominal pain, dysuria, hematuria, hematochezia, and melena.  She has some nausea.   Clinical Impression  Patient presents supine in bed with husband in the room. Patient consents to PT/OT co-evaluation. Patient is modified independent with bed mobility demonstrating slight labored movement to complete task. Good trunk control observed with ability to reaching sitting position and scoot to EOB. Reports of dizziness once in sitting position. Patient is modified independent with transfers and no AD. Slow cadence secondary to dizziness but fair  coordination and balance. Demonstrates appropriate strength needed for transfers. Patient able to ambulate 150 feet in hallway with supervision and no AD. Mild gait deviations with drifting L/R but able to maintain appropriate gait pattern. Patient functioning near/at baseline for functional mobility and gait. Patient tolerated sitting up in chair after therapy with nursing staff notified of mobility status. Patient discharged to care of nursing for ambulation daily as tolerated for length of stay.       Recommendations for follow up therapy are one component of a multi-disciplinary discharge planning process, led by the attending physician.  Recommendations may be updated based on patient status, additional functional criteria and insurance authorization.  Follow Up Recommendations No PT follow up      Assistance Recommended at Discharge PRN  Patient can return home with the following  A little help with walking and/or transfers;Help with stairs or ramp for entrance    Equipment Recommendations None recommended by PT  Recommendations for Other Services       Functional Status Assessment Patient has not had a recent decline in their functional status     Precautions / Restrictions Precautions Precautions: Fall Restrictions Weight Bearing Restrictions: No      Mobility  Bed Mobility Overal bed mobility: Modified Independent             General bed mobility comments: Modified independent with bed mobility. Slight labored and slow movement. Good trunk control demonstrated. Reports dizziness once reaching sitting position.    Transfers Overall transfer level: Modified independent Equipment used: None               General  transfer comment: Modified independent with transfers and no AD. Slow cadence secondary to dizziness but fair coordination and balance. Demonstrates appropriate strength needed for transfers.    Ambulation/Gait Ambulation/Gait assistance:  Supervision Gait Distance (Feet): 150 Feet Assistive device: None Gait Pattern/deviations: Decreased step length - right, Decreased step length - left, Decreased stride length, Drifts right/left, Trunk flexed, Narrow base of support Gait velocity: decreased     General Gait Details: Able to ambulate 150 feet in hallway with supervision and no AD. Mild gait deviations with drifting L/R but able to maintain gait appropriate gait pattern.  Stairs            Wheelchair Mobility    Modified Rankin (Stroke Patients Only)       Balance Overall balance assessment: Independent                                           Pertinent Vitals/Pain Pain Assessment Pain Assessment: 0-10 Pain Score: 8  Pain Location: neack and head Pain Descriptors / Indicators: Other (Comment), Pressure Pain Intervention(s): Limited activity within patient's tolerance, Monitored during session, Repositioned    Home Living Family/patient expects to be discharged to:: Private residence Living Arrangements: Spouse/significant other Available Help at Discharge: Family;Available 24 hours/day Type of Home: House Home Access: Stairs to enter Entrance Stairs-Rails: Right Entrance Stairs-Number of Steps: 3   Home Layout: One level Home Equipment: Cane - single point Additional Comments: Pt reported no change in home living from recent admission.    Prior Function Prior Level of Function : Independent/Modified Independent;Working/employed;Other (comment)             Mobility Comments: Patient reports ambulating independently at home and in the community with no AD. ADLs Comments: Pt does not drive. Patient reports being independent with ADL's.     Hand Dominance   Dominant Hand: Right    Extremity/Trunk Assessment   Upper Extremity Assessment Upper Extremity Assessment: Defer to OT evaluation    Lower Extremity Assessment Lower Extremity Assessment: Overall WFL for tasks  assessed    Cervical / Trunk Assessment Cervical / Trunk Assessment: Normal  Communication   Communication: No difficulties  Cognition Arousal/Alertness: Awake/alert Behavior During Therapy: WFL for tasks assessed/performed Overall Cognitive Status: Within Functional Limits for tasks assessed                                          General Comments      Exercises     Assessment/Plan    PT Assessment Patient does not need any further PT services  PT Problem List Decreased activity tolerance;Decreased balance;Decreased mobility;Decreased coordination       PT Treatment Interventions      PT Goals (Current goals can be found in the Care Plan section)  Acute Rehab PT Goals Patient Stated Goal: return home PT Goal Formulation: With patient Time For Goal Achievement: 05/07/22 Potential to Achieve Goals: Good    Frequency       Co-evaluation PT/OT/SLP Co-Evaluation/Treatment: Yes Reason for Co-Treatment: To address functional/ADL transfers PT goals addressed during session: Mobility/safety with mobility;Balance;Strengthening/ROM OT goals addressed during session: ADL's and self-care       AM-PAC PT "6 Clicks" Mobility  Outcome Measure Help needed turning from your back to your side while in a  flat bed without using bedrails?: None Help needed moving from lying on your back to sitting on the side of a flat bed without using bedrails?: None Help needed moving to and from a bed to a chair (including a wheelchair)?: None Help needed standing up from a chair using your arms (e.g., wheelchair or bedside chair)?: None Help needed to walk in hospital room?: A Little Help needed climbing 3-5 steps with a railing? : A Little 6 Click Score: 22    End of Session   Activity Tolerance: Patient tolerated treatment well;No increased pain;Patient limited by fatigue Patient left: in chair;with call bell/phone within reach Nurse Communication: Mobility status PT  Visit Diagnosis: Unsteadiness on feet (R26.81);Other abnormalities of gait and mobility (R26.89);Muscle weakness (generalized) (M62.81)    Time: 1610-9604 PT Time Calculation (min) (ACUTE ONLY): 21 min   Charges:   PT Evaluation $PT Eval Moderate Complexity: 1 Mod PT Treatments $Therapeutic Activity: 23-37 mins        11:31 AM, 05/07/22 Arther Abbott, S/PT

## 2022-05-07 NOTE — Progress Notes (Signed)
Call from tele, pt HR in upper 30s on monitor.  Spoke with pt, her awake baseline is upper 40s-50s.  She denies any associated symptoms.  Pt was sleeping soundly when I first entered room.  Denies any needs at this time.  Reviewed with Dr. Thomes Dinning, continue to monitor.

## 2022-05-07 NOTE — Discharge Summary (Signed)
Physician Discharge Summary  Kristy Abbott DJT:[id number] DOB: 05-18-1966 DOA: 05/06/2022  PCP: Monico Blitz, MD  Admit date: 05/06/2022 Discharge date: 05/07/2022  Admitted From:  Home  Disposition: Home   Recommendations for Outpatient Follow-up:  Follow up with PCP in 1 weeks Follow up with neurologist Dr. Leonie Man in 2 weeks  Pt is to take aspirin plavix for 90 days followed by plavix monotherapy  Discharge Condition: STABLE   CODE STATUS: FULL DIET: resume prior home diet    Brief Hospitalization Summary: Please see all hospital notes, images, labs for full details of the hospitalization. 56 year old female with a history of coronary artery disease, Graves' disease, cerebellar stroke and left vertebral artery occlusion presenting with vertigo and headache.  The patient states that she had been in her usual state of health when she went to bed on the evening of 05/05/2022.  She woke up on the morning of 05/06/2022 and stated that she " did not feel right".  She went to work at which time she developed bilateral parietal occipital headache with some nausea and dizziness.  She felt like the room was spinning around.  She described a feeling of her ears feeling "[place]  She denied any visual loss, focal extremity weakness, dysarthria, or sensory disturbance.  She stated that this dizziness and headache was similar to when she had her cerebellar stroke in April 2023.  The patient was admitted to the hospital from 01/26/2022 to 01/30/2022 when she was treated for her left cerebellar stroke and left vertebral artery occlusion.  She was directed to take aspirin and Plavix for 90 days and then go on to aspirin alone.  She stated that she started on aspirin monotherapy on April 01, 2022.  Patient denies fevers, chills, chest pain, dyspnea, vomiting, diarrhea, abdominal pain, dysuria, hematuria, hematochezia, and melena.  She has some nausea.  In the ED, the patient was afebrile  hemodynamically stable with oxygen saturation 100% room air. WBC 5.0, hemoglobin 13.7, platelets 230,000.  Sodium 137, potassium 4.1, bicarbonate 24, serum creatinine 0.56.  AST 20, ALT 19, alk phosphatase 62, total bilirubin 1.0.  EKG shows sinus bradycardia with nonspecific T wave changes.  Alcohol level was negative.  The patient was seen by teleneurology who recommended admission for stroke work-up.  Hospital Course by problem   Assessment and Plan: * Vertigo Suspect due to otitis media of right ear -Appreciate Neurology Consult and med recommendations -PT/OT evaluation- no follow up needed -CT brain--negative for acute findings -MRI brain--no acute findings -CTA H&N--no LVO, no core infart; patent carotids, occluded left vertebral artery -01/28/2022 Echo--EF 60 to 65%, no WMA, no PFO, no thrombi -LDL--61 -HbA1C--4.8 -Antiplatelet--aspirin and plavix x 90 days, then plavix monotherapy -follow up with Dr. Leonie Man her neurologist   Otitis media Exam shows erythema and mild bulging of the right tympanic membrane upper half continue cefdinir 300 mg BID x 5d plus flonase nasal spray Suspect this is contributing to dizziness PT eval completed and no further PT needs identified.  Hyperlipidemia Continue Crestor Check lipid panel:  LDL IS 61 which is reassuring, no statin added at this time   GERD (gastroesophageal reflux disease) Continue PPI  Hypothyroidism Continue levothyroxine  Discharge Diagnoses:  Principal Problem:   Vertigo Active Problems:   Hypothyroidism   GERD (gastroesophageal reflux disease)   Hyperlipidemia   Otitis media  Discharge Instructions:  Allergies as of 05/07/2022   No Known Allergies      Medication List  STOP taking these medications    omeprazole 40 MG capsule Commonly known as: PRILOSEC Replaced by: pantoprazole 40 MG tablet       TAKE these medications    acetaminophen 325 MG tablet Commonly known as: TYLENOL Take 2 tablets  (650 mg total) by mouth every 6 (six) hours as needed for mild pain (or Fever >/= 101).   aspirin EC 81 MG tablet Take 1 tablet (81 mg total) by mouth daily. Swallow whole.   cefdinir 300 MG capsule Commonly known as: OMNICEF Take 1 capsule (300 mg total) by mouth every 12 (twelve) hours for 5 days.   cetirizine 10 MG tablet Commonly known as: ZyrTEC Allergy Take 1 tablet (10 mg total) by mouth daily.   clopidogrel 75 MG tablet Commonly known as: Plavix Take 1 tablet (75 mg total) by mouth daily.   cyanocobalamin 500 MCG tablet Commonly known as: CYANOCOBALAMIN Take 500 mcg by mouth daily.   Ferrous Sulfate 142 (45 Fe) MG Tbcr Take 45 mg by mouth every other day.   fluticasone 50 MCG/ACT nasal spray Commonly known as: FLONASE Place 2 sprays into both nostrils daily.   levothyroxine 88 MCG tablet Commonly known as: SYNTHROID Take 88 mcg by mouth daily before breakfast.   meclizine 25 MG tablet Commonly known as: ANTIVERT Take 1 tablet (25 mg total) by mouth 3 (three) times daily as needed for dizziness.   pantoprazole 40 MG tablet Commonly known as: PROTONIX Take 1 tablet (40 mg total) by mouth daily. Start taking on: May 08, 2022 Replaces: omeprazole 40 MG capsule   rosuvastatin 20 MG tablet Commonly known as: CRESTOR Take 1 tablet (20 mg total) by mouth daily.        Follow-up Information     Garvin Fila, MD. Schedule an appointment as soon as possible for a visit in 2 week(s).   Specialties: Neurology, Radiology Why: Hospital Follow Up Contact information: [address] [place] [address] [phone]         Monico Blitz, MD. Schedule an appointment as soon as possible for a visit in 1 week(s).   Specialty: Internal Medicine Why: Hospital Follow Up Contact information: [place] [address] [phone]                No Known Allergies Allergies as of 05/07/2022   No Known Allergies      Medication  List     STOP taking these medications    omeprazole 40 MG capsule Commonly known as: PRILOSEC Replaced by: pantoprazole 40 MG tablet       TAKE these medications    acetaminophen 325 MG tablet Commonly known as: TYLENOL Take 2 tablets (650 mg total) by mouth every 6 (six) hours as needed for mild pain (or Fever >/= 101).   aspirin EC 81 MG tablet Take 1 tablet (81 mg total) by mouth daily. Swallow whole.   cefdinir 300 MG capsule Commonly known as: OMNICEF Take 1 capsule (300 mg total) by mouth every 12 (twelve) hours for 5 days.   cetirizine 10 MG tablet Commonly known as: ZyrTEC Allergy Take 1 tablet (10 mg total) by mouth daily.   clopidogrel 75 MG tablet Commonly known as: Plavix Take 1 tablet (75 mg total) by mouth daily.   cyanocobalamin 500 MCG tablet Commonly known as: CYANOCOBALAMIN Take 500 mcg by mouth daily.   Ferrous Sulfate 142 (45 Fe) MG Tbcr Take 45 mg by mouth every other day.  fluticasone 50 MCG/ACT nasal spray Commonly known as: FLONASE Place 2 sprays into both nostrils daily.   levothyroxine 88 MCG tablet Commonly known as: SYNTHROID Take 88 mcg by mouth daily before breakfast.   meclizine 25 MG tablet Commonly known as: ANTIVERT Take 1 tablet (25 mg total) by mouth 3 (three) times daily as needed for dizziness.   pantoprazole 40 MG tablet Commonly known as: PROTONIX Take 1 tablet (40 mg total) by mouth daily. Start taking on: May 08, 2022 Replaces: omeprazole 40 MG capsule   rosuvastatin 20 MG tablet Commonly known as: CRESTOR Take 1 tablet (20 mg total) by mouth daily.        Procedures/Studies: MR BRAIN WO CONTRAST  Result Date: 05/06/2022 CLINICAL DATA:  Neuro deficit, acute, with stroke suspected. Nonspecific dizziness EXAM: MRI HEAD WITHOUT CONTRAST TECHNIQUE: Multiplanar, multiecho pulse sequences of the brain and surrounding structures were obtained without intravenous contrast. COMPARISON:  Head CT from earlier today  FINDINGS: Brain: No acute infarction, hemorrhage, hydrocephalus, extra-axial collection or mass lesion. Vascular: Normal flow voids. Skull and upper cervical spine: Normal marrow signal. Sinuses/Orbits: Negative. IMPRESSION: Stable and negative brain MRI. Electronically Signed   By: Jorje Guild M.D.   On: 05/06/2022 17:41   CT ANGIO HEAD NECK W WO CM W PERF (CODE STROKE)  Result Date: 05/06/2022 CLINICAL DATA:  Neuro deficit, acute, stroke suspected EXAM: CT ANGIOGRAPHY HEAD AND NECK CT PERFUSION BRAIN TECHNIQUE: Multidetector CT imaging of the head and neck was performed using the standard protocol during bolus administration of intravenous contrast. Multiplanar CT image reconstructions and MIPs were obtained to evaluate the vascular anatomy. Carotid stenosis measurements (when applicable) are obtained utilizing NASCET criteria, using the distal internal carotid diameter as the denominator. Multiphase CT imaging of the brain was performed following IV bolus contrast injection. Subsequent parametric perfusion maps were calculated using RAPID software. RADIATION DOSE REDUCTION: This exam was performed according to the departmental dose-optimization program which includes automated exposure control, adjustment of the mA and/or kV according to patient size and/or use of iterative reconstruction technique. CONTRAST:  163m OMNIPAQUE IOHEXOL 350 MG/ML SOLN COMPARISON:  CTA January 26, 2022. FINDINGS: CTA NECK FINDINGS Aortic arch: Great vessel origins are patent. Right carotid system: Mild atherosclerosis at the carotid bifurcation without greater than 50% stenosis. Left carotid system: No evidence of dissection, stenosis (50% or greater) or occlusion. Vertebral arteries: Right dominant. Right vertebral artery is patent without significant stenosis. The small/non dominant left vertebral artery is occluded proximally with poor reconstitution distally. Skeleton: No acute abnormality. Other neck: No acute abnormality.  Upper chest: Visualized lung apices are clear. Review of the MIP images confirms the above findings CTA HEAD FINDINGS Anterior circulation: Bilateral intracranial ICAs, MCAs, and ACAs are patent without proximal hemodynamically significant stenosis. Posterior circulation: Poor opacification of the left vertebral artery with suspected superimposed severe stenosis of the proximal intradural vertebral artery. Right intradural vertebral artery, basilar artery and bilateral posterior cerebral arteries are patent without proximal hemodynamically significant stenosis. Venous sinuses: As permitted by contrast timing, patent. CT Brain Perfusion Findings: ASPECTS: 10. CBF (<30%) Volume: 066mPerfusion (Tmax>6.0s) volume: 22m13mismatch Volume: 22mL56mfarction Location:None identified. IMPRESSION: 1. Occluded non dominant/small proximal left vertebral artery in the neck with poor distal reconstitution and suspected superimposed severe stenosis intradurally. Findings are similar to the prior. 2. No new/interval emergent large vessel occlusion. 3. No evidence of core infarct or penumbra on the CT perfusion. Findings discussed with Dr. StacQuinn Axe telephone at 11:33 a.m. Electronically Signed  By: Margaretha Sheffield M.D.   On: 05/06/2022 11:37   CT HEAD CODE STROKE WO CONTRAST  Result Date: 05/06/2022 CLINICAL DATA:  Code stroke. Neuro deficit, acute, stroke suspected. Dizziness and headache beginning 1030 hours. Associated nausea. EXAM: CT HEAD WITHOUT CONTRAST TECHNIQUE: Contiguous axial images were obtained from the base of the skull through the vertex without intravenous contrast. RADIATION DOSE REDUCTION: This exam was performed according to the departmental dose-optimization program which includes automated exposure control, adjustment of the mA and/or kV according to patient size and/or use of iterative reconstruction technique. COMPARISON:  MRI 02/13/2022 FINDINGS: Brain: Normal appearance without evidence of atrophy, old  or acute infarction, mass lesion, hemorrhage, hydrocephalus or extra-axial collection. Vascular: Atherosclerotic calcification of the vertebral arteries at the foramen magnum level. Skull: Normal. Sinuses/Orbits: Clear/normal. Other: None ASPECTS ([place] Stroke Program Early CT Score) - Ganglionic level infarction (caudate, lentiform nuclei, internal capsule, insula, M1-M3 cortex): 7 - Supraganglionic infarction (M4-M6 cortex): 3 Total score (0-10 with 10 being normal): 10 IMPRESSION: 1. Normal appearance of the brain itself. No CT finding related to the cerebellar vermian stroke seen in April of this year. Atherosclerotic calcification is noted affecting the vertebral arteries at the foramen magnum level. 2. ASPECTS is 10 3. These results were called by telephone at the time of interpretation on 05/06/2022 at 11:09 am to provider [facility] ZAMMIT , who verbally acknowledged these results. Electronically Signed   By: Nelson Chimes M.D.   On: 05/06/2022 11:13   MR CERVICAL SPINE WO CONTRAST  Result Date: 04/16/2022 [facility] NEUROIMAGING REPORT STUDY DATE: 04/15/22 PATIENT NAME: Kristy Abbott DOB: Aug 17, 1966 MRN: [id number] ORDERING CLINICIAN: Garvin Fila, MD CLINICAL HISTORY: 56 year old female with neck pain. EXAM: MR CERVICAL SPINE WO CONTRAST TECHNIQUE: MRI of the cervical spine was obtained utilizing multiplanar, multiecho pulse sequences. CONTRAST: none COMPARISON: 04/10/21 MRI IMAGING SITE: [facility] [place] [id number] [phone] FINDINGS: On sagittal views the vertebral bodies have normal height and alignment.  The spinal cord is normal in size and appearance. The posterior fossa, pituitary gland and paraspinal soft tissues are unremarkable.  On axial views there is no spinal stenosis or foraminal narrowing. Limited views of the soft tissues of the head and neck are unremarkable.    Unremarkable MRI cervical spine without contrast. No spinal stenosis or foraminal narrowing INTERPRETING PHYSICIAN: Penni Bombard, MD Certified in Neurology, Neurophysiology and Neuroimaging [facility] [address] [place], [place] [address] [phone]     Subjective: Pt reports still with pressure in right ear.  Has been ambulating well. Eating and drinking well.  Had mild headache this morning.    Discharge Exam: Vitals:   05/06/22 2106 05/07/22 0437  BP: (!) 99/59 (!) 110/59  Pulse: (!) 58 (!) 40  Resp: 16 16  Temp: 98 F (36.7 C) 97.8 F (36.6 C)  SpO2: 99% 98%   Vitals:   05/06/22 1751 05/06/22 2046 05/06/22 2106 05/07/22 0437  BP: 121/74 (!) 82/58 (!) 99/59 (!) 110/59  Pulse: (!) 52 (!) 49 (!) 58 (!) 40  Resp: _0 Temp: 98 F (36.7 C) 97.7 F (36.5 C) 98 F (36.7 C) 97.8 F (36.6 C)  TempSrc: Oral Oral    SpO2: 98% 96% 99% 98%  Weight:      Height:        General: Pt is alert, awake, not in  acute distress Cardiovascular: RRR, S1/S2 +, no rubs, no gallops Respiratory: CTA bilaterally, no wheezing, no rhonchi Abdominal: Soft, NT, ND, bowel sounds + Extremities: no edema, no cyanosis   The results of significant diagnostics from this hospitalization (including imaging, microbiology, ancillary and laboratory) are listed below for reference.     Microbiology: Recent Results (from the past 240 hour(s))  Resp Panel by RT-PCR (Flu A&B, Covid) Anterior Nasal Swab     Status: None   Collection Time: 05/06/22 10:53 AM   Specimen: Anterior Nasal Swab  Result Value Ref Range Status   SARS Coronavirus 2 by RT PCR NEGATIVE NEGATIVE Final    Comment: (NOTE) SARS-CoV-2 target nucleic acids are NOT DETECTED.  The SARS-CoV-2 RNA is generally detectable in upper respiratory specimens during the acute phase of infection. The lowest concentration of SARS-CoV-2 viral copies this assay can detect is 138 copies/mL. A negative result  does not preclude SARS-Cov-2 infection and should not be used as the sole basis for treatment or other patient management decisions. A negative result may occur with  improper specimen collection/handling, submission of specimen other than nasopharyngeal swab, presence of viral mutation(s) within the areas targeted by this assay, and inadequate number of viral copies(<138 copies/mL). A negative result must be combined with clinical observations, patient history, and epidemiological information. The expected result is Negative.  Fact Sheet for Patients:  EntrepreneurPulse.com.au  Fact Sheet for Healthcare Providers:  IncredibleEmployment.be  This test is no t yet approved or cleared by the [place] FDA and  has been authorized for detection and/or diagnosis of SARS-CoV-2 by FDA under an Emergency Use Authorization (EUA). This EUA will remain  in effect (meaning this test can be used) for the duration of the COVID-19 declaration under Section 564(b)(1) of the Act, 21 U.S.C.section 360bbb-3(b)(1), unless the authorization is terminated  or revoked sooner.       Influenza A by PCR NEGATIVE NEGATIVE Final   Influenza B by PCR NEGATIVE NEGATIVE Final    Comment: (NOTE) The Xpert Xpress SARS-CoV-2/FLU/RSV plus assay is intended as an aid in the diagnosis of influenza from Nasopharyngeal swab specimens and should not be used as a sole basis for treatment. Nasal washings and aspirates are unacceptable for Xpert Xpress SARS-CoV-2/FLU/RSV testing.  Fact Sheet for Patients: EntrepreneurPulse.com.au  Fact Sheet for Healthcare Providers: IncredibleEmployment.be  This test is not yet approved or cleared by the [place] FDA and has been authorized for detection and/or diagnosis of SARS-CoV-2 by FDA under an Emergency Use Authorization (EUA). This EUA will remain in effect (meaning this test can be used) for  the duration of the COVID-19 declaration under Section 564(b)(1) of the Act, 21 U.S.C. section 360bbb-3(b)(1), unless the authorization is terminated or revoked.  Performed at [facility], [address], [place], [place] [address]      Labs: BNP (last 3 results) No results for input(s): "BNP" in the last 8760 hours. Basic Metabolic Panel: Recent Labs  Lab 05/06/22 1108 05/06/22 1115 05/06/22 1136  NA 137 137 135  K 4.1 4.1 4.3  CL 104 104 102  CO2 24  --   --   GLUCOSE 93 89 83  BUN _0 CREATININE 0.56 0.50 0.40*  CALCIUM 9.6  --   --    Liver Function Tests: Recent Labs  Lab 05/06/22 1108  AST 20  ALT 19  ALKPHOS 62  BILITOT 1.0  PROT 8.2*  ALBUMIN 4.9   No results for input(s): "LIPASE", "AMYLASE" in  the last 168 hours. No results for input(s): "AMMONIA" in the last 168 hours. CBC: Recent Labs  Lab 05/06/22 1108 05/06/22 1115 05/06/22 1136  WBC 5.0  --   --   NEUTROABS 2.6  --   --   HGB 13.7 15.0 12.6  HCT 41.0 44.0 37.0  MCV 94.5  --   --   PLT 238  --   --    Cardiac Enzymes: No results for input(s): "CKTOTAL", "CKMB", "CKMBINDEX", "TROPONINI" in the last 168 hours. BNP: Invalid input(s): "POCBNP" CBG: Recent Labs  Lab 05/06/22 1058  GLUCAP 91   D-Dimer No results for input(s): "DDIMER" in the last 72 hours. Hgb A1c Recent Labs    05/07/22 0422  HGBA1C 4.8   Lipid Profile Recent Labs    05/07/22 0422  CHOL 115  HDL 40*  LDLCALC 61  TRIG 68  CHOLHDL 2.9   Thyroid function studies No results for input(s): "TSH", "T4TOTAL", "T3FREE", "THYROIDAB" in the last 72 hours.  Invalid input(s): "FREET3" Anemia work up No results for input(s): "VITAMINB12", "FOLATE", "FERRITIN", "TIBC", "IRON", "RETICCTPCT" in the last 72 hours. Urinalysis    Component Value Date/Time   COLORURINE COLORLESS (A) 05/06/2022 1053   APPEARANCEUR CLEAR 05/06/2022 1053   LABSPEC 1.025 05/06/2022 1053   PHURINE 6.0 05/06/2022 1053   GLUCOSEU  NEGATIVE 05/06/2022 1053   HGBUR SMALL (A) 05/06/2022 1053   BILIRUBINUR NEGATIVE 05/06/2022 1053   KETONESUR NEGATIVE 05/06/2022 1053   PROTEINUR NEGATIVE 05/06/2022 1053   NITRITE NEGATIVE 05/06/2022 1053   LEUKOCYTESUR SMALL (A) 05/06/2022 1053   Sepsis Labs Recent Labs  Lab 05/06/22 1108  WBC 5.0   Microbiology Recent Results (from the past 240 hour(s))  Resp Panel by RT-PCR (Flu A&B, Covid) Anterior Nasal Swab     Status: None   Collection Time: 05/06/22 10:53 AM   Specimen: Anterior Nasal Swab  Result Value Ref Range Status   SARS Coronavirus 2 by RT PCR NEGATIVE NEGATIVE Final    Comment: (NOTE) SARS-CoV-2 target nucleic acids are NOT DETECTED.  The SARS-CoV-2 RNA is generally detectable in upper respiratory specimens during the acute phase of infection. The lowest concentration of SARS-CoV-2 viral copies this assay can detect is 138 copies/mL. A negative result does not preclude SARS-Cov-2 infection and should not be used as the sole basis for treatment or other patient management decisions. A negative result may occur with  improper specimen collection/handling, submission of specimen other than nasopharyngeal swab, presence of viral mutation(s) within the areas targeted by this assay, and inadequate number of viral copies(<138 copies/mL). A negative result must be combined with clinical observations, patient history, and epidemiological information. The expected result is Negative.  Fact Sheet for Patients:  EntrepreneurPulse.com.au  Fact Sheet for Healthcare Providers:  IncredibleEmployment.be  This test is no t yet approved or cleared by the [place] FDA and  has been authorized for detection and/or diagnosis of SARS-CoV-2 by FDA under an Emergency Use Authorization (EUA). This EUA will remain  in effect (meaning this test can be used) for the duration of the COVID-19 declaration under Section 564(b)(1) of the Act,  21 U.S.C.section 360bbb-3(b)(1), unless the authorization is terminated  or revoked sooner.       Influenza A by PCR NEGATIVE NEGATIVE Final   Influenza B by PCR NEGATIVE NEGATIVE Final    Comment: (NOTE) The Xpert Xpress SARS-CoV-2/FLU/RSV plus assay is intended as an aid in the diagnosis of influenza from Nasopharyngeal swab specimens and should not be  used as a sole basis for treatment. Nasal washings and aspirates are unacceptable for Xpert Xpress SARS-CoV-2/FLU/RSV testing.  Fact Sheet for Patients: EntrepreneurPulse.com.au  Fact Sheet for Healthcare Providers: IncredibleEmployment.be  This test is not yet approved or cleared by the [place] FDA and has been authorized for detection and/or diagnosis of SARS-CoV-2 by FDA under an Emergency Use Authorization (EUA). This EUA will remain in effect (meaning this test can be used) for the duration of the COVID-19 declaration under Section 564(b)(1) of the Act, 21 U.S.C. section 360bbb-3(b)(1), unless the authorization is terminated or revoked.  Performed at [facility], [address], [place],  [address]    Time coordinating discharge: 36 mins  SIGNED:  Irwin Brakeman, MD  [facility] 05/07/2022, 12:47 PM How to contact the [facility] Attending or Consulting provider [place] or covering provider during after hours [place], for this patient?  Check the care team in [facility] and look for a) attending/consulting [facility] provider listed and b) the [facility] team listed Log into [email or web address] and use [place]'s universal password to access. If you do not have the password, please contact the hospital operator. Locate the [facility] provider you are looking for under [facility] and page to a number that you can be directly reached. If you still have difficulty reaching the provider, please page the [facility] (Director on Call) for the Hospitalists listed on amion for assistance.

## 2022-05-07 NOTE — Discharge Instructions (Addendum)
PLEASE TAKE ASPIRIN/PLAVIX FOR 90 DAYS, THEN STOP ASPIRIN BUT CONTINUE TO TAKE PLAVIX   PLEASE FOLLOW UP WITH DR. Pearlean Brownie AND YOUR PRIMARY CARE PROVIDER AS SOON AS YOU CAN FOR RECHECK.   PANTOPRAZOLE REPLACES OMEPRAZOLE DUE TO POTENTIAL DRUG INTERACTION.     IMPORTANT INFORMATION: PAY CLOSE ATTENTION   PHYSICIAN DISCHARGE INSTRUCTIONS  Follow with Primary care provider  [name], MD  and other consultants as instructed by your Hospitalist Physician  SEEK MEDICAL CARE OR RETURN TO EMERGENCY ROOM IF SYMPTOMS COME BACK, WORSEN OR NEW PROBLEM DEVELOPS   Please note: You were cared for by a hospitalist during your hospital stay. Every effort will be made to forward records to your primary care provider.  You can request that your primary care provider send for your hospital records if they have not received them.  Once you are discharged, your primary care physician will handle any further medical issues. Please note that NO REFILLS for any discharge medications will be authorized once you are discharged, as it is imperative that you return to your primary care physician (or establish a relationship with a primary care physician if you do not have one) for your post hospital discharge needs so that they can reassess your need for medications and monitor your lab values.  Please get a complete blood count and chemistry panel checked by your Primary MD at your next visit, and again as instructed by your Primary MD.  Get Medicines reviewed and adjusted: Please take all your medications with you for your next visit with your Primary MD  Laboratory/radiological data: Please request your Primary MD to go over all hospital tests and procedure/radiological results at the follow up, please ask your primary care provider to get all Hospital records sent to his/her office.  In some cases, they will be blood work, cultures and biopsy results pending at the time of your discharge. Please request that your  primary care provider follow up on these results.  If you are diabetic, please bring your blood sugar readings with you to your follow up appointment with primary care.    Please call and make your follow up appointments as soon as possible.    Also Note the following: If you experience worsening of your admission symptoms, develop shortness of breath, life threatening emergency, suicidal or homicidal thoughts you must seek medical attention immediately by calling 911 or calling your MD immediately  if symptoms less severe.  You must read complete instructions/literature along with all the possible adverse reactions/side effects for all the Medicines you take and that have been prescribed to you. Take any new Medicines after you have completely understood and accpet all the possible adverse reactions/side effects.   Do not drive when taking Pain medications or sleeping medications (Benzodiazepines)  Do not take more than prescribed Pain, Sleep and Anxiety Medications. It is not advisable to combine anxiety,sleep and pain medications without talking with your primary care practitioner  Special Instructions: If you have smoked or chewed Tobacco  in the last 2 yrs please stop smoking, stop any regular Alcohol  and or any Recreational drug use.  Wear Seat belts while driving.  Do not drive if taking any narcotic, mind altering or controlled substances or recreational drugs or alcohol.

## 2022-05-07 NOTE — Evaluation (Signed)
Occupational Therapy Evaluation Patient Details Name: Kristy Abbott MRN: [id number] DOB: [birth year] Today's Date: 05/07/2022   History of Present Illness Kristy Abbott is a 56 year old female with a history of coronary artery disease, Graves' disease, cerebellar stroke and left vertebral artery occlusion presenting with vertigo and headache.  The patient states that she had been in her usual state of health when she went to bed on the evening of 05/05/2022.  She woke up on the morning of 05/06/2022 and stated that she " did not feel right".  She went to work at which time she developed bilateral parietal occipital headache with some nausea and dizziness.  She felt like the room was spinning around.  She described a feeling of her ears feeling "cl[place] like [place]".  She denied any visual loss, focal extremity weakness, dysarthria, or sensory disturbance.  She stated that this dizziness and headache was similar to when she had her cerebellar stroke in April 2023.  The patient was admitted to the hospital from 01/26/2022 to 01/30/2022 when she was treated for her left cerebellar stroke and left vertebral artery occlusion.  She was directed to take aspirin and Plavix for 90 days and then go on to aspirin alone.  She stated that she started on aspirin monotherapy on April 01, 2022.  Patient denies fevers, chills, chest pain, dyspnea, vomiting, diarrhea, abdominal pain, dysuria, hematuria, hematochezia, and melena.  She has some nausea. (per MD)   Clinical Impression   Pt agreeable to OT and PT co-evaluation. Pt operating at or near baseline levels for functional ADL's and mobility. Able to ambulate in room and hall with no AD and no loss of balance noted. WFL B UE function use and strength. Pt is not recommended for further acute OT services and will be discharged to care of nursing staff for remaining length o of stay.       Recommendations for follow up therapy are one component of a  multi-disciplinary discharge planning process, led by the attending physician.  Recommendations may be updated based on patient status, additional functional criteria and insurance authorization.   Follow Up Recommendations  No OT follow up    Assistance Recommended at Discharge PRN  Patient can return home with the following      Functional Status Assessment  Patient has not had a recent decline in their functional status  Equipment Recommendations  None recommended by OT    Recommendations for Other Services       Precautions / Restrictions Precautions Precautions: Fall Restrictions Weight Bearing Restrictions: No      Mobility Bed Mobility                    Transfers Overall transfer level: Modified independent                 General transfer comment: Able to complete ambulatory transfer from EOB to chair without AD.      Balance Overall balance assessment: Independent                                         ADL either performed or assessed with clinical judgement   ADL Overall ADL's : Modified independent  General ADL Comments: Pt able to ambulate in room and hall and complete lower body dressing without assist and no AD.     Vision Baseline Vision/History: 1 Wears glasses Ability to See in Adequate Light: 1 Impaired Patient Visual Report: No change from baseline Vision Assessment?: No apparent visual deficits                Pertinent Vitals/Pain Pain Assessment Pain Assessment: 0-10 Pain Score: 8  Pain Location: neack and head Pain Descriptors / Indicators: Other (Comment), Pressure ("pressure on ears and head") Pain Intervention(s): Limited activity within patient's tolerance, Monitored during session, Repositioned     Hand Dominance Right   Extremity/Trunk Assessment Upper Extremity Assessment Upper Extremity Assessment: Overall WFL for tasks assessed    Lower Extremity Assessment Lower Extremity Assessment: Defer to PT evaluation   Cervical / Trunk Assessment Cervical / Trunk Assessment: Normal   Communication Communication Communication: No difficulties   Cognition Arousal/Alertness: Awake/alert Behavior During Therapy: WFL for tasks assessed/performed Overall Cognitive Status: Within Functional Limits for tasks assessed                                                        Home Living Family/patient expects to be discharged to:: Private residence Living Arrangements: Spouse/significant other Available Help at Discharge: Family;Available 24 hours/day Type of Home: House Home Access: Stairs to enter [employer] of Steps: 3 Entrance Stairs-Rails: Right Home Layout: One level     Bathroom Shower/Tub: Chief Strategy Officer: Standard Bathroom Accessibility: Yes How Accessible: Accessible via walker Home Equipment: Cane - single point   Additional Comments: Pt reported no change in home living from recent admission.      Prior Functioning/Environment Prior Level of Function : Independent/Modified Independent;Working/employed;Other (comment)               ADLs Comments: Pt does not drive.                                Co-evaluation PT/OT/SLP Co-Evaluation/Treatment: Yes Reason for Co-Treatment: To address functional/ADL transfers   OT goals addressed during session: ADL's and self-care      AM-PAC OT "6 Clicks" Daily Activity     Outcome Measure Help from another person eating meals?: None Help from another person taking care of personal grooming?: None Help from another person toileting, which includes using toliet, bedpan, or urinal?: None Help from another person bathing (including washing, rinsing, drying)?: None Help from another person to put on and taking off regular upper body clothing?: None Help from another person to put on and taking off  regular lower body clothing?: None 6 Click Score: 24   End of Session    Activity Tolerance: Patient tolerated treatment well Patient left: in chair;with call bell/phone within reach;with family/visitor present  OT Visit Diagnosis: Unsteadiness on feet (R26.81);Other abnormalities of gait and mobility (R26.89);Dizziness and giddiness (R42)                Time: 6073-7106 OT Time Calculation (min): 12 min Charges:  OT General Charges $OT Visit: 1 Visit OT Evaluation $OT Eval Low Complexity: 1 Low  Vishaal Strollo OT, MOT  Danie Chandler 05/07/2022, 9:43 AM

## 2022-05-08 NOTE — ED Provider Notes (Signed)
[facility] Provider Note   CSN: [id number] Arrival date & time: 05/06/22  1046     History  Chief Complaint  Patient presents with   Dizziness    Kristy Abbott is a 56 y.o. female.  Patient with a history of a cerebellar stroke.  Presents with vertigo symptoms and headache.  These are similar to her symptoms when she had a stroke before  The history is provided by the patient and medical records. No language interpreter was used.  Dizziness Quality:  Head spinning Severity:  Moderate Onset quality:  Sudden Timing:  Constant Progression:  Waxing and waning Chronicity:  New Context: not when bending over   Associated symptoms: no chest pain, no diarrhea and no headaches        Home Medications Prior to Admission medications   Medication Sig Start Date End Date Taking? Authorizing Provider  acetaminophen (TYLENOL) 325 MG tablet Take 2 tablets (650 mg total) by mouth every 6 (six) hours as needed for mild pain (or Fever >/= 101). 01/30/22  Yes Cherene Altes, MD  cetirizine (ZYRTEC ALLERGY) 10 MG tablet Take 1 tablet (10 mg total) by mouth daily. 11/05/19  Yes Avegno, Darrelyn Hillock, FNP  clopidogrel (PLAVIX) 75 MG tablet Take 1 tablet (75 mg total) by mouth daily. 05/07/22 05/07/23 Yes Johnson, Clanford L, MD  Ferrous Sulfate 142 (45 Fe) MG TBCR Take 45 mg by mouth every other day.   Yes [provider]  levothyroxine (SYNTHROID, LEVOTHROID) 88 MCG tablet Take 88 mcg by mouth daily before breakfast.   Yes [provider]  meclizine (ANTIVERT) 25 MG tablet Take 1 tablet (25 mg total) by mouth 3 (three) times daily as needed for dizziness. 01/23/21  Yes Couture, Cortni S, PA-C  rosuvastatin (CRESTOR) 20 MG tablet Take 1 tablet (20 mg total) by mouth daily. 01/31/22  Yes Cherene Altes, MD  vitamin B-12 (CYANOCOBALAMIN) 500 MCG tablet Take 500 mcg by mouth daily.   Yes [provider]  aspirin EC 81 MG tablet Take 1 tablet (81 mg  total) by mouth daily. Swallow whole. 05/07/22 08/05/22  Johnson, Clanford L, MD  cefdinir (OMNICEF) 300 MG capsule Take 1 capsule (300 mg total) by mouth every 12 (twelve) hours for 5 days. 05/07/22 05/12/22  Johnson, Clanford L, MD  fluticasone (FLONASE) 50 MCG/ACT nasal spray Place 2 sprays into both nostrils daily. 05/07/22   Johnson, Clanford L, MD  pantoprazole (PROTONIX) 40 MG tablet Take 1 tablet (40 mg total) by mouth daily. 05/08/22   Murlean Iba, MD      Allergies    Patient has no known allergies.    Review of Systems   Review of Systems  Constitutional:  Negative for appetite change and fatigue.  HENT:  Negative for congestion, ear discharge and sinus pressure.   Eyes:  Negative for discharge.  Respiratory:  Negative for cough.   Cardiovascular:  Negative for chest pain.  Gastrointestinal:  Negative for abdominal pain and diarrhea.  Genitourinary:  Negative for frequency and hematuria.  Musculoskeletal:  Negative for back pain.  Skin:  Negative for rash.  Neurological:  Positive for dizziness. Negative for seizures and headaches.  Psychiatric/Behavioral:  Negative for hallucinations.     Physical Exam Updated Vital Signs BP (!) 110/59 (BP Location: Left Arm)   Pulse (!) 40   Temp 97.8 F (36.6 C)   Resp 16   Ht 5\' 4"  (1.626 m)   Wt 67.3 kg  LMP 04/14/2019   SpO2 98%   BMI 25.47 kg/m  Physical Exam Vitals and nursing note reviewed.  Constitutional:      Appearance: She is well-developed.  HENT:     Head: Normocephalic.     Nose: Nose normal.  Eyes:     General: No scleral icterus.    Conjunctiva/sclera: Conjunctivae normal.  Neck:     Thyroid: No thyromegaly.  Cardiovascular:     Rate and Rhythm: Normal rate and regular rhythm.     Heart sounds: No murmur heard.    No friction rub. No gallop.  Pulmonary:     Breath sounds: No stridor. No wheezing or rales.  Chest:     Chest wall: No tenderness.  Abdominal:     General: There is no distension.      Tenderness: There is no abdominal tenderness. There is no rebound.  Musculoskeletal:        General: Normal range of motion.     Cervical back: Neck supple.  Lymphadenopathy:     Cervical: No cervical adenopathy.  Skin:    Findings: No erythema or rash.  Neurological:     Mental Status: She is alert and oriented to person, place, and time.     Motor: No abnormal muscle tone.     Coordination: Coordination normal.  Psychiatric:        Behavior: Behavior normal.     ED Results / Procedures / Treatments   Labs (all labs ordered are listed, but only abnormal results are displayed) Labs Reviewed  COMPREHENSIVE METABOLIC PANEL - Abnormal; Notable for the following components:      Result Value   Total Protein 8.2 (*)    All other components within normal limits  URINALYSIS, ROUTINE W REFLEX MICROSCOPIC - Abnormal; Notable for the following components:   Color, Urine COLORLESS (*)    Hgb urine dipstick SMALL (*)    Leukocytes,Ua SMALL (*)    All other components within normal limits  LIPID PANEL - Abnormal; Notable for the following components:   HDL 40 (*)    All other components within normal limits  I-STAT CHEM 8, ED - Abnormal; Notable for the following components:   Creatinine, Ser 0.40 (*)    Calcium, Ion 1.06 (*)    TCO2 21 (*)    All other components within normal limits  RESP PANEL BY RT-PCR (FLU A&B, COVID) ARPGX2  ETHANOL  PROTIME-INR  APTT  CBC  DIFFERENTIAL  RAPID URINE DRUG SCREEN, HOSP PERFORMED  HIV ANTIBODY (ROUTINE TESTING W REFLEX)  HEMOGLOBIN A1C  CBG MONITORING, ED  I-STAT CHEM 8, ED  POCT I-STAT, CHEM 8    EKG EKG Interpretation  Date/Time:  Tuesday May 06 2022 11:31:41 EDT Ventricular Rate:  59 PR Interval:  140 QRS Duration: 102 QT Interval:  418 QTC Calculation: 415 R Axis:   76 Text Interpretation: Sinus rhythm since last tracing no significant change Confirmed by Rolan Bucco [phone]) on 05/07/2022 11:11:47 AM  Radiology MR  BRAIN WO CONTRAST  Result Date: 05/06/2022 CLINICAL DATA:  Neuro deficit, acute, with stroke suspected. Nonspecific dizziness EXAM: MRI HEAD WITHOUT CONTRAST TECHNIQUE: Multiplanar, multiecho pulse sequences of the brain and surrounding structures were obtained without intravenous contrast. COMPARISON:  Head CT from earlier today FINDINGS: Brain: No acute infarction, hemorrhage, hydrocephalus, extra-axial collection or mass lesion. Vascular: Normal flow voids. Skull and upper cervical spine: Normal marrow signal. Sinuses/Orbits: Negative. IMPRESSION: Stable and negative brain MRI. Electronically Signed   By: Christiane Ha  Watts M.D.   On: 05/06/2022 17:41   CT ANGIO HEAD NECK W WO CM W PERF (CODE STROKE)  Result Date: 05/06/2022 CLINICAL DATA:  Neuro deficit, acute, stroke suspected EXAM: CT ANGIOGRAPHY HEAD AND NECK CT PERFUSION BRAIN TECHNIQUE: Multidetector CT imaging of the head and neck was performed using the standard protocol during bolus administration of intravenous contrast. Multiplanar CT image reconstructions and MIPs were obtained to evaluate the vascular anatomy. Carotid stenosis measurements (when applicable) are obtained utilizing NASCET criteria, using the distal internal carotid diameter as the denominator. Multiphase CT imaging of the brain was performed following IV bolus contrast injection. Subsequent parametric perfusion maps were calculated using RAPID software. RADIATION DOSE REDUCTION: This exam was performed according to the departmental dose-optimization program which includes automated exposure control, adjustment of the mA and/or kV according to patient size and/or use of iterative reconstruction technique. CONTRAST:  181mL OMNIPAQUE IOHEXOL 350 MG/ML SOLN COMPARISON:  CTA January 26, 2022. FINDINGS: CTA NECK FINDINGS Aortic arch: Great vessel origins are patent. Right carotid system: Mild atherosclerosis at the carotid bifurcation without greater than 50% stenosis. Left carotid system:  No evidence of dissection, stenosis (50% or greater) or occlusion. Vertebral arteries: Right dominant. Right vertebral artery is patent without significant stenosis. The small/non dominant left vertebral artery is occluded proximally with poor reconstitution distally. Skeleton: No acute abnormality. Other neck: No acute abnormality. Upper chest: Visualized lung apices are clear. Review of the MIP images confirms the above findings CTA HEAD FINDINGS Anterior circulation: Bilateral intracranial ICAs, MCAs, and ACAs are patent without proximal hemodynamically significant stenosis. Posterior circulation: Poor opacification of the left vertebral artery with suspected superimposed severe stenosis of the proximal intradural vertebral artery. Right intradural vertebral artery, basilar artery and bilateral posterior cerebral arteries are patent without proximal hemodynamically significant stenosis. Venous sinuses: As permitted by contrast timing, patent. CT Brain Perfusion Findings: ASPECTS: 10. CBF (<30%) Volume: 51mL Perfusion (Tmax>6.0s) volume: 96mL Mismatch Volume: 47mL Infarction Location:None identified. IMPRESSION: 1. Occluded non dominant/small proximal left vertebral artery in the neck with poor distal reconstitution and suspected superimposed severe stenosis intradurally. Findings are similar to the prior. 2. No new/interval emergent large vessel occlusion. 3. No evidence of core infarct or penumbra on the CT perfusion. Findings discussed with Dr. Quinn Axe via telephone at 11:33 a.m. Electronically Signed   By: Margaretha Sheffield M.D.   On: 05/06/2022 11:37    Procedures Procedures    Medications Ordered in ED Medications  iohexol (OMNIPAQUE) 350 MG/ML injection 100 mL (100 mLs Intravenous Contrast Given 05/06/22 1117)  ondansetron (ZOFRAN) injection 4 mg (4 mg Intravenous Given 05/06/22 1122)  HYDROmorphone (DILAUDID) injection 0.5 mg (0.5 mg Intravenous Given 05/06/22 1228)  LORazepam (ATIVAN) injection 1 mg (1  mg Intravenous Given 05/06/22 1635)    ED Course/ Medical Decision Making/ A&P  CRITICAL CARE Performed by: Milton Ferguson Total critical care time: 40 minutes Critical care time was exclusive of separately billable procedures and treating other patients. Critical care was necessary to treat or prevent imminent or life-threatening deterioration. Critical care was time spent personally by me on the following activities: development of treatment plan with patient and/or surrogate as well as nursing, discussions with consultants, evaluation of patient's response to treatment, examination of patient, obtaining history from patient or surrogate, ordering and performing treatments and interventions, ordering and review of laboratory studies, ordering and review of radiographic studies, pulse oximetry and re-evaluation of patient's condition. Patient was seen by neurology which has recommended stroke work-up and admission to  medicine                         Medical Decision Making Amount and/or Complexity of Data Reviewed Labs: ordered. Radiology: ordered.  Risk Prescription drug management. Decision regarding hospitalization.    This patient presents to the ED for concern of headache and dizziness, this involves an extensive number of treatment options, and is a complaint that carries with it a high risk of complications and morbidity.  The differential diagnosis includes vertigo or stroke   Co morbidities that complicate the patient evaluation  History of stroke   Additional history obtained:  Additional history obtained from family External records from outside source obtained and reviewed including hospital record   Lab Tests:  I Ordered, and personally interpreted labs.  The pertinent results include: CBC and chemistries unremarkable   Imaging Studies ordered:  I ordered imaging studies including CT head negative CT angio of head I independently visualized and interpreted  imaging which showed CT head shows no acute I agree with the radiologist interpretation   Cardiac Monitoring: / EKG:  The patient was maintained on a cardiac monitor.  I personally viewed and interpreted the cardiac monitored which showed an underlying rhythm of: Normal sinus rhythm   Consultations Obtained:  I requested consultation with the neurology and hospitalist,  and discussed lab and imaging findings as well as pertinent plan - they recommend: Admit for stroke work-up   Problem List / ED Course / Critical interventions / Medication management  Vertigo headache stroke I ordered medication including Antivert for vertigo Reevaluation of the patient after these medicines showed that the patient improved I have reviewed the patients home medicines and have made adjustments as needed   Social Determinants of Health:  None   Test / Admission - Considered:  No additional test needed   Vertigo and headache with history of stroke.  She will be admitted to medicine        Final Clinical Impression(s) / ED Diagnoses Final diagnoses:  Vertigo  History of CVA (cerebrovascular accident)  Stroke-like symptom    Rx / DC Orders ED Discharge Orders          Ordered    pantoprazole (PROTONIX) 40 MG tablet  Daily        05/07/22 1240    aspirin EC 81 MG tablet  Daily        05/07/22 1240    fluticasone (FLONASE) 50 MCG/ACT nasal spray  Daily        05/07/22 1240    cefdinir (OMNICEF) 300 MG capsule  Every 12 hours        05/07/22 1240    clopidogrel (PLAVIX) 75 MG tablet  Daily        05/07/22 1240    Ambulatory referral to Neurology       Comments: An appointment is requested in approximately: 2 weeks   05/07/22 1253              Bethann Berkshire, MD 05/08/22 1117

## 2022-06-11 ENCOUNTER — Encounter: Payer: Self-pay | Admitting: Neurology

## 2022-06-11 ENCOUNTER — Ambulatory Visit (INDEPENDENT_AMBULATORY_CARE_PROVIDER_SITE_OTHER): Payer: 59 | Admitting: Neurology

## 2022-06-11 VITALS — BP 146/83 | HR 50 | Ht 64.0 in | Wt 146.0 lb

## 2022-06-11 DIAGNOSIS — M5416 Radiculopathy, lumbar region: Secondary | ICD-10-CM

## 2022-06-11 DIAGNOSIS — Z8673 Personal history of transient ischemic attack (TIA), and cerebral infarction without residual deficits: Secondary | ICD-10-CM | POA: Diagnosis not present

## 2022-06-11 DIAGNOSIS — R42 Dizziness and giddiness: Secondary | ICD-10-CM

## 2022-06-11 DIAGNOSIS — M5442 Lumbago with sciatica, left side: Secondary | ICD-10-CM | POA: Diagnosis not present

## 2022-06-11 MED ORDER — CYCLOBENZAPRINE HCL 5 MG PO TABS: 5.0000 mg | ORAL_TABLET | Freq: Three times a day (TID) | ORAL | 1 refills | Status: AC | PRN

## 2022-06-11 MED ORDER — PREGABALIN 50 MG PO CAPS: 50.0000 mg | ORAL_CAPSULE | Freq: Three times a day (TID) | ORAL | 2 refills | Status: DC

## 2022-06-11 NOTE — Patient Instructions (Addendum)
I had a long discussion with the patient regarding her recent hospitalization for dizziness which remains of unclear etiology and neurovascular imaging was unremarkable hence I recommend referral to ENT for further evaluation.  She is also complaining of acute back pain and left leg sciatica which needs further evaluation.  Recommend Flexeril 5 mg twice daily and Lyrica 50 mg 3 times daily and check MRI scan of the lumbar spine.  I advised her to use local heat application to her back and to avoid bending activities.  Continue aspirin for stroke prevention and discontinue Plavix now and maintain aggressive risk factor modification with strict control of hypertension with blood pressure goal below 130/90, lipids with LDL cholesterol goal below 70 mg percent and diabetes with hemoglobin A1c goal below 6.5%.  She will return for follow-up in the future in 3 months with my nurse practitioner or call earlier if necessary.  Sciatica  Sciatica is pain, numbness, weakness, or tingling along the path of the sciatic nerve. The sciatic nerve starts in the lower back and runs down the back of each leg. The nerve controls the muscles in the lower leg and in the back of the knee. It also provides feeling (sensation) to the back of the thigh, the lower leg, and the sole of the foot. Sciatica is a symptom of another medical condition that pinches or puts pressure on the sciatic nerve. Sciatica most often only affects one side of the body. Sciatica usually goes away on its own or with treatment. In some cases, sciatica may come back (recur). What are the causes? This condition is caused by pressure on the sciatic nerve or pinching of the nerve. This may be the result of: A disk in between the bones of the spine bulging out too far (herniated disk). Age-related changes in the spinal disks. A pain disorder that affects a muscle in the buttock. Extra bone growth near the sciatic nerve. A break (fracture) of the  pelvis. Pregnancy. Tumor. This is rare. What increases the risk? The following factors may make you more likely to develop this condition: Playing sports that place pressure or stress on the spine. Having poor strength and flexibility. A history of back injury or surgery. Sitting for long periods of time. Doing activities that involve repetitive bending or lifting. Obesity. What are the signs or symptoms? Symptoms can vary from mild to very severe. They may include: Any of the following problems in the lower back, leg, hip, or buttock: Mild tingling, numbness, or dull aches. Burning sensations. Sharp pains. Numbness in the back of the calf or the sole of the foot. Leg weakness. Severe back pain that makes movement difficult. Symptoms may get worse when you cough, sneeze, or laugh, or when you sit or stand for long periods of time. How is this diagnosed? This condition may be diagnosed based on: Your symptoms and medical history. A physical exam. Blood tests. Imaging tests, such as: X-rays. An MRI. A CT scan. How is this treated? In many cases, this condition improves on its own without treatment. However, treatment may include: Reducing or modifying physical activity. Exercising, including strengthening and stretching. Icing and applying heat to the affected area. Medicines that help to: Relieve pain and swelling. Relax your muscles. Injections of medicines that help to relieve pain and inflammation (steroids) around the sciatic nerve. Surgery. Follow these instructions at home: Medicines Take over-the-counter and prescription medicines only as told by your health care provider. Ask your health care provider if the  medicine prescribed to you requires you to avoid driving or using heavy machinery. Managing pain     If directed, put ice on the affected area. To do this: Put ice in a plastic bag. Place a towel between your skin and the bag. Leave the ice on for 20  minutes, 2-3 times a day. If your skin turns bright red, remove the ice right away to prevent skin damage. The risk of skin damage is higher if you cannot feel pain, heat, or cold. If directed, apply heat to the affected area as often as told by your health care provider. Use the heat source that your health care provider recommends, such as a moist heat pack or a heating pad. Place a towel between your skin and the heat source. Leave the heat on for 20-30 minutes. If your skin turns bright red, remove the heat right away to prevent burns. The risk of burns is higher if you cannot feel pain, heat, or cold. Activity  Return to your normal activities as told by your health care provider. Ask your health care provider what activities are safe for you. Avoid activities that make your symptoms worse. Take brief periods of rest throughout the day. When you rest for longer periods, mix in some mild activity or stretching between periods of rest. This will help to prevent stiffness and pain. Avoid sitting for long periods of time without moving. Get up and move around at least one time each hour. Exercise and stretch regularly as told by your health care provider. Do not lift anything that is heavier than 10 lb (4.5 kg) until your health care provider says that it is safe. When you do not have symptoms, you should still avoid heavy lifting, especially repetitive heavy lifting. When you lift objects, always use proper lifting technique, which includes: Bending your knees. Keeping the load close to your body. Avoiding twisting. General instructions Maintain a healthy weight. Excess weight puts extra stress on your back. Wear supportive, comfortable shoes. Avoid wearing high heels. Avoid sleeping on a mattress that is too soft or too hard. A mattress that is firm enough to support your back when you sleep may help to reduce your pain. Contact a health care provider if: Your pain is not controlled by  medicine. Your pain does not improve or gets worse. Your pain lasts longer than 4 weeks. You have unexplained weight loss. Get help right away if: You are not able to control when you urinate or have bowel movements (incontinence). You have: Weakness in your lower back, pelvis, buttocks, or legs that gets worse. Redness or swelling of your back. A burning sensation when you urinate. Summary Sciatica is pain, numbness, weakness, or tingling along the path of the sciatic nerve, which may include the lower back, legs, hips, and buttocks. This condition is caused by pressure on the sciatic nerve or pinching of the nerve. Treatment often includes rest, exercise, medicines, and applying ice or heat. This information is not intended to replace advice given to you by your health care provider. Make sure you discuss any questions you have with your health care provider. Document Revised: 01/13/2022 Document Reviewed: 01/13/2022 Elsevier Patient Education  2023 [employer].

## 2022-06-11 NOTE — Progress Notes (Signed)
[facility] [address] [place]. [place] [address] [phone]       OFFICE FOLLOW-UP VISIT NOTE  Kristy Abbott Date of Birth:  [birth year] Medical Record Number:  [id number]   Referring MD: Jetty Duhamel  Reason for Referral: Stroke  HPI: Initial visit 04/01/2022 Kristy Abbott is a 56 year old pleasant Caucasian lady seen today for initial office consultation visit for stroke.  History is obtained from patient and review of electronic medical records and I have personally reviewed pertinent available imaging films in PACS.  She has past medical history of hypothyroidism, Graves' disease and anemia.  She presented initially on 01/23/2021 with episode of dizziness at that time the CT angiogram showed left vertebral origin severe stenosis in the distal vertebral was filling very well without any decreased flow and this was treated conservatively.  She presented this time on 01/27/2022 to [facility] emergency room complaining of dizziness when she bent over.  She was dizzy off balance and the room was spinning and felt lightheaded and had to catch herself on the windowsill.  She also felt nauseous and took some Zofran which seemed to help.  She had some blurred vision and pain in the neck and the back on the left side.  She also stated she had somewhat similar episode 6 months ago which was transient.  Emergent CT scan of the head showed no acute abnormality but CT angiogram of the head and neck showed long segment narrowing of multifocal signal loss of left vertebral artery in the neck with a patent basilar artery.  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  LDL cholesterol 88 mg percent.  Hemoglobin A1c was 5.2.  Echocardiogram showed normal ejection fraction.  Patient was treated conservatively after doing aDiagnostic cerebral catheter angiogram shows occluded left vertebral artery at its  origin with some distal reconstitution through muscular collaterals.  Retrograde filling of the terminal left vertebral artery up to the PICA from the right vertebral.  She was on IV heparin for a few days and then transition to dual antiplatelet therapy.  She returned to the emergency room on 02/13/2022 with left-sided weakness numbness and scale of 8 but given recent stroke was not administered TNK and treated conservatively as repeat MRI scan did not show an acute infarct.  Patient's states that she is doing well though she feels tired and has some difficulty sleeping.  She has been having chronic neck pain for months and does not at times radiate down her spine to.  She has history of bulging disc but has not had any recent cervical spine imaging.  Occasionally she describes shooting pain down the spine.  She states she has very occasional dizzy spells now and feels lightheaded but they are not as bad.  She has had no falls or injuries these episodes are very transient.  She denies any difficulty in coordination walking or balance.  She is tolerating aspirin and Plavix together quite well without any bleeding and only minor bruising.  She is tolerating Crestor but complains of myalgias.  Her blood pressures under good control today it is 130/80. Update 06/11/2022 : She returns for follow-up after last visit with Shanda Bumps nurse practitioner on 04/01/2022.  Patient states she was admitted to [facility] on 05/07/2019.  When she presented with sudden onset of nausea, vomiting, headache as well as bilateral and would like a referral to ENT.  CT head  was unremarkable without acute abnormality and CT angiogram showed stable appearance of previously occluded proximal left vertebral artery in the neck with poor distal reconstitution.  MRI scan of the brain shows no acute infarct.  LDL cholesterol 61 mg percent.  Hemoglobin A1c was 4.8.  Patient was on aspirin but Plavix was added.  She is tolerating this well  without bleeding but does complain of excessive bruising.  She was found to have otitis media in the right ear and started on antibiotics but her primary care physician Dr. Clelia Croft has looked into the ears prior to hospital admission and even afterwards did not find anything wrong.  She has noticed improvement but still has ear pain and would like referral to ENT specialist.  He also has new complaint of low back pain and left leg.  This radiates down back of her buttocks onto the side of the left upper thigh and leg.  It is intermittent and she has to walk with a limp.  She has not tried any pain medications and any prior history of recent stroke or TIA-like symptoms.  She is tolerating Crestor well without side effects.  She has not had any x-ray or MRI scan of her back yet.  Not tried any muscle relaxants yet. ROS:   14 system review of systems is positive for dizziness, lightheadedness, imbalance all other systems negative  PMH:  Past Medical History:  Diagnosis Date   Anemia    Graves disease    Hypothyroidism     Social History:  Social History   Socioeconomic History   Marital status: Married    Spouse name: Not on file   Number of children: Not on file   Years of education: Not on file   Highest education level: Not on file  Occupational History   Not on file  Tobacco Use   Smoking status: Former    Packs/day: 1.00    Types: Cigarettes    Start date: 06/07/1981    Quit date: 10/20/1998    Years since quitting: 23.6   Smokeless tobacco: Never  Vaping Use   Vaping Use: Never used  Substance and Sexual Activity   Alcohol use: No   Drug use: No   Sexual activity: Yes  Other Topics Concern   Not on file  Social History Narrative   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   Social Determinants of Health   Financial Resource Strain: Not on file  Food Insecurity: Not on file  Transportation Needs: Not on file  Physical Activity:  Not on file  Stress: Not on file  Social Connections: Not on file  Intimate Partner Violence: Not on file    Medications:   Current Outpatient Medications on File Prior to Visit  Medication Sig Dispense Refill   acetaminophen (TYLENOL) 325 MG tablet Take 2 tablets (650 mg total) by mouth every 6 (six) hours as needed for mild pain (or Fever >/= 101).     aspirin EC 81 MG tablet Take 1 tablet (81 mg total) by mouth daily. Swallow whole.     cetirizine (ZYRTEC ALLERGY) 10 MG tablet Take 1 tablet (10 mg total) by mouth daily. 30 tablet 0   clopidogrel (PLAVIX) 75 MG tablet Take 1 tablet (75 mg total) by mouth daily. 30 tablet 2   Ferrous Sulfate 142 (45 Fe) MG TBCR Take 45 mg by mouth every other day.     fluticasone (FLONASE) 50 MCG/ACT nasal  spray Place 2 sprays into both nostrils daily. 9.9 mL 0   levothyroxine (SYNTHROID, LEVOTHROID) 88 MCG tablet Take 88 mcg by mouth daily before breakfast.     meclizine (ANTIVERT) 25 MG tablet Take 1 tablet (25 mg total) by mouth 3 (three) times daily as needed for dizziness. 15 tablet 0   pantoprazole (PROTONIX) 40 MG tablet Take 1 tablet (40 mg total) by mouth daily. 30 tablet 2   rosuvastatin (CRESTOR) 20 MG tablet Take 1 tablet (20 mg total) by mouth daily. 30 tablet 1   vitamin B-12 (CYANOCOBALAMIN) 500 MCG tablet Take 500 mcg by mouth daily.     No current facility-administered medications on file prior to visit.    Allergies:  No Known Allergies  Physical Exam General: well developed, well nourished, middle-aged Caucasian lady seated, in no evident distress Head: head normocephalic and atraumatic.   Neck: supple with no carotid or supraclavicular bruits Cardiovascular: regular rate and rhythm, no murmurs Musculoskeletal: no deformity mild tenderness left lower paraspinal lumbar region.  Straight leg raising test positive on the left at 45 degrees Skin:  no rash/petichiae Vascular:  Normal pulses all extremities  Neurologic Exam Mental  Status: Awake and fully alert. Oriented to place and time. Recent and remote memory intact. Attention span, concentration and fund of knowledge appropriate. Mood and affect appropriate.  Cranial Nerves: Fundoscopic exam not done. Pupils equal, briskly reactive to light. Extraocular movements full without nystagmus. Visual fields full to confrontation. Hearing intact. Facial sensation intact. Face, tongue, palate moves normally and symmetrically.  Motor: Normal bulk and tone. Normal strength in all tested extremity muscles. Sensory.: intact to touch , pinprick , position and vibratory sensation.  Coordination: Rapid alternating movements normal in all extremities. Finger-to-nose and heel-to-shin performed accurately bilaterally. Gait and Station: Arises from chair without difficulty. Stance is normal. Gait demonstrates normal stride length and balance . Able to heel, toe and tandem walk without difficulty.  Reflexes: 1+ and symmetric. Toes downgoing.       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  She is doing clinically quite well with very minimal symptoms.  Vascular risk factors of hyperlipidemia and vertebral artery occlusion.  New complaints of bilateral ear pain and dizziness of unclear etiology with negative neurovascular imaging.  New complaints of back pain and left leg sciatica likely due to lumbar radiculopathy     PLAN:I had a long discussion with the patient regarding her recent hospitalization for dizziness which remains of unclear etiology and neurovascular imaging was unremarkable hence I recommend referral to ENT for further evaluation.  She is also complaining of acute back pain and left leg sciatica which needs further evaluation.  Recommend Flexeril 5 mg twice daily and Lyrica 50 mg 3 times daily and check MRI scan of the lumbar spine.  I  advised her to use local heat application to her back and to avoid bending activities.  Continue aspirin for stroke prevention and discontinue Plavix now and maintain aggressive risk factor modification with strict control of hypertension with blood pressure goal below 130/90, lipids with LDL cholesterol goal below 70 mg percent and diabetes with hemoglobin A1c goal below 6.5%.  She will return for follow-up in the future in 3 months with my nurse practitioner or call earlier if necessary.Greater than 50% time during this 45-minute prolonged visit were spent in coordination of care about her vertebral artery stenosis and cerebellar  stroke , back pain and sciatica ear pain and dizziness discussion about stroke prevention and treatment and answering questions.  Delia Heady MD Note: This document was prepared with digital dictation and possible smart phrase technology. Any transcriptional errors that result from this process are unintentional.

## 2022-06-12 ENCOUNTER — Telehealth: Payer: Self-pay | Admitting: Neurology

## 2022-06-12 NOTE — Telephone Encounter (Signed)
Referral sent to [facility] [phone] 

## 2022-06-17 ENCOUNTER — Telehealth: Payer: Self-pay | Admitting: Neurology

## 2022-06-17 NOTE — Telephone Encounter (Signed)
[employer] [facility] Harvey: [id number] exp. 06/17/22-08/01/22 sent to GI

## 2022-06-30 ENCOUNTER — Ambulatory Visit
Admission: RE | Admit: 2022-06-30 | Discharge: 2022-06-30 | Disposition: A | Discharge status: Home / Self Care | Payer: 59 | Source: Ambulatory Visit | Attending: Neurology | Admitting: Neurology

## 2022-06-30 DIAGNOSIS — M5442 Lumbago with sciatica, left side: Secondary | ICD-10-CM | POA: Diagnosis not present

## 2022-06-30 DIAGNOSIS — M5416 Radiculopathy, lumbar region: Secondary | ICD-10-CM | POA: Diagnosis not present

## 2022-07-02 NOTE — Progress Notes (Signed)
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

## 2022-09-18 ENCOUNTER — Ambulatory Visit: Payer: 59 | Admitting: Neurology

## 2022-09-18 ENCOUNTER — Encounter: Payer: Self-pay | Admitting: Neurology

## 2022-09-18 VITALS — BP 119/76 | HR 54 | Ht 64.0 in | Wt 148.0 lb

## 2022-09-18 DIAGNOSIS — I6509 Occlusion and stenosis of unspecified vertebral artery: Secondary | ICD-10-CM

## 2022-09-18 DIAGNOSIS — I63219 Cerebral infarction due to unspecified occlusion or stenosis of unspecified vertebral arteries: Secondary | ICD-10-CM | POA: Diagnosis not present

## 2022-09-18 DIAGNOSIS — E782 Mixed hyperlipidemia: Secondary | ICD-10-CM | POA: Diagnosis not present

## 2022-09-18 NOTE — Patient Instructions (Signed)
Great to see you today! Goal is BP < 130/90, A1C < 7.0, LDL < 70 Continue to see your primary care for secondary stroke prevention  See you back as needed

## 2022-09-18 NOTE — Progress Notes (Signed)
Patient: Kristy Abbott Date of Birth: 05-05-1966  Reason for Visit: Follow up History from: Patient, husband Primary Neurologist: Dr. Pearlean Brownie   ASSESSMENT AND PLAN 56 y.o. year old female   1.  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  Hyperlipidemia -Remains on Crestor, PCP follows labs -Goal LDL less than 70, was 61 in July 2023  3.  Dizziness, bilateral ear pain -Symptoms essentially resolved -Has seen ENT, workup was unrevealing  4.  Back pain, left leg sciatica -Resolved, MRI lumbar spine showed mild degenerative changes  She will continue close follow-up with PCP, return here on an as-needed basis.  For any acute stroke symptoms she should go to the ER  HISTORY  HPI: Initial visit 04/01/2022 Kristy Abbott is a 55 year old pleasant Caucasian lady seen today for initial office consultation visit for stroke.  History is obtained from patient and review of electronic medical records and I have personally reviewed pertinent available imaging films in PACS.  She has past medical history of hypothyroidism, Graves' disease and anemia.  She presented initially on 01/23/2021 with episode of dizziness at that time the CT angiogram showed left vertebral origin severe stenosis in the distal vertebral was filling very well without any decreased flow and this was treated conservatively.  She presented this time on 01/27/2022 to [facility] emergency room complaining of dizziness when she bent over.  She was dizzy off balance and the room was spinning and felt lightheaded and had to catch herself on the windowsill.  She also felt nauseous and took some Zofran which seemed to help.  She had some blurred vision and pain in the neck and the  back on the left side.  She also stated she had somewhat similar episode 6 months ago which was transient.  Emergent CT scan of the head showed no acute abnormality but CT angiogram of the head and neck showed long segment narrowing of multifocal signal loss of left vertebral artery in the neck with a patent basilar artery.  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  LDL cholesterol 88 mg percent.  Hemoglobin A1c was 5.2.  Echocardiogram showed normal ejection fraction.  Patient was treated conservatively after doing aDiagnostic cerebral catheter angiogram shows occluded left vertebral artery at its origin with some distal reconstitution through muscular collaterals.  Retrograde filling of the terminal left vertebral artery up to the PICA from the right vertebral.  She was on IV heparin for a few days and then transition to dual antiplatelet therapy.  She returned to the emergency room on 02/13/2022 with left-sided weakness numbness and scale of 8 but given recent stroke was not administered TNK and treated conservatively as repeat MRI scan did not show an acute infarct.  Patient's states that she is doing well though she feels tired and has some difficulty sleeping.  She has been having chronic neck pain for months and does not at times radiate down her spine to.  She has history of bulging disc but has not had any recent cervical spine imaging.  Occasionally she describes shooting pain down the spine.  She states she has very occasional dizzy spells now and feels lightheaded but they are not as bad.  She has had no falls or injuries these episodes are very transient.  She denies any difficulty in coordination walking or balance.  She is tolerating aspirin and Plavix together quite well without any bleeding and only minor bruising.  She is tolerating Crestor but complains of myalgias.  Her blood pressures under good control today it is  130/80. Update 06/11/2022 : She returns for follow-up after last visit with Kristy BumpsJessica nurse practitioner on 04/01/2022.  Patient states she was admitted to [facility] on 05/07/2019.  When she presented with sudden onset of nausea, vomiting, headache as well as bilateral and would like a referral to ENT.  CT head was unremarkable without acute abnormality and CT angiogram showed stable appearance of previously occluded proximal left vertebral artery in the neck with poor distal reconstitution.  MRI scan of the brain shows no acute infarct.  LDL cholesterol 61 mg percent.  Hemoglobin A1c was 4.8.  Patient was on aspirin but Plavix was added.  She is tolerating this well without bleeding but does complain of excessive bruising.  She was found to have otitis media in the right ear and started on antibiotics but her primary care physician Dr. Clelia CroftShaw has looked into the ears prior to Abbott admission and even afterwards did not find anything wrong.  She has noticed improvement but still has ear pain and would like referral to ENT specialist.  He also has new complaint of low back pain and left leg.  This radiates down back of her buttocks onto the side of the left upper thigh and leg.  It is intermittent and she has to walk with a limp.  She has not tried any pain medications and any prior history of recent stroke or TIA-like symptoms.  She is tolerating Crestor well without side effects.  She has not had any x-ray or MRI scan of her back yet.  Not tried any muscle relaxants yet.  Today, September 18, 2022 SS: Had MRI of the lumbar spine 06/30/22 that only showed mild degenerative changes.  Saw ENT 08/22/22 referred [facility], recommended audiogram, Flonase.  Audiology evaluation was normal. Here with husband, doing well. Less often pressure in back of her head, with stress, takes Tylenol or Flexeril if needed. Her back is better, no longer taking the Lyrica. Remains on  aspirin 81 mg daily. On Crestor. BP 119/76.reports PCP checked cholesterol LDL was 64.  She works at [employer].  REVIEW OF SYSTEMS: Out of a complete 14 system review of symptoms, the patient complains only of the following symptoms, and all other reviewed systems are negative.  See HPI  ALLERGIES: No Known Allergies  HOME MEDICATIONS: Outpatient Medications Prior to Visit  Medication Sig Dispense Refill   acetaminophen (TYLENOL) 325 MG tablet Take 2 tablets (650 mg total) by mouth every 6 (six) hours as needed for mild pain (or Fever >/= 101).     aspirin EC 81 MG tablet Take 81 mg by mouth daily. Swallow whole.     cetirizine (ZYRTEC ALLERGY) 10 MG tablet Take 1 tablet (10 mg total) by mouth daily. 30 tablet 0   cyclobenzaprine (FLEXERIL) 5 MG tablet Take 1 tablet (5 mg total) by mouth every 8 (eight) hours as needed for muscle spasms. 30 tablet 1   Ferrous Sulfate 142 (45 Fe) MG TBCR Take 45 mg by mouth every other day.     fluticasone (FLONASE) 50 MCG/ACT nasal spray Place 2 sprays into both nostrils daily. 9.9  mL 0   levothyroxine (SYNTHROID, LEVOTHROID) 88 MCG tablet Take 88 mcg by mouth daily before breakfast.     meclizine (ANTIVERT) 25 MG tablet Take 1 tablet (25 mg total) by mouth 3 (three) times daily as needed for dizziness. 15 tablet 0   pantoprazole (PROTONIX) 40 MG tablet Take 1 tablet (40 mg total) by mouth daily. 30 tablet 2   pregabalin (LYRICA) 50 MG capsule Take 1 capsule (50 mg total) by mouth 3 (three) times daily. 90 capsule 2   rosuvastatin (CRESTOR) 20 MG tablet Take 1 tablet (20 mg total) by mouth daily. 30 tablet 1   vitamin B-12 (CYANOCOBALAMIN) 500 MCG tablet Take 500 mcg by mouth daily.     clopidogrel (PLAVIX) 75 MG tablet Take 1 tablet (75 mg total) by mouth daily. 30 tablet 2   No facility-administered medications prior to visit.    PAST MEDICAL HISTORY: Past Medical History:  Diagnosis Date   Anemia    Graves disease    Hypothyroidism     PAST  SURGICAL HISTORY: Past Surgical History:  Procedure Laterality Date   CESAREAN SECTION     COLONOSCOPY N/A 11/10/2018   Procedure: COLONOSCOPY;  Surgeon: West Bali, MD;  Location: [facility];  Service: Endoscopy;  Laterality: N/A;  1:30   Cyst removed from neck     IR ANGIO INTRA EXTRACRAN SEL COM CAROTID INNOMINATE BILAT MOD SED  01/28/2022   IR ANGIO VERTEBRAL SEL SUBCLAVIAN INNOMINATE UNI L MOD SED  01/28/2022   IR ANGIO VERTEBRAL SEL VERTEBRAL UNI R MOD SED  01/28/2022   IR US GUIDE VASC ACCESS RIGHT  01/28/2022   TOTAL ABDOMINAL HYSTERECTOMY  04/07/2019   Dr. Ernestina Penna   FAMILY HISTORY: Family History  Problem Relation Age of Onset   CAD Mother    Diabetes Mother    Colon cancer Neg Hx    Colon polyps Neg Hx     SOCIAL HISTORY: Social History   Socioeconomic History   Marital status: Married    Spouse name: Not on file   Number of children: Not on file   Years of education: Not on file   Highest education level: Not on file  Occupational History   Not on file  Tobacco Use   Smoking status: Former    Packs/day: 1.00    Types: Cigarettes    Start date: 06/07/1981    Quit date: 10/20/1998    Years since quitting: 23.9   Smokeless tobacco: Never  Vaping Use   Vaping Use: Never used  Substance and Sexual Activity   Alcohol use: No   Drug use: No   Sexual activity: Yes  Other Topics Concern   Not on file  Social History Narrative   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   Social Determinants of Health   Financial Resource Strain: Not on file  Food Insecurity: Not on file  Transportation Needs: Not on file  Physical Activity: Not on file  Stress: Not on file  Social Connections: Not on file  Intimate Partner Violence: Not on file   PHYSICAL EXAM  Vitals:   09/18/22 0826  BP: 119/76  Pulse: (!) 54  Weight: 148 lb (67.1 kg)  Height: 5\' 4"  (1.626 m)   Body mass index is 25.4 kg/m.  Generalized:  Well developed, in no acute distress  Neurological examination  Mentation: Alert oriented to time, place, history taking. Follows all commands speech and language  fluent Cranial nerve II-XII: Pupils were equal round reactive to light. Extraocular movements were full, visual field were full on confrontational test. Facial sensation and strength were normal. Head turning and shoulder shrug were normal and symmetric. Motor: The motor testing reveals 5 over 5 strength of all 4 extremities. Good symmetric motor tone is noted throughout.  Sensory: Sensory testing is intact to soft touch on all 4 extremities. No evidence of extinction is noted.  Coordination: Cerebellar testing reveals good finger-nose-finger and heel-to-shin bilaterally.  Gait and station: Gait is normal. Tandem gait is normal.  Reflexes: Deep tendon reflexes are symmetric and normal bilaterally.   DIAGNOSTIC DATA (LABS, IMAGING, TESTING) - I reviewed patient records, labs, notes, testing and imaging myself where available.  Lab Results  Component Value Date   WBC 5.0 05/06/2022   HGB 12.6 05/06/2022   HCT 37.0 05/06/2022   MCV 94.5 05/06/2022   PLT 238 05/06/2022      Component Value Date/Time   NA 135 05/06/2022 1136   K 4.3 05/06/2022 1136   CL 102 05/06/2022 1136   CO2 24 05/06/2022 1108   GLUCOSE 83 05/06/2022 1136   BUN 14 05/06/2022 1136   CREATININE 0.40 (L) 05/06/2022 1136   CALCIUM 9.6 05/06/2022 1108   PROT 8.2 (H) 05/06/2022 1108   ALBUMIN 4.9 05/06/2022 1108   AST 20 05/06/2022 1108   ALT 19 05/06/2022 1108   ALKPHOS 62 05/06/2022 1108   BILITOT 1.0 05/06/2022 1108   GFRNONAA >60 05/06/2022 1108   GFRAA >90 01/19/2014 1921   Lab Results  Component Value Date   CHOL 115 05/07/2022   HDL 40 (L) 05/07/2022   LDLCALC 61 05/07/2022   TRIG 68 05/07/2022   CHOLHDL 2.9 05/07/2022   Lab Results  Component Value Date   HGBA1C 4.8 05/07/2022   No results found for: "VITAMINB12" No results found for:  "TSH"  Margie Ege, Edrick Oh, DNP 09/18/2022, 8:33 AM [facility] [address], [address] [place], [place] [address] [phone]

## 2022-09-18 NOTE — Progress Notes (Signed)
I agree with the above plan 

## 2022-10-01 ENCOUNTER — Ambulatory Visit: Payer: 59 | Admitting: Family Medicine

## 2022-11-17 ENCOUNTER — Other Ambulatory Visit: Payer: Self-pay | Admitting: Internal Medicine

## 2022-11-17 DIAGNOSIS — Z1231 Encounter for screening mammogram for malignant neoplasm of breast: Secondary | ICD-10-CM

## 2023-01-02 ENCOUNTER — Ambulatory Visit
Admission: RE | Admit: 2023-01-02 | Discharge: 2023-01-02 | Disposition: A | Discharge status: Home / Self Care | Payer: 59 | Source: Ambulatory Visit | Attending: Internal Medicine | Admitting: Internal Medicine

## 2023-01-02 DIAGNOSIS — Z1231 Encounter for screening mammogram for malignant neoplasm of breast: Secondary | ICD-10-CM

## 2024-01-21 IMAGING — MR MR HEAD W/O CM
11 of 12 series · 42 of 48 positions shown · non-contrast
Comparison: 01/27/2022

CLINICAL DATA: Mental status change, unknown cause; left-sided
weakness and numbness

EXAM:
MRI HEAD WITHOUT CONTRAST
TECHNIQUE: Multiplanar, multiecho pulse sequences of the brain and surrounding
structures were obtained without intravenous contrast.

[Series 5: DWI · axial · 4.0mm · 0.88mm/px · z∈[-82,+54]mm · 4 of 36 slices shown (1 of 6)]
[im 1/36]
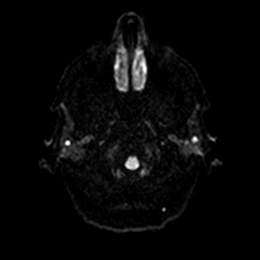
[im 12/36]
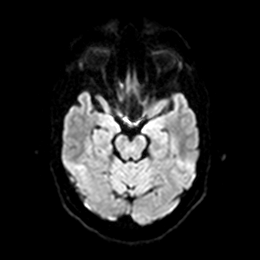
[im 24/36]
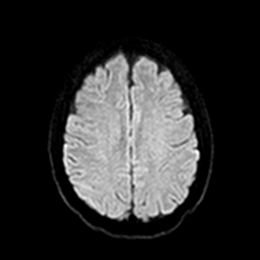
[im 36/36]
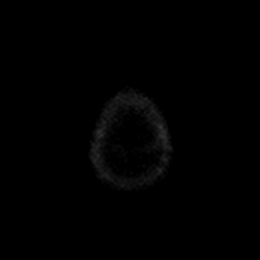

[Series 5: DWI · axial · 4.0mm · 0.88mm/px · z∈[-82,+54]mm · 4 of 36 slices shown (2 of 6)]
[im 1/36]
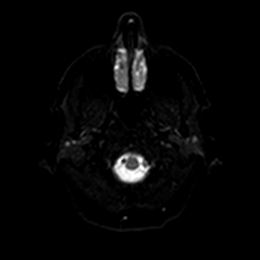
[im 12/36]
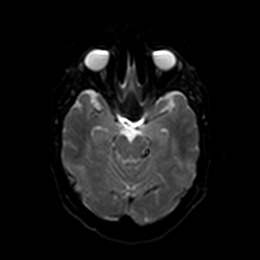
[im 24/36]
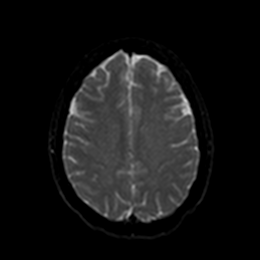
[im 36/36]
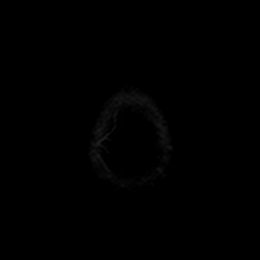

[Series 6: DWI · axial · 4.0mm · 0.88mm/px · z∈[-82,+54]mm · 4 of 36 slices shown (3 of 6)]
[im 1/36]
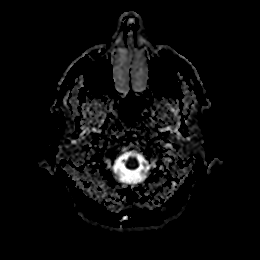
[im 12/36]
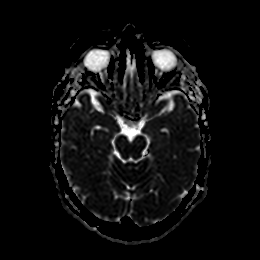
[im 24/36]
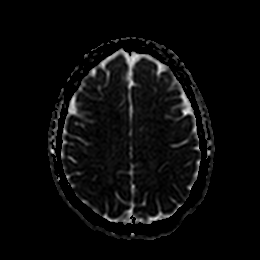
[im 36/36]
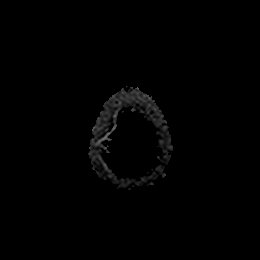

[Series 7: DWI · coronal · 5.0mm · 0.88mm/px · 4 of 28 slices shown (4 of 6)]
[im 1/28]
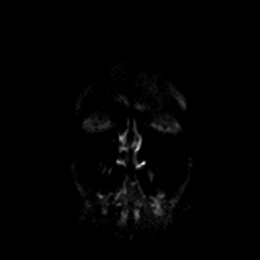
[im 10/28]
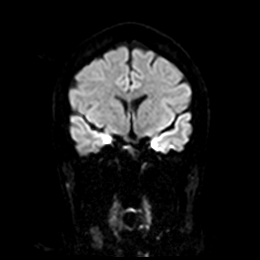
[im 19/28]
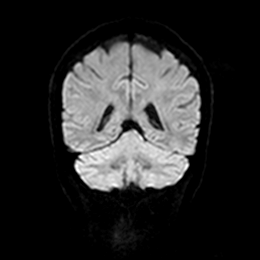
[im 28/28]
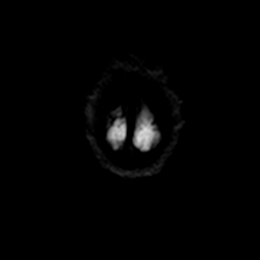

[Series 7: DWI · coronal · 5.0mm · 0.88mm/px · 4 of 28 slices shown (5 of 6)]
[im 1/28]
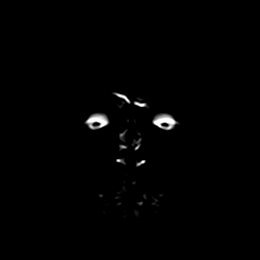
[im 10/28]
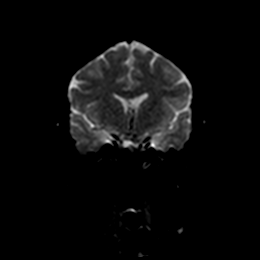
[im 19/28]
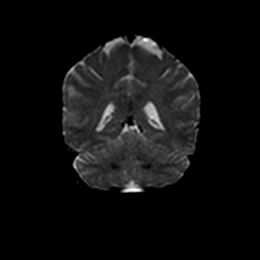
[im 28/28]
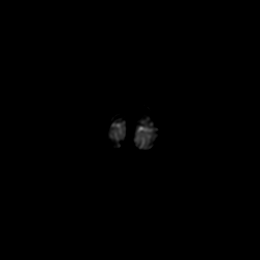

[Series 8: DWI · coronal · 5.0mm · 0.88mm/px · 4 of 28 slices shown (6 of 6)]
[im 1/28]
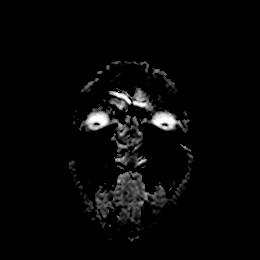
[im 10/28]
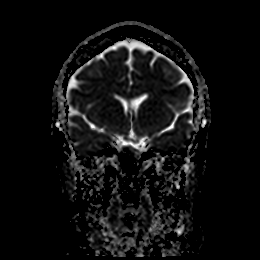
[im 19/28]
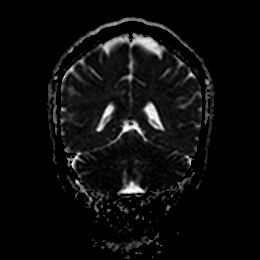
[im 28/28]
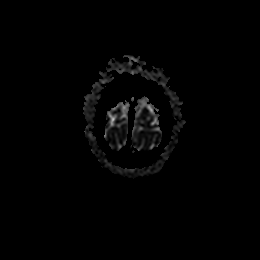

[Series 9: T1 · sagittal · 5.0mm · 0.94mm/px · 3 of 21 slices shown]
[im 1/21]
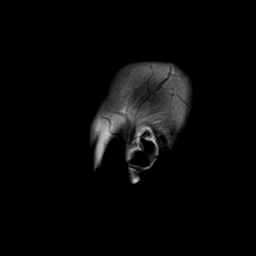
[im 11/21]
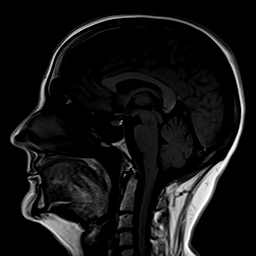
[im 21/21]
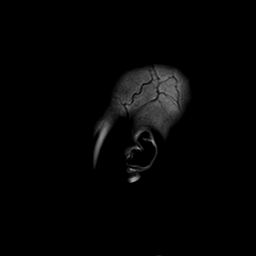

[Series 10: T2 · axial · 5.0mm · 0.72mm/px · z∈[-78,+50]mm · 3 of 20 slices shown (1 of 2)]
[im 1/20]
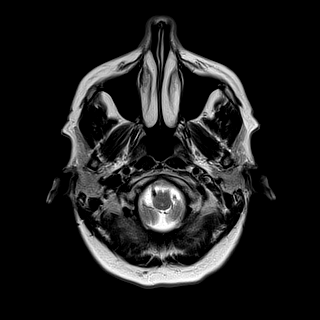
[im 10/20]
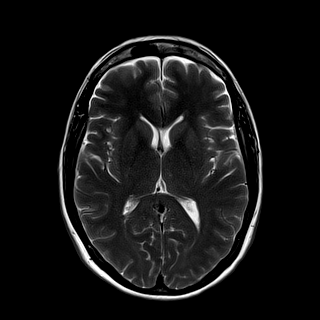
[im 20/20]
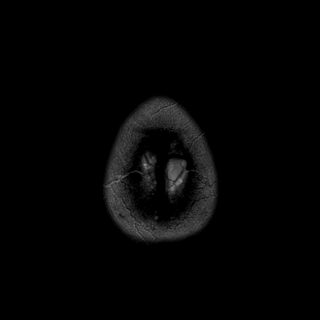

[Series 11: ax hemo · axial · 5.0mm · 0.86mm/px · z∈[-81,+58]mm · 3 of 25 slices shown]
[im 1/25]
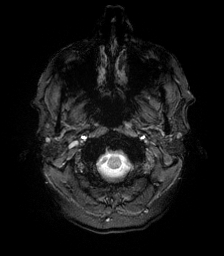
[im 13/25]
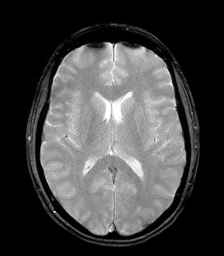
[im 25/25]
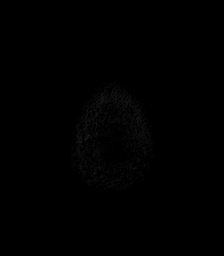

[Series 12: FLAIR · axial · 4.0mm · 0.43mm/px · z∈[-79,+56]mm · 5 of 36 slices shown]
[im 1/36]
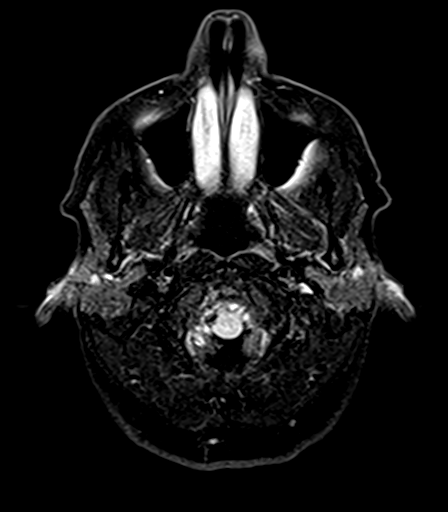
[im 9/36]
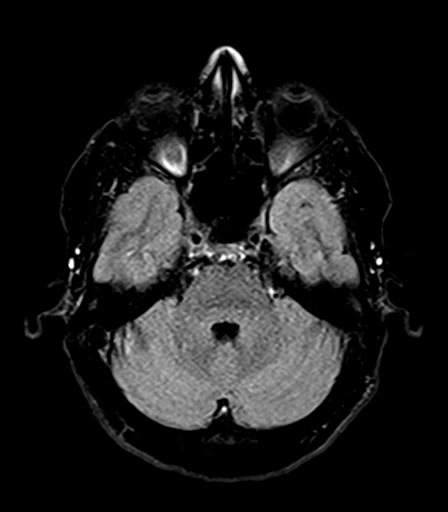
[im 18/36]
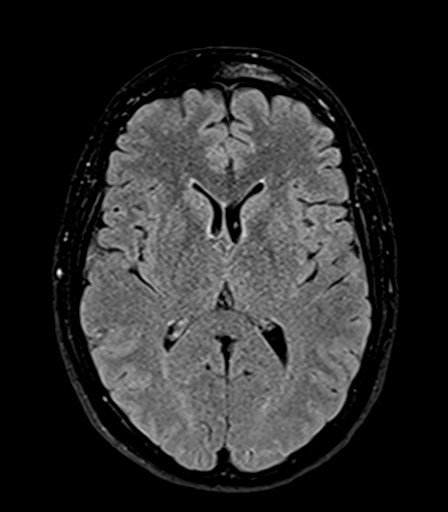
[im 27/36]
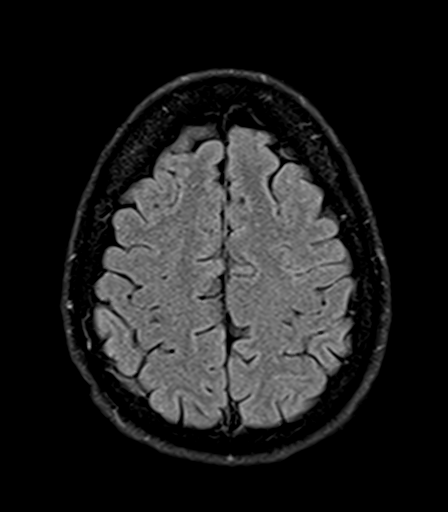
[im 36/36]
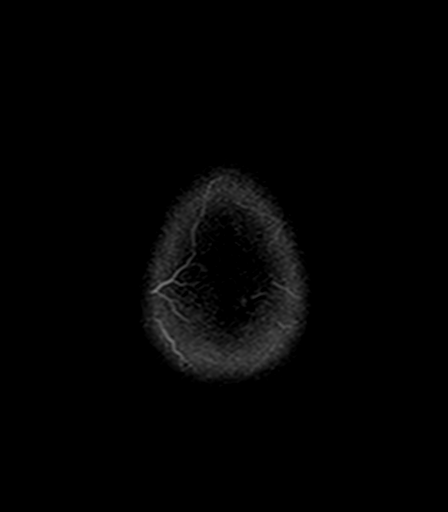

[Series 14: T2 · coronal · 5.0mm · 0.72mm/px · 4 of 27 slices shown (2 of 2)]
[im 1/27]
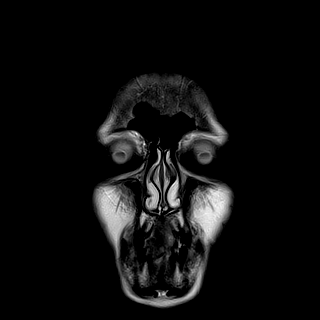
[im 9/27]
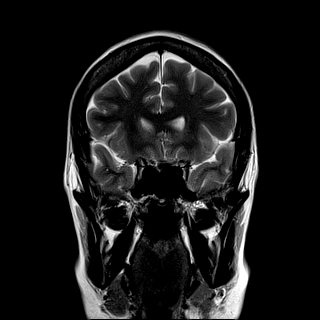
[im 18/27]
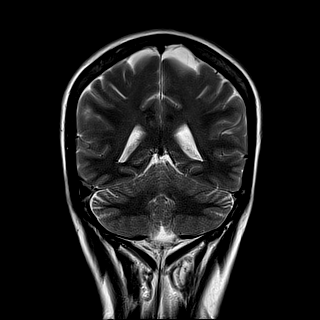
[im 27/27]
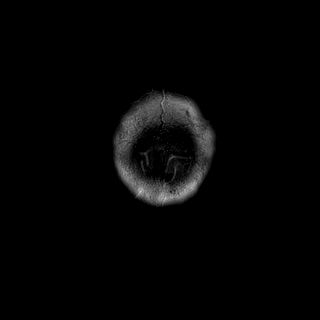

[42 of 48 positions shown; findings below may reference images not displayed]

FINDINGS: Brain: There is no acute infarction or intracranial hemorrhage.
Abnormal diffusion signal within the inferior left cerebellum on the
prior study is no longer present. There is no intracranial mass,
mass effect, or edema. There is no hydrocephalus or extra-axial
fluid collection. Ventricles and sulci are normal in size and
configuration.

Vascular: Major vessel flow voids at the skull base are preserved.

Skull and upper cervical spine: Normal marrow signal is preserved.

Sinuses/Orbits: Paranasal sinuses are aerated. Orbits are
unremarkable.

Other: Sella is unremarkable.  Mastoid air cells are clear.
IMPRESSION: No acute infarction, hemorrhage, or mass.
